# Patient Record
Sex: Male | Born: 1965 | Race: Black or African American | Hispanic: No | Marital: Married | Smoking: Never smoker
Health system: Southern US, Community
[De-identification: ages and names within clinical notes are randomized; demographics above are authoritative.]

## PROBLEM LIST (undated history)

## (undated) DIAGNOSIS — T7840XA Allergy, unspecified, initial encounter: Secondary | ICD-10-CM

## (undated) DIAGNOSIS — E785 Hyperlipidemia, unspecified: Secondary | ICD-10-CM

## (undated) DIAGNOSIS — M2550 Pain in unspecified joint: Secondary | ICD-10-CM

## (undated) DIAGNOSIS — M109 Gout, unspecified: Secondary | ICD-10-CM

## (undated) DIAGNOSIS — I10 Essential (primary) hypertension: Secondary | ICD-10-CM

## (undated) DIAGNOSIS — I509 Heart failure, unspecified: Secondary | ICD-10-CM

## (undated) HISTORY — DX: Hyperlipidemia, unspecified: E78.5

## (undated) HISTORY — DX: Heart failure, unspecified: I50.9

## (undated) HISTORY — DX: Essential (primary) hypertension: I10

## (undated) HISTORY — DX: Pain in unspecified joint: M25.50

## (undated) HISTORY — PX: TONSILLECTOMY: SHX5217

## (undated) HISTORY — DX: Allergy, unspecified, initial encounter: T78.40XA

---

## 2015-05-31 ENCOUNTER — Telehealth: Payer: Self-pay | Admitting: *Deleted

## 2015-05-31 NOTE — Telephone Encounter (Signed)
Unable to reach patient at time of Pre-Visit Call.  Left message for patient to return call when available.    

## 2015-06-01 ENCOUNTER — Encounter: Payer: Self-pay | Admitting: Family Medicine

## 2015-06-01 ENCOUNTER — Ambulatory Visit (INDEPENDENT_AMBULATORY_CARE_PROVIDER_SITE_OTHER): Payer: BC Managed Care – PPO | Admitting: Family Medicine

## 2015-06-01 VITALS — BP 138/90 | HR 97 | Temp 98.1°F | Ht 69.0 in | Wt 235.6 lb

## 2015-06-01 DIAGNOSIS — I10 Essential (primary) hypertension: Secondary | ICD-10-CM | POA: Insufficient documentation

## 2015-06-01 DIAGNOSIS — Z23 Encounter for immunization: Secondary | ICD-10-CM

## 2015-06-01 DIAGNOSIS — IMO0001 Reserved for inherently not codable concepts without codable children: Secondary | ICD-10-CM

## 2015-06-01 DIAGNOSIS — R9431 Abnormal electrocardiogram [ECG] [EKG]: Secondary | ICD-10-CM

## 2015-06-01 DIAGNOSIS — R030 Elevated blood-pressure reading, without diagnosis of hypertension: Secondary | ICD-10-CM

## 2015-06-01 DIAGNOSIS — R319 Hematuria, unspecified: Secondary | ICD-10-CM

## 2015-06-01 DIAGNOSIS — Z0000 Encounter for general adult medical examination without abnormal findings: Secondary | ICD-10-CM | POA: Insufficient documentation

## 2015-06-01 HISTORY — DX: Encounter for general adult medical examination without abnormal findings: Z00.00

## 2015-06-01 MED ORDER — LISINOPRIL-HYDROCHLOROTHIAZIDE 10-12.5 MG PO TABS: ORAL_TABLET | ORAL | Status: DC

## 2015-06-01 NOTE — Progress Notes (Signed)
Patient ID: Alfred Martin, male    DOB: 09/06/1966  Age: 49 y.o. MRN: [id number]    Subjective:  Subjective HPI Alfred Martin presents to establish -- bp has been running high at  Home.    Review of Systems  Constitutional: Negative for diaphoresis, appetite change, fatigue and unexpected weight change.  Eyes: Negative for pain, redness and visual disturbance.  Respiratory: Negative for cough, chest tightness, shortness of breath and wheezing.   Cardiovascular: Negative for chest pain, palpitations and leg swelling.  Endocrine: Negative for cold intolerance, heat intolerance, polydipsia, polyphagia and polyuria.  Genitourinary: Negative for dysuria, frequency and difficulty urinating.  Neurological: Negative for dizziness, light-headedness, numbness and headaches.    History Past Medical History  Diagnosis Date  . HTN (hypertension)     He has past surgical history that includes Tonsillectomy.   His family history includes Arthritis in his mother; Breast cancer in his maternal aunt and paternal grandmother; Colon cancer in his paternal uncle; Congestive Heart Failure in his brother and paternal uncle; Diabetes in his brother, father, and mother; Drug abuse in his brother; Heart disease in his maternal grandmother; High blood pressure in his father and mother; Hyperlipidemia in his father and mother; Hypertension in his brother, brother, father, and mother; Stroke in his paternal uncle.He reports that he has never smoked. He has never used smokeless tobacco. He reports that he does not drink alcohol or use illicit drugs.  No current outpatient prescriptions on file prior to visit.   No current facility-administered medications on file prior to visit.     Objective:  Objective Physical Exam  Constitutional: He is oriented to person, place, and time. Vital signs are normal. He appears well-developed and well-nourished. He is sleeping. No distress.  HENT:  Head: Normocephalic and atraumatic.   Right Ear: External ear normal.  Left Ear: External ear normal.  Nose: Nose normal.  Mouth/Throat: Oropharynx is clear and moist. No oropharyngeal exudate.  Eyes: Conjunctivae and EOM are normal. Pupils are equal, round, and reactive to light. Right eye exhibits no discharge. Left eye exhibits no discharge.  Neck: Normal range of motion. Neck supple. No JVD present. No thyromegaly present.  Cardiovascular: Normal rate, regular rhythm and intact distal pulses.  Exam reveals no gallop and no friction rub.   No murmur heard. Pulmonary/Chest: Effort normal and breath sounds normal. No respiratory distress. He has no wheezes. He has no rales. He exhibits no tenderness.  Abdominal: Soft. Bowel sounds are normal. He exhibits no distension and no mass. There is no tenderness. There is no rebound and no guarding.  Genitourinary:  Pt refused rectal exam  Musculoskeletal: Normal range of motion. He exhibits no edema or tenderness.  Lymphadenopathy:    He has no cervical adenopathy.  Neurological: He is alert and oriented to person, place, and time. He displays normal reflexes. He exhibits normal muscle tone.  Skin: Skin is warm and dry. No rash noted. He is not diaphoretic. No erythema. No pallor.  Psychiatric: He has a normal mood and affect. His behavior is normal. Judgment and thought content normal.   BP 138/90 mmHg  Pulse 97  Temp(Src) 98.1 F (36.7 C) (Oral)  Ht 5\' 9"  (1.753 m)  Wt 235 lb 9.6 oz (106.867 kg)  BMI 34.78 kg/m2  SpO2 98% Wt Readings from Last 3 Encounters:  06/01/15 235 lb 9.6 oz (106.867 kg)     No results found for: WBC, HGB, HCT, PLT, GLUCOSE, CHOL, TRIG, HDL, LDLDIRECT, LDLCALC, ALT, AST,  NA, K, CL, CREATININE, BUN, CO2, TSH, PSA, INR, GLUF, HGBA1C, MICROALBUR ekg- possible ischemia-- x2-- no old one to compare    Assessment & Plan:  Plan I am having Mr. Haughey start on lisinopril-hydrochlorothiazide.  Meds ordered this encounter  Medications  .  lisinopril-hydrochlorothiazide (PRINZIDE,ZESTORETIC) 10-12.5 MG per tablet    Sig: 1 po qd    Dispense:  30 tablet    Refill:  2    Problem List Items Addressed This Visit    HTN (hypertension)   Relevant Medications   lisinopril-hydrochlorothiazide (PRINZIDE,ZESTORETIC) 10-12.5 MG per tablet    Other Visit Diagnoses    Preventative health care    -  Primary    Relevant Orders    Basic metabolic panel    CBC with Differential/Platelet    Hepatic function panel    Lipid panel    POCT urinalysis dipstick    PSA    TSH    Elevated BP        Relevant Medications    lisinopril-hydrochlorothiazide (PRINZIDE,ZESTORETIC) 10-12.5 MG per tablet    Other Relevant Orders    EKG 12-Lead (Completed)    ECHOCARDIOGRAM COMPLETE    6 minute walk    Myocardial Perfusion Imaging    Need for prophylactic vaccination with combined diphtheria-tetanus-pertussis (DTP) vaccine        Relevant Orders    Tdap vaccine greater than or equal to 7yo IM (Completed)    Nonspecific abnormal electrocardiogram (ECG) (EKG)        Relevant Orders    Myocardial Perfusion Imaging       Follow-up: Return in about 3 months (around 09/01/2015), or if symptoms worsen or fail to improve, for hypertension.  Alfred Koyanagi, DO

## 2015-06-01 NOTE — Progress Notes (Signed)
Pre visit review using our clinic review tool, if applicable. No additional management support is needed unless otherwise documented below in the visit note. 

## 2015-06-01 NOTE — Patient Instructions (Signed)
Preventive Care for Adults A healthy lifestyle and preventive care can promote health and wellness. Preventive health guidelines for men include the following key practices:  A routine yearly physical is a good way to check with your health care provider about your health and preventative screening. It is a chance to share any concerns and updates on your health and to receive a thorough exam.  Visit your dentist for a routine exam and preventative care every 6 months. Brush your teeth twice a day and floss once a day. Good oral hygiene prevents tooth decay and gum disease.  The frequency of eye exams is based on your age, health, family medical history, use of contact lenses, and other factors. Follow your health care provider's recommendations for frequency of eye exams.  Eat a healthy diet. Foods such as vegetables, fruits, whole grains, low-fat dairy products, and lean protein foods contain the nutrients you need without too many calories. Decrease your intake of foods high in solid fats, added sugars, and salt. Eat the right amount of calories for you.Get information about a proper diet from your health care provider, if necessary.  Regular physical exercise is one of the most important things you can do for your health. Most adults should get at least 150 minutes of moderate-intensity exercise (any activity that increases your heart rate and causes you to sweat) each week. In addition, most adults need muscle-strengthening exercises on 2 or more days a week.  Maintain a healthy weight. The body mass index (BMI) is a screening tool to identify possible weight problems. It provides an estimate of body fat based on height and weight. Your health care provider can find your BMI and can help you achieve or maintain a healthy weight.For adults 20 years and older:  A BMI below 18.5 is considered underweight.  A BMI of 18.5 to 24.9 is normal.  A BMI of 25 to 29.9 is considered overweight.  A BMI  of 30 and above is considered obese.  Maintain normal blood lipids and cholesterol levels by exercising and minimizing your intake of saturated fat. Eat a balanced diet with plenty of fruit and vegetables. Blood tests for lipids and cholesterol should begin at age 50 and be repeated every 5 years. If your lipid or cholesterol levels are high, you are over 50, or you are at high risk for heart disease, you may need your cholesterol levels checked more frequently.Ongoing high lipid and cholesterol levels should be treated with medicines if diet and exercise are not working.  If you smoke, find out from your health care provider how to quit. If you do not use tobacco, do not start.  Lung cancer screening is recommended for adults aged 73-80 years who are at high risk for developing lung cancer because of a history of smoking. A yearly low-dose CT scan of the lungs is recommended for people who have at least a 30-pack-year history of smoking and are a current smoker or have quit within the past 15 years. A pack year of smoking is smoking an average of 1 pack of cigarettes a day for 1 year (for example: 1 pack a day for 30 years or 2 packs a day for 15 years). Yearly screening should continue until the smoker has stopped smoking for at least 15 years. Yearly screening should be stopped for people who develop a health problem that would prevent them from having lung cancer treatment.  If you choose to drink alcohol, do not have more than  2 drinks per day. One drink is considered to be 12 ounces (355 mL) of beer, 5 ounces (148 mL) of wine, or 1.5 ounces (44 mL) of liquor.  Avoid use of street drugs. Do not share needles with anyone. Ask for help if you need support or instructions about stopping the use of drugs.  High blood pressure causes heart disease and increases the risk of stroke. Your blood pressure should be checked at least every 1-2 years. Ongoing high blood pressure should be treated with  medicines, if weight loss and exercise are not effective.  If you are 45-79 years old, ask your health care provider if you should take aspirin to prevent heart disease.  Diabetes screening involves taking a blood sample to check your fasting blood sugar level. This should be done once every 3 years, after age 45, if you are within normal weight and without risk factors for diabetes. Testing should be considered at a younger age or be carried out more frequently if you are overweight and have at least 1 risk factor for diabetes.  Colorectal cancer can be detected and often prevented. Most routine colorectal cancer screening begins at the age of 50 and continues through age 75. However, your health care provider may recommend screening at an earlier age if you have risk factors for colon cancer. On a yearly basis, your health care provider may provide home test kits to check for hidden blood in the stool. Use of a small camera at the end of a tube to directly examine the colon (sigmoidoscopy or colonoscopy) can detect the earliest forms of colorectal cancer. Talk to your health care provider about this at age 50, when routine screening begins. Direct exam of the colon should be repeated every 5-10 years through age 75, unless early forms of precancerous polyps or small growths are found.  People who are at an increased risk for hepatitis B should be screened for this virus. You are considered at high risk for hepatitis B if:  You were born in a country where hepatitis B occurs often. Talk with your health care provider about which countries are considered high risk.  Your parents were born in a high-risk country and you have not received a shot to protect against hepatitis B (hepatitis B vaccine).  You have HIV or AIDS.  You use needles to inject street drugs.  You live with, or have sex with, someone who has hepatitis B.  You are a man who has sex with other men (MSM).  You get hemodialysis  treatment.  You take certain medicines for conditions such as cancer, organ transplantation, and autoimmune conditions.  Hepatitis C blood testing is recommended for all people born from 1945 through 1965 and any individual with known risks for hepatitis C.  Practice safe sex. Use condoms and avoid high-risk sexual practices to reduce the spread of sexually transmitted infections (STIs). STIs include gonorrhea, chlamydia, syphilis, trichomonas, herpes, HPV, and human immunodeficiency virus (HIV). Herpes, HIV, and HPV are viral illnesses that have no cure. They can result in disability, cancer, and death.  If you are at risk of being infected with HIV, it is recommended that you take a prescription medicine daily to prevent HIV infection. This is called preexposure prophylaxis (PrEP). You are considered at risk if:  You are a man who has sex with other men (MSM) and have other risk factors.  You are a heterosexual man, are sexually active, and are at increased risk for HIV infection.    You take drugs by injection.  You are sexually active with a partner who has HIV.  Talk with your health care provider about whether you are at high risk of being infected with HIV. If you choose to begin PrEP, you should first be tested for HIV. You should then be tested every 3 months for as long as you are taking PrEP.  A one-time screening for abdominal aortic aneurysm (AAA) and surgical repair of large AAAs by ultrasound are recommended for men ages 32 to 67 years who are current or former smokers.  Healthy men should no longer receive prostate-specific antigen (PSA) blood tests as part of routine cancer screening. Talk with your health care provider about prostate cancer screening.  Testicular cancer screening is not recommended for adult males who have no symptoms. Screening includes self-exam, a health care provider exam, and other screening tests. Consult with your health care provider about any symptoms  you have or any concerns you have about testicular cancer.  Use sunscreen. Apply sunscreen liberally and repeatedly throughout the day. You should seek shade when your shadow is shorter than you. Protect yourself by wearing long sleeves, pants, a wide-brimmed hat, and sunglasses year round, whenever you are outdoors.  Once a month, do a whole-body skin exam, using a mirror to look at the skin on your back. Tell your health care provider about new moles, moles that have irregular borders, moles that are larger than a pencil eraser, or moles that have changed in shape or color.  Stay current with required vaccines (immunizations).  Influenza vaccine. All adults should be immunized every year.  Tetanus, diphtheria, and acellular pertussis (Td, Tdap) vaccine. An adult who has not previously received Tdap or who does not know his vaccine status should receive 1 dose of Tdap. This initial dose should be followed by tetanus and diphtheria toxoids (Td) booster doses every 10 years. Adults with an unknown or incomplete history of completing a 3-dose immunization series with Td-containing vaccines should begin or complete a primary immunization series including a Tdap dose. Adults should receive a Td booster every 10 years.  Varicella vaccine. An adult without evidence of immunity to varicella should receive 2 doses or a second dose if he has previously received 1 dose.  Human papillomavirus (HPV) vaccine. Males aged 68-21 years who have not received the vaccine previously should receive the 3-dose series. Males aged 22-26 years may be immunized. Immunization is recommended through the age of 6 years for any male who has sex with males and did not get any or all doses earlier. Immunization is recommended for any person with an immunocompromised condition through the age of 49 years if he did not get any or all doses earlier. During the 3-dose series, the second dose should be obtained 4-8 weeks after the first  dose. The third dose should be obtained 24 weeks after the first dose and 16 weeks after the second dose.  Zoster vaccine. One dose is recommended for adults aged 50 years or older unless certain conditions are present.  Measles, mumps, and rubella (MMR) vaccine. Adults born before [birth year] generally are considered immune to measles and mumps. Adults born in [birth year] or later should have 1 or more doses of MMR vaccine unless there is a contraindication to the vaccine or there is laboratory evidence of immunity to each of the three diseases. A routine second dose of MMR vaccine should be obtained at least 28 days after the first dose for students attending postsecondary  schools, health care workers, or international travelers. People who received inactivated measles vaccine or an unknown type of measles vaccine during 1963-1967 should receive 2 doses of MMR vaccine. People who received inactivated mumps vaccine or an unknown type of mumps vaccine before 1979 and are at high risk for mumps infection should consider immunization with 2 doses of MMR vaccine. Unvaccinated health care workers born before 1957 who lack laboratory evidence of measles, mumps, or rubella immunity or laboratory confirmation of disease should consider measles and mumps immunization with 2 doses of MMR vaccine or rubella immunization with 1 dose of MMR vaccine.  Pneumococcal 13-valent conjugate (PCV13) vaccine. When indicated, a person who is uncertain of his immunization history and has no record of immunization should receive the PCV13 vaccine. An adult aged 19 years or older who has certain medical conditions and has not been previously immunized should receive 1 dose of PCV13 vaccine. This PCV13 should be followed with a dose of pneumococcal polysaccharide (PPSV23) vaccine. The PPSV23 vaccine dose should be obtained at least 8 weeks after the dose of PCV13 vaccine. An adult aged 19 years or older who has certain medical conditions and  previously received 1 or more doses of PPSV23 vaccine should receive 1 dose of PCV13. The PCV13 vaccine dose should be obtained 1 or more years after the last PPSV23 vaccine dose.  Pneumococcal polysaccharide (PPSV23) vaccine. When PCV13 is also indicated, PCV13 should be obtained first. All adults aged 65 years and older should be immunized. An adult younger than age 65 years who has certain medical conditions should be immunized. Any person who resides in a nursing home or long-term care facility should be immunized. An adult smoker should be immunized. People with an immunocompromised condition and certain other conditions should receive both PCV13 and PPSV23 vaccines. People with human immunodeficiency virus (HIV) infection should be immunized as soon as possible after diagnosis. Immunization during chemotherapy or radiation therapy should be avoided. Routine use of PPSV23 vaccine is not recommended for American Indians, [place] Natives, or people younger than 65 years unless there are medical conditions that require PPSV23 vaccine. When indicated, people who have unknown immunization and have no record of immunization should receive PPSV23 vaccine. One-time revaccination 5 years after the first dose of PPSV23 is recommended for people aged 19-64 years who have chronic kidney failure, nephrotic syndrome, asplenia, or immunocompromised conditions. People who received 1-2 doses of PPSV23 before age 65 years should receive another dose of PPSV23 vaccine at age 65 years or later if at least 5 years have passed since the previous dose. Doses of PPSV23 are not needed for people immunized with PPSV23 at or after age 65 years.  Meningococcal vaccine. Adults with asplenia or persistent complement component deficiencies should receive 2 doses of quadrivalent meningococcal conjugate (MenACWY-D) vaccine. The doses should be obtained at least 2 months apart. Microbiologists working with certain meningococcal bacteria,  military recruits, people at risk during an outbreak, and people who travel to or live in countries with a high rate of meningitis should be immunized. A first-year college student up through age 21 years who is living in a residence hall should receive a dose if he did not receive a dose on or after his 16th birthday. Adults who have certain high-risk conditions should receive one or more doses of vaccine.  Hepatitis A vaccine. Adults who wish to be protected from this disease, have certain high-risk conditions, work with hepatitis A-infected animals, work in hepatitis A research labs, or   travel to or work in countries with a high rate of hepatitis A should be immunized. Adults who were previously unvaccinated and who anticipate close contact with an international adoptee during the first 60 days after arrival in the [place] from a country with a high rate of hepatitis A should be immunized.  Hepatitis B vaccine. Adults should be immunized if they wish to be protected from this disease, have certain high-risk conditions, may be exposed to blood or other infectious body fluids, are household contacts or sex partners of hepatitis B positive people, are clients or workers in certain care facilities, or travel to or work in countries with a high rate of hepatitis B.  Haemophilus influenzae type b (Hib) vaccine. A previously unvaccinated person with asplenia or sickle cell disease or having a scheduled splenectomy should receive 1 dose of Hib vaccine. Regardless of previous immunization, a recipient of a hematopoietic stem cell transplant should receive a 3-dose series 6-12 months after his successful transplant. Hib vaccine is not recommended for adults with HIV infection. Preventive Service / Frequency Ages 52 to 17  Blood pressure check.** / Every 1 to 2 years.  Lipid and cholesterol check.** / Every 5 years beginning at age 69.  Hepatitis C blood test.** / For any individual with known risks for  hepatitis C.  Skin self-exam. / Monthly.  Influenza vaccine. / Every year.  Tetanus, diphtheria, and acellular pertussis (Tdap, Td) vaccine.** / Consult your health care provider. 1 dose of Td every 10 years.  Varicella vaccine.** / Consult your health care provider.  HPV vaccine. / 3 doses over 6 months, if 72 or younger.  Measles, mumps, rubella (MMR) vaccine.** / You need at least 1 dose of MMR if you were born in 1957 or later. You may also need a second dose.  Pneumococcal 13-valent conjugate (PCV13) vaccine.** / Consult your health care provider.  Pneumococcal polysaccharide (PPSV23) vaccine.** / 1 to 2 doses if you smoke cigarettes or if you have certain conditions.  Meningococcal vaccine.** / 1 dose if you are age 35 to 60 years and a Market researcher living in a residence hall, or have one of several medical conditions. You may also need additional booster doses.  Hepatitis A vaccine.** / Consult your health care provider.  Hepatitis B vaccine.** / Consult your health care provider.  Haemophilus influenzae type b (Hib) vaccine.** / Consult your health care provider. Ages 35 to 8  Blood pressure check.** / Every 1 to 2 years.  Lipid and cholesterol check.** / Every 5 years beginning at age 57.  Lung cancer screening. / Every year if you are aged 44-80 years and have a 30-pack-year history of smoking and currently smoke or have quit within the past 15 years. Yearly screening is stopped once you have quit smoking for at least 15 years or develop a health problem that would prevent you from having lung cancer treatment.  Fecal occult blood test (FOBT) of stool. / Every year beginning at age 55 and continuing until age 73. You may not have to do this test if you get a colonoscopy every 10 years.  Flexible sigmoidoscopy** or colonoscopy.** / Every 5 years for a flexible sigmoidoscopy or every 10 years for a colonoscopy beginning at age 28 and continuing until age  1.  Hepatitis C blood test.** / For all people born from [birth year] through 1965 and any individual with known risks for hepatitis C.  Skin self-exam. / Monthly.  Influenza vaccine. / Every  year.  Tetanus, diphtheria, and acellular pertussis (Tdap/Td) vaccine.** / Consult your health care provider. 1 dose of Td every 10 years.  Varicella vaccine.** / Consult your health care provider.  Zoster vaccine.** / 1 dose for adults aged 53 years or older.  Measles, mumps, rubella (MMR) vaccine.** / You need at least 1 dose of MMR if you were born in 1957 or later. You may also need a second dose.  Pneumococcal 13-valent conjugate (PCV13) vaccine.** / Consult your health care provider.  Pneumococcal polysaccharide (PPSV23) vaccine.** / 1 to 2 doses if you smoke cigarettes or if you have certain conditions.  Meningococcal vaccine.** / Consult your health care provider.  Hepatitis A vaccine.** / Consult your health care provider.  Hepatitis B vaccine.** / Consult your health care provider.  Haemophilus influenzae type b (Hib) vaccine.** / Consult your health care provider. Ages 77 and over  Blood pressure check.** / Every 1 to 2 years.  Lipid and cholesterol check.**/ Every 5 years beginning at age 85.  Lung cancer screening. / Every year if you are aged 55-80 years and have a 30-pack-year history of smoking and currently smoke or have quit within the past 15 years. Yearly screening is stopped once you have quit smoking for at least 15 years or develop a health problem that would prevent you from having lung cancer treatment.  Fecal occult blood test (FOBT) of stool. / Every year beginning at age 33 and continuing until age 11. You may not have to do this test if you get a colonoscopy every 10 years.  Flexible sigmoidoscopy** or colonoscopy.** / Every 5 years for a flexible sigmoidoscopy or every 10 years for a colonoscopy beginning at age 28 and continuing until age 73.  Hepatitis C blood  test.** / For all people born from [birth year] through 1965 and any individual with known risks for hepatitis C.  Abdominal aortic aneurysm (AAA) screening.** / A one-time screening for ages 50 to 27 years who are current or former smokers.  Skin self-exam. / Monthly.  Influenza vaccine. / Every year.  Tetanus, diphtheria, and acellular pertussis (Tdap/Td) vaccine.** / 1 dose of Td every 10 years.  Varicella vaccine.** / Consult your health care provider.  Zoster vaccine.** / 1 dose for adults aged 34 years or older.  Pneumococcal 13-valent conjugate (PCV13) vaccine.** / Consult your health care provider.  Pneumococcal polysaccharide (PPSV23) vaccine.** / 1 dose for all adults aged 63 years and older.  Meningococcal vaccine.** / Consult your health care provider.  Hepatitis A vaccine.** / Consult your health care provider.  Hepatitis B vaccine.** / Consult your health care provider.  Haemophilus influenzae type b (Hib) vaccine.** / Consult your health care provider. **Family history and personal history of risk and conditions may change your health care provider's recommendations. Document Released: 01/14/2002 Document Revised: 11/23/2013 Document Reviewed: 04/15/2011 [facility] Patient Information 2015 [name], [place]. This information is not intended to replace advice given to you by your health care provider. Make sure you discuss any questions you have with your health care provider.

## 2015-06-01 NOTE — Assessment & Plan Note (Signed)
ghm utd Check labs See AVS 

## 2015-06-02 LAB — POCT URINALYSIS DIPSTICK
Bilirubin, UA: NEGATIVE
GLUCOSE UA: NEGATIVE
KETONES UA: NEGATIVE
Leukocytes, UA: NEGATIVE
Nitrite, UA: NEGATIVE
Urobilinogen, UA: 4
pH, UA: 6

## 2015-06-02 LAB — HEPATIC FUNCTION PANEL
ALT: 35 U/L (ref 0–53)
AST: 29 U/L (ref 0–37)
Albumin: 4 g/dL (ref 3.5–5.2)
Alkaline Phosphatase: 47 U/L (ref 39–117)
Bilirubin, Direct: 0.1 mg/dL (ref 0.0–0.3)
TOTAL PROTEIN: 7.1 g/dL (ref 6.0–8.3)
Total Bilirubin: 0.4 mg/dL (ref 0.2–1.2)

## 2015-06-02 LAB — CBC WITH DIFFERENTIAL/PLATELET
BASOS ABS: 0 10*3/uL (ref 0.0–0.1)
BASOS PCT: 0.7 % (ref 0.0–3.0)
EOS PCT: 1.8 % (ref 0.0–5.0)
Eosinophils Absolute: 0.1 10*3/uL (ref 0.0–0.7)
HCT: 44.4 % (ref 39.0–52.0)
Hemoglobin: 14.4 g/dL (ref 13.0–17.0)
LYMPHS ABS: 3.1 10*3/uL (ref 0.7–4.0)
LYMPHS PCT: 49.1 % — AB (ref 12.0–46.0)
MCHC: 32.4 g/dL (ref 30.0–36.0)
MCV: 83.2 fl (ref 78.0–100.0)
MONO ABS: 0.7 10*3/uL (ref 0.1–1.0)
MONOS PCT: 10.7 % (ref 3.0–12.0)
NEUTROS ABS: 2.4 10*3/uL (ref 1.4–7.7)
Neutrophils Relative %: 37.7 % — ABNORMAL LOW (ref 43.0–77.0)
Platelets: 143 10*3/uL — ABNORMAL LOW (ref 150.0–400.0)
RBC: 5.34 Mil/uL (ref 4.22–5.81)
RDW: 13.8 % (ref 11.5–15.5)
WBC: 6.3 10*3/uL (ref 4.0–10.5)

## 2015-06-02 LAB — PSA: PSA: 1.06 ng/mL (ref 0.10–4.00)

## 2015-06-02 LAB — BASIC METABOLIC PANEL
BUN: 16 mg/dL (ref 6–23)
CALCIUM: 9.3 mg/dL (ref 8.4–10.5)
CO2: 26 mEq/L (ref 19–32)
CREATININE: 1.36 mg/dL (ref 0.40–1.50)
Chloride: 105 mEq/L (ref 96–112)
GFR: 71.23 mL/min (ref >60.00)
Glucose, Bld: 86 mg/dL (ref 70–99)
Potassium: 4.1 mEq/L (ref 3.5–5.1)
Sodium: 141 mEq/L (ref 135–145)

## 2015-06-02 LAB — LIPID PANEL
CHOLESTEROL: 197 mg/dL (ref 0–200)
HDL: 47.7 mg/dL (ref >39.00)
LDL Cholesterol: 126 mg/dL — ABNORMAL HIGH (ref 0–99)
NonHDL: 149.3
Total CHOL/HDL Ratio: 4
Triglycerides: 119 mg/dL (ref 0.0–149.0)
VLDL: 23.8 mg/dL (ref 0.0–40.0)

## 2015-06-02 LAB — TSH: TSH: 1.8 u[IU]/mL (ref 0.35–4.50)

## 2015-06-02 NOTE — Addendum Note (Signed)
Addended by: Modena Morrow D on: 06/02/2015 10:05 AM   Modules accepted: Orders

## 2015-06-05 LAB — URINE CULTURE: Colony Count: >=100000

## 2015-06-07 ENCOUNTER — Telehealth: Payer: Self-pay | Admitting: Family

## 2015-06-07 MED ORDER — SULFAMETHOXAZOLE-TRIMETHOPRIM 800-160 MG PO TABS: 1.0000 | ORAL_TABLET | Freq: Two times a day (BID) | ORAL | Status: DC

## 2015-06-07 NOTE — Telephone Encounter (Signed)
Patient notified, voiced understanding of recommendations.

## 2015-06-07 NOTE — Telephone Encounter (Signed)
Urine culture shows bacteria, Advise pt start bactrim. Call if dysuria/frequency fever after completion of abx.

## 2015-06-13 ENCOUNTER — Telehealth (HOSPITAL_COMMUNITY): Payer: Self-pay

## 2015-06-13 NOTE — Telephone Encounter (Signed)
Left message on voicemail in reference to upcoming appointment scheduled for 06-15-2015. Phone number given for a call back so details instructions can be given. Oletta Lamas, Jonnathan Birman A

## 2015-06-14 ENCOUNTER — Telehealth (HOSPITAL_COMMUNITY): Payer: Self-pay

## 2015-06-14 NOTE — Telephone Encounter (Signed)
-----   Message from Irven Baltimore sent at 06/14/2015  9:04 AM EDT ----- Please call Patsy with Nuclear Medicine regarding this patient. [phone]

## 2015-06-14 NOTE — Telephone Encounter (Signed)
Patient given detailed instructions per Myocardial Perfusion Study Information Sheet for test on 06-15-2015 at 0915. Patient does not want the Lexiscan. He wants to do the nuclear treadmill tomorrow. The patient sprained his ankle recently, but it is getting better per patient.Patient thinks he can reach his target heart rate, but does not want lexiscan. I told the patient I would call Dr. Nonda Lou office. Patient Notified to arrive 15 minutes early, and that it is imperative to arrive on time for appointment to keep from having the test rescheduled. Patient verbalized understanding. Oletta Lamas, Trashaun Streight A

## 2015-06-14 NOTE — Telephone Encounter (Signed)
I called Dr.Lowne's office regarding a [place] Nuclear Study scheduled for tomorrow. I spoke with the patient, and he wants to do the nuclear treadmill instead of the [place]. The patient sprained his ankle recently, but it is getting better. He does not want the lexiscan even if target heart rate is not reached, he thinks he will reach his  target heart rate. He does not want to reschedule. Irven Baltimore, RN

## 2015-06-14 NOTE — Telephone Encounter (Signed)
I spoke with Joelene Millin at Dr. Nonda Lou office who is going to call the patient to see if he will do the [place], but if not Dr. Etter Sjogren is ok for the patient to do the treadmill. Irven Baltimore, RN

## 2015-06-14 NOTE — Telephone Encounter (Signed)
I spoke with Alfred Martin and she advised the patient does not want the medicine, says he wants to have the Treadmill test w/o the Lexiscan. I called Dr.Lowne and made her aware, she said she would prefer the patient have the Lexiscan, however she can not force him to have it, she wanted me to make the patient aware that the Treadmill alone will not be able to proved a clear picture of what could possibly be going on and if it is abnormal we would more than likely end up doing a Lexiscan anyway. I have tried to contact the patient and left him  message to call the office.     KP

## 2015-06-15 ENCOUNTER — Ambulatory Visit (HOSPITAL_COMMUNITY): Payer: BC Managed Care – PPO | Attending: Internal Medicine

## 2015-06-15 ENCOUNTER — Other Ambulatory Visit: Payer: Self-pay

## 2015-06-15 ENCOUNTER — Encounter (HOSPITAL_COMMUNITY): Payer: Self-pay | Admitting: *Deleted

## 2015-06-15 ENCOUNTER — Ambulatory Visit (HOSPITAL_COMMUNITY): Payer: BC Managed Care – PPO

## 2015-06-15 DIAGNOSIS — I517 Cardiomegaly: Secondary | ICD-10-CM | POA: Diagnosis not present

## 2015-06-15 DIAGNOSIS — IMO0001 Reserved for inherently not codable concepts without codable children: Secondary | ICD-10-CM

## 2015-06-15 DIAGNOSIS — I10 Essential (primary) hypertension: Secondary | ICD-10-CM | POA: Diagnosis present

## 2015-06-15 DIAGNOSIS — R030 Elevated blood-pressure reading, without diagnosis of hypertension: Secondary | ICD-10-CM | POA: Diagnosis not present

## 2015-06-15 DIAGNOSIS — I340 Nonrheumatic mitral (valve) insufficiency: Secondary | ICD-10-CM | POA: Insufficient documentation

## 2015-06-15 NOTE — Progress Notes (Signed)
Patient had no previous echos and no cardiologist as he was referred from Dr. Etter Sjogren at [facility]. EF of 20-25% with no symptoms. Dr. Mare Ferrari (DOD) was notified. He instructed to send him home and have Dr. Etter Sjogren refer him to cardiology for consult.

## 2015-06-15 NOTE — Progress Notes (Signed)
Patient ID: Alfred Martin, male   DOB: 1966/05/25, 48 y.o.   MRN: [id number] Patient scheduled for nuclear study. Nuclear cancelled per Dr. Mare Ferrari due to Abnormal Echo. Hubbard Robinson, RN

## 2015-06-16 ENCOUNTER — Other Ambulatory Visit: Payer: Self-pay

## 2015-06-16 DIAGNOSIS — I5189 Other ill-defined heart diseases: Secondary | ICD-10-CM

## 2015-06-16 DIAGNOSIS — I517 Cardiomegaly: Secondary | ICD-10-CM

## 2015-06-16 DIAGNOSIS — R931 Abnormal findings on diagnostic imaging of heart and coronary circulation: Secondary | ICD-10-CM

## 2015-06-18 ENCOUNTER — Encounter: Payer: Self-pay | Admitting: Family Medicine

## 2015-06-20 ENCOUNTER — Ambulatory Visit (INDEPENDENT_AMBULATORY_CARE_PROVIDER_SITE_OTHER): Payer: BC Managed Care – PPO | Admitting: Internal Medicine

## 2015-06-20 DIAGNOSIS — R0600 Dyspnea, unspecified: Secondary | ICD-10-CM

## 2015-06-20 DIAGNOSIS — R030 Elevated blood-pressure reading, without diagnosis of hypertension: Secondary | ICD-10-CM | POA: Diagnosis not present

## 2015-06-20 DIAGNOSIS — R0609 Other forms of dyspnea: Secondary | ICD-10-CM

## 2015-06-20 DIAGNOSIS — IMO0001 Reserved for inherently not codable concepts without codable children: Secondary | ICD-10-CM

## 2015-06-20 HISTORY — DX: Other forms of dyspnea: R06.09

## 2015-06-20 HISTORY — DX: Dyspnea, unspecified: R06.00

## 2015-06-20 NOTE — Progress Notes (Signed)
Procedure interpretation documented for 6 Minute Walk Test

## 2015-06-20 NOTE — Patient Instructions (Signed)
Documentation for 6 Minute Walk Test

## 2015-06-21 ENCOUNTER — Encounter: Payer: Self-pay | Admitting: Family Medicine

## 2015-06-21 ENCOUNTER — Other Ambulatory Visit: Payer: Self-pay | Admitting: Family Medicine

## 2015-06-21 DIAGNOSIS — I10 Essential (primary) hypertension: Secondary | ICD-10-CM

## 2015-06-21 MED ORDER — LISINOPRIL-HYDROCHLOROTHIAZIDE 20-25 MG PO TABS: 1.0000 | ORAL_TABLET | Freq: Every day | ORAL | Status: DC

## 2015-06-22 ENCOUNTER — Encounter: Payer: Self-pay | Admitting: Family Medicine

## 2015-07-22 ENCOUNTER — Encounter: Payer: Self-pay | Admitting: Family Medicine

## 2015-07-24 NOTE — Telephone Encounter (Signed)
He can come in to leave a urine specimen

## 2015-07-27 DIAGNOSIS — R931 Abnormal findings on diagnostic imaging of heart and coronary circulation: Secondary | ICD-10-CM | POA: Insufficient documentation

## 2015-07-27 DIAGNOSIS — I429 Cardiomyopathy, unspecified: Secondary | ICD-10-CM | POA: Insufficient documentation

## 2015-07-27 HISTORY — DX: Abnormal findings on diagnostic imaging of heart and coronary circulation: R93.1

## 2015-07-27 HISTORY — DX: Cardiomyopathy, unspecified: I42.9

## 2015-08-08 NOTE — Telephone Encounter (Signed)
Patient did not read his e-mail. I called the patient to discuss and he said he was doing better, No concerns at this time.   KP

## 2015-08-25 ENCOUNTER — Other Ambulatory Visit: Payer: Self-pay | Admitting: Family Medicine

## 2015-08-25 ENCOUNTER — Ambulatory Visit (HOSPITAL_BASED_OUTPATIENT_CLINIC_OR_DEPARTMENT_OTHER)
Admission: RE | Admit: 2015-08-25 | Discharge: 2015-08-25 | Disposition: A | Discharge status: Home / Self Care | Payer: BC Managed Care – PPO | Source: Ambulatory Visit | Attending: Family Medicine | Admitting: Family Medicine

## 2015-08-25 ENCOUNTER — Encounter: Payer: Self-pay | Admitting: Family Medicine

## 2015-08-25 ENCOUNTER — Ambulatory Visit (INDEPENDENT_AMBULATORY_CARE_PROVIDER_SITE_OTHER): Payer: BC Managed Care – PPO | Admitting: Family Medicine

## 2015-08-25 VITALS — BP 136/90 | HR 73 | Temp 98.3°F | Ht 69.0 in | Wt 236.8 lb

## 2015-08-25 DIAGNOSIS — M2540 Effusion, unspecified joint: Secondary | ICD-10-CM

## 2015-08-25 DIAGNOSIS — M791 Myalgia, unspecified site: Secondary | ICD-10-CM

## 2015-08-25 DIAGNOSIS — Z0000 Encounter for general adult medical examination without abnormal findings: Secondary | ICD-10-CM | POA: Diagnosis not present

## 2015-08-25 DIAGNOSIS — G8929 Other chronic pain: Secondary | ICD-10-CM | POA: Diagnosis not present

## 2015-08-25 DIAGNOSIS — I10 Essential (primary) hypertension: Secondary | ICD-10-CM

## 2015-08-25 DIAGNOSIS — M25562 Pain in left knee: Secondary | ICD-10-CM | POA: Insufficient documentation

## 2015-08-25 DIAGNOSIS — M179 Osteoarthritis of knee, unspecified: Secondary | ICD-10-CM | POA: Diagnosis not present

## 2015-08-25 HISTORY — DX: Effusion, unspecified joint: M25.40

## 2015-08-25 LAB — CBC WITH DIFFERENTIAL/PLATELET
BASOS PCT: 0.6 % (ref 0.0–3.0)
Basophils Absolute: 0 10*3/uL (ref 0.0–0.1)
EOS ABS: 0.1 10*3/uL (ref 0.0–0.7)
EOS PCT: 1.9 % (ref 0.0–5.0)
HCT: 44.7 % (ref 39.0–52.0)
Hemoglobin: 14.6 g/dL (ref 13.0–17.0)
LYMPHS ABS: 2.7 10*3/uL (ref 0.7–4.0)
Lymphocytes Relative: 51.9 % — ABNORMAL HIGH (ref 12.0–46.0)
MCHC: 32.6 g/dL (ref 30.0–36.0)
MCV: 81.9 fl (ref 78.0–100.0)
MONO ABS: 0.6 10*3/uL (ref 0.1–1.0)
Monocytes Relative: 11.4 % (ref 3.0–12.0)
NEUTROS ABS: 1.8 10*3/uL (ref 1.4–7.7)
NEUTROS PCT: 34.2 % — AB (ref 43.0–77.0)
PLATELETS: 169 10*3/uL (ref 150.0–400.0)
RBC: 5.45 Mil/uL (ref 4.22–5.81)
RDW: 14.4 % (ref 11.5–15.5)
WBC: 5.2 10*3/uL (ref 4.0–10.5)

## 2015-08-25 LAB — COMPREHENSIVE METABOLIC PANEL
ALBUMIN: 4.4 g/dL (ref 3.5–5.2)
ALT: 69 U/L — ABNORMAL HIGH (ref 0–53)
AST: 49 U/L — AB (ref 0–37)
Alkaline Phosphatase: 35 U/L — ABNORMAL LOW (ref 39–117)
BUN: 16 mg/dL (ref 6–23)
CHLORIDE: 101 meq/L (ref 96–112)
CO2: 31 mEq/L (ref 19–32)
CREATININE: 1.3 mg/dL (ref 0.40–1.50)
Calcium: 9.6 mg/dL (ref 8.4–10.5)
GFR: 74.97 mL/min (ref >60.00)
GLUCOSE: 93 mg/dL (ref 70–99)
POTASSIUM: 3.9 meq/L (ref 3.5–5.1)
SODIUM: 138 meq/L (ref 135–145)
Total Bilirubin: 0.5 mg/dL (ref 0.2–1.2)
Total Protein: 7.7 g/dL (ref 6.0–8.3)

## 2015-08-25 LAB — THYROID PANEL WITH TSH
Free Thyroxine Index: 2.1 (ref 1.4–3.8)
T3 Uptake: 28 % (ref 22–35)
T4, Total: 7.5 ug/dL (ref 4.5–12.0)
TSH: 0.95 u[IU]/mL (ref 0.350–4.500)

## 2015-08-25 LAB — RHEUMATOID FACTOR: Rheumatoid fact SerPl-aCnc: <10 [IU]/mL (ref <=14)

## 2015-08-25 LAB — URIC ACID: Uric Acid, Serum: 8.3 mg/dL — ABNORMAL HIGH (ref 4.0–7.8)

## 2015-08-25 LAB — SEDIMENTATION RATE: SED RATE: 2 mm/h (ref 0–22)

## 2015-08-25 MED ORDER — AMLODIPINE BESYLATE 5 MG PO TABS: 5.0000 mg | ORAL_TABLET | Freq: Every day | ORAL | Status: DC

## 2015-08-25 NOTE — Progress Notes (Signed)
Patient ID: Alfred Martin, male    DOB: Dec 15, 1965  Age: 49 y.o. MRN: [id number]    Subjective:  Subjective HPI Alfred Martin presents for L knee, r ankle and r elbow pain and swelling and burning.  No injury.  Pt has stopped weight lifting and is walking about 5 miles a day.   There is a hx of RA in the family.   Pt states his ankle got so bad he was on crutches.   He is also here to f/u bp.    Review of Systems  Constitutional: Negative for diaphoresis, appetite change, fatigue and unexpected weight change.  Eyes: Negative for pain, redness and visual disturbance.  Respiratory: Negative for cough, chest tightness, shortness of breath and wheezing.   Cardiovascular: Negative for chest pain, palpitations and leg swelling.  Endocrine: Negative for cold intolerance, heat intolerance, polydipsia, polyphagia and polyuria.  Genitourinary: Negative for dysuria, frequency and difficulty urinating.  Musculoskeletal: Positive for joint swelling, arthralgias and gait problem. Negative for back pain, neck pain and neck stiffness.  Neurological: Negative for dizziness, light-headedness, numbness and headaches.    History Past Medical History  Diagnosis Date  . HTN (hypertension)     He has past surgical history that includes Tonsillectomy.   His family history includes Arthritis in his maternal aunt, maternal aunt, maternal aunt, and mother; Breast cancer in his maternal aunt and paternal grandmother; Colon cancer in his paternal uncle; Congestive Heart Failure in his brother and paternal uncle; Diabetes in his brother, father, and mother; Drug abuse in his brother; Heart disease in his maternal grandmother; High blood pressure in his father and mother; Hyperlipidemia in his father and mother; Hypertension in his brother, brother, father, and mother; Stroke in his paternal uncle.He reports that he has never smoked. He has never used smokeless tobacco. He reports that he does not drink alcohol or use illicit  drugs.  No current outpatient prescriptions on file prior to visit.   No current facility-administered medications on file prior to visit.     Objective:  Objective Physical Exam  Constitutional: He is oriented to person, place, and time. Vital signs are normal. He appears well-developed and well-nourished. He is sleeping.  HENT:  Head: Normocephalic and atraumatic.  Mouth/Throat: Oropharynx is clear and moist.  Eyes: EOM are normal. Pupils are equal, round, and reactive to light.  Neck: Normal range of motion. Neck supple. No thyromegaly present.  Cardiovascular: Normal rate and regular rhythm.   No murmur heard. Pulmonary/Chest: Effort normal and breath sounds normal. No respiratory distress. He has no wheezes. He has no rales. He exhibits no tenderness.  Musculoskeletal: He exhibits edema and tenderness.       Right elbow: He exhibits swelling. Tenderness found.       Left wrist: He exhibits tenderness and swelling.       Left knee: He exhibits swelling and effusion. Tenderness found. Medial joint line tenderness noted.       Right ankle: He exhibits swelling. Tenderness.  Neurological: He is alert and oriented to person, place, and time.  Skin: Skin is warm and dry.  Psychiatric: He has a normal mood and affect. His behavior is normal. Judgment and thought content normal.  Nursing note and vitals reviewed.  BP 136/90 mmHg  Pulse 73  Temp(Src) 98.3 F (36.8 C) (Oral)  Ht 5' 9"  (2.637 m)  Wt 236 lb 12.8 oz (107.412 kg)  BMI 34.95 kg/m2  SpO2 98% Wt Readings from Last 3 Encounters:  08/25/15  236 lb 12.8 oz (107.412 kg)  06/15/15 235 lb (106.595 kg)  06/01/15 235 lb 9.6 oz (106.867 kg)     Lab Results  Component Value Date   WBC 6.3 06/01/2015   HGB 14.4 06/01/2015   HCT 44.4 06/01/2015   PLT 143.0* 06/01/2015   GLUCOSE 86 06/01/2015   CHOL 197 06/01/2015   TRIG 119.0 06/01/2015   HDL 47.70 06/01/2015   LDLCALC 126* 06/01/2015   ALT 35 06/01/2015   AST 29  06/01/2015   NA 141 06/01/2015   K 4.1 06/01/2015   CL 105 06/01/2015   CREATININE 1.36 06/01/2015   BUN 16 06/01/2015   CO2 26 06/01/2015   TSH 1.80 06/01/2015   PSA 1.06 06/01/2015    Patient was never admitted.   Assessment & Plan:  Plan I have discontinued Mr. Dietze sulfamethoxazole-trimethoprim and lisinopril-hydrochlorothiazide. I am also having him start on amLODipine.  Meds ordered this encounter  Medications  . amLODipine (NORVASC) 5 MG tablet    Sig: Take 1 tablet (5 mg total) by mouth daily.    Dispense:  30 tablet    Refill:  3    Problem List Items Addressed This Visit    Preventative health care   Relevant Orders   Ambulatory referral to Gastroenterology   Joint swelling    fam hx RA Check labs       HTN (hypertension)    Pt would like to change bp med due to possible side effect of angioedema and ED norvasc 5 mg 1 po qd  rto 2-3 weeks      Relevant Medications   amLODipine (NORVASC) 5 MG tablet    Other Visit Diagnoses    Swelling of joint of multiple sites    -  Primary    Relevant Orders    Comp Met (CMET)    CBC with Differential/Platelet    Vitamin D 1,25 dihydroxy    ANA    Sed Rate (ESR)    Uric acid    Rheumatoid factor    Thyroid Panel With TSH    Myalgia        Relevant Orders    Comp Met (CMET)    CBC with Differential/Platelet    Vitamin D 1,25 dihydroxy    ANA    Sed Rate (ESR)    Uric acid    Rheumatoid factor    Thyroid Panel With TSH       Follow-up: Return in about 2 weeks (around 09/08/2015), or if symptoms worsen or fail to improve, for bp.  [name], DO

## 2015-08-25 NOTE — Assessment & Plan Note (Signed)
Pt would like to change bp med due to possible side effect of angioedema and ED norvasc 5 mg 1 po qd  rto 2-3 weeks

## 2015-08-25 NOTE — Patient Instructions (Signed)
Arthritis, Nonspecific °Arthritis is inflammation of a joint. This usually means pain, redness, warmth or swelling are present. One or more joints may be involved. There are a number of types of arthritis. Your caregiver may not be able to tell what type of arthritis you have right away. °CAUSES  °The most common cause of arthritis is the wear and tear on the joint (osteoarthritis). This causes damage to the cartilage, which can break down over time. The knees, hips, back and neck are most often affected by this type of arthritis. °Other types of arthritis and common causes of joint pain include: °· Sprains and other injuries near the joint. Sometimes minor sprains and injuries cause pain and swelling that develop hours later. °· Rheumatoid arthritis. This affects hands, feet and knees. It usually affects both sides of your body at the same time. It is often associated with chronic ailments, fever, weight loss and general weakness. °· Crystal arthritis. Gout and pseudo gout can cause occasional acute severe pain, redness and swelling in the foot, ankle, or knee. °· Infectious arthritis. Bacteria can get into a joint through a break in overlying skin. This can cause infection of the joint. Bacteria and viruses can also spread through the blood and affect your joints. °· Drug, infectious and allergy reactions. Sometimes joints can become mildly painful and slightly swollen with these types of illnesses. °SYMPTOMS  °· Pain is the main symptom. °· Your joint or joints can also be red, swollen and warm or hot to the touch. °· You may have a fever with certain types of arthritis, or even feel overall ill. °· The joint with arthritis will hurt with movement. Stiffness is present with some types of arthritis. °DIAGNOSIS  °Your caregiver will suspect arthritis based on your description of your symptoms and on your exam. Testing may be needed to find the type of arthritis: °· Blood and sometimes urine tests. °· X-ray tests  and sometimes CT or MRI scans. °· Removal of fluid from the joint (arthrocentesis) is done to check for bacteria, crystals or other causes. Your caregiver (or a specialist) will numb the area over the joint with a local anesthetic, and use a needle to remove joint fluid for examination. This procedure is only minimally uncomfortable. °· Even with these tests, your caregiver may not be able to tell what kind of arthritis you have. Consultation with a specialist (rheumatologist) may be helpful. °TREATMENT  °Your caregiver will discuss with you treatment specific to your type of arthritis. If the specific type cannot be determined, then the following general recommendations may apply. °Treatment of severe joint pain includes: °· Rest. °· Elevation. °· Anti-inflammatory medication (for example, ibuprofen) may be prescribed. Avoiding activities that cause increased pain. °· Only take over-the-counter or prescription medicines for pain and discomfort as recommended by your caregiver. °· Cold packs over an inflamed joint may be used for 10 to 15 minutes every hour. Hot packs sometimes feel better, but do not use overnight. Do not use hot packs if you are diabetic without your caregiver's permission. °· A cortisone shot into arthritic joints may help reduce pain and swelling. °· Any acute arthritis that gets worse over the next 1 to 2 days needs to be looked at to be sure there is no joint infection. °Long-term arthritis treatment involves modifying activities and lifestyle to reduce joint stress jarring. This can include weight loss. Also, exercise is needed to nourish the joint cartilage and remove waste. This helps keep the muscles   around the joint strong. °HOME CARE INSTRUCTIONS  °· Do not take aspirin to relieve pain if gout is suspected. This elevates uric acid levels. °· Only take over-the-counter or prescription medicines for pain, discomfort or fever as directed by your caregiver. °· Rest the joint as much as  possible. °· If your joint is swollen, keep it elevated. °· Use crutches if the painful joint is in your leg. °· Drinking plenty of fluids may help for certain types of arthritis. °· Follow your caregiver's dietary instructions. °· Try low-impact exercise such as: °¨ Swimming. °¨ Water aerobics. °¨ Biking. °¨ Walking. °· Morning stiffness is often relieved by a warm shower. °· Put your joints through regular range-of-motion. °SEEK MEDICAL CARE IF:  °· You do not feel better in 24 hours or are getting worse. °· You have side effects to medications, or are not getting better with treatment. °SEEK IMMEDIATE MEDICAL CARE IF:  °· You have a fever. °· You develop severe joint pain, swelling or redness. °· Many joints are involved and become painful and swollen. °· There is severe back pain and/or leg weakness. °· You have loss of bowel or bladder control. °Document Released: 12/26/2004 Document Revised: 02/10/2012 Document Reviewed: 01/11/2009 °ExitCare® Patient Information ©2015 [employer]. This information is not intended to replace advice given to you by your health care Alfred Martin. Make sure you discuss any questions you have with your health care Alfred Martin. ° °

## 2015-08-25 NOTE — Assessment & Plan Note (Signed)
fam hx RA Check labs

## 2015-08-25 NOTE — Progress Notes (Signed)
Pre visit review using our clinic review tool, if applicable. No additional management support is needed unless otherwise documented below in the visit note. 

## 2015-08-26 LAB — ANA: ANA: NEGATIVE

## 2015-08-30 LAB — VITAMIN D 1,25 DIHYDROXY
VITAMIN D 1, 25 (OH) TOTAL: 31 pg/mL (ref 18–72)
Vitamin D2 1, 25 (OH)2: <8 pg/mL
Vitamin D3 1, 25 (OH)2: 31 pg/mL

## 2015-08-31 ENCOUNTER — Other Ambulatory Visit: Payer: Self-pay | Admitting: Family Medicine

## 2015-08-31 MED ORDER — ALLOPURINOL 100 MG PO TABS: 100.0000 mg | ORAL_TABLET | Freq: Every day | ORAL | Status: DC

## 2015-09-01 ENCOUNTER — Other Ambulatory Visit: Payer: Self-pay

## 2015-09-01 MED ORDER — VITAMIN D (ERGOCALCIFEROL) 1.25 MG (50000 UNIT) PO CAPS: 50000.0000 [IU] | ORAL_CAPSULE | ORAL | Status: DC

## 2015-10-20 ENCOUNTER — Encounter: Payer: Self-pay | Admitting: Family Medicine

## 2015-10-20 ENCOUNTER — Ambulatory Visit (INDEPENDENT_AMBULATORY_CARE_PROVIDER_SITE_OTHER): Payer: BC Managed Care – PPO | Admitting: Family Medicine

## 2015-10-20 VITALS — BP 154/98 | HR 83 | Temp 98.2°F | Wt 238.0 lb

## 2015-10-20 DIAGNOSIS — M7021 Olecranon bursitis, right elbow: Secondary | ICD-10-CM | POA: Diagnosis not present

## 2015-10-20 DIAGNOSIS — M1000 Idiopathic gout, unspecified site: Secondary | ICD-10-CM

## 2015-10-20 DIAGNOSIS — I10 Essential (primary) hypertension: Secondary | ICD-10-CM | POA: Diagnosis not present

## 2015-10-20 MED ORDER — AMLODIPINE BESYLATE 10 MG PO TABS: 10.0000 mg | ORAL_TABLET | Freq: Every day | ORAL | Status: DC

## 2015-10-20 NOTE — Patient Instructions (Signed)
Hypertension Hypertension, commonly called high blood pressure, is when the force of blood pumping through your arteries is too strong. Your arteries are the blood vessels that carry blood from your heart throughout your body. A blood pressure reading consists of a higher number over a lower number, such as 110/72. The higher number (systolic) is the pressure inside your arteries when your heart pumps. The lower number (diastolic) is the pressure inside your arteries when your heart relaxes. Ideally you want your blood pressure below 120/80. Hypertension forces your heart to work harder to pump blood. Your arteries may become narrow or stiff. Having untreated or uncontrolled hypertension can cause heart attack, stroke, kidney disease, and other problems. RISK FACTORS Some risk factors for high blood pressure are controllable. Others are not.  Risk factors you cannot control include:   Race. You may be at higher risk if you are African American.  Age. Risk increases with age.  Gender. Men are at higher risk than women before age 45 years. After age 65, women are at higher risk than men. Risk factors you can control include:  Not getting enough exercise or physical activity.  Being overweight.  Getting too much fat, sugar, calories, or salt in your diet.  Drinking too much alcohol. SIGNS AND SYMPTOMS Hypertension does not usually cause signs or symptoms. Extremely high blood pressure (hypertensive crisis) may cause headache, anxiety, shortness of breath, and nosebleed. DIAGNOSIS To check if you have hypertension, your health care provider will measure your blood pressure while you are seated, with your arm held at the level of your heart. It should be measured at least twice using the same arm. Certain conditions can cause a difference in blood pressure between your right and left arms. A blood pressure reading that is higher than normal on one occasion does not mean that you need treatment. If  it is not clear whether you have high blood pressure, you may be asked to return on a different day to have your blood pressure checked again. Or, you may be asked to monitor your blood pressure at home for 1 or more weeks. TREATMENT Treating high blood pressure includes making lifestyle changes and possibly taking medicine. Living a healthy lifestyle can help lower high blood pressure. You may need to change some of your habits. Lifestyle changes may include:  Following the DASH diet. This diet is high in fruits, vegetables, and whole grains. It is low in salt, red meat, and added sugars.  Keep your sodium intake below 2,300 mg per day.  Getting at least 30-45 minutes of aerobic exercise at least 4 times per week.  Losing weight if necessary.  Not smoking.  Limiting alcoholic beverages.  Learning ways to reduce stress. Your health care provider may prescribe medicine if lifestyle changes are not enough to get your blood pressure under control, and if one of the following is true:  You are 18-59 years of age and your systolic blood pressure is above 140.  You are 60 years of age or older, and your systolic blood pressure is above 150.  Your diastolic blood pressure is above 90.  You have diabetes, and your systolic blood pressure is over 140 or your diastolic blood pressure is over 90.  You have kidney disease and your blood pressure is above 140/90.  You have heart disease and your blood pressure is above 140/90. Your personal target blood pressure may vary depending on your medical conditions, your age, and other factors. HOME CARE INSTRUCTIONS    Have your blood pressure rechecked as directed by your health care provider.   Take medicines only as directed by your health care provider. Follow the directions carefully. Blood pressure medicines must be taken as prescribed. The medicine does not work as well when you skip doses. Skipping doses also puts you at risk for  problems.  Do not smoke.   Monitor your blood pressure at home as directed by your health care provider. SEEK MEDICAL CARE IF:   You think you are having a reaction to medicines taken.  You have recurrent headaches or feel dizzy.  You have swelling in your ankles.  You have trouble with your vision. SEEK IMMEDIATE MEDICAL CARE IF:  You develop a severe headache or confusion.  You have unusual weakness, numbness, or feel faint.  You have severe chest or abdominal pain.  You vomit repeatedly.  You have trouble breathing. MAKE SURE YOU:   Understand these instructions.  Will watch your condition.  Will get help right away if you are not doing well or get worse.   This information is not intended to replace advice given to you by your health care provider. Make sure you discuss any questions you have with your health care provider.   Document Released: 11/18/2005 Document Revised: 04/04/2015 Document Reviewed: 09/10/2013 Elsevier Interactive Patient Education 2016 [employer]  

## 2015-10-20 NOTE — Progress Notes (Signed)
Patient ID: Alfred Martin, male    DOB: 24-Apr-1966  Age: 49 y.o. MRN: [id number]    Subjective:  Subjective HPI Alfred Martin presents for c/o R elbow-- in known injury.  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    Review of Systems  Constitutional: Negative for diaphoresis, appetite change, fatigue and unexpected weight change.  Eyes: Negative for pain, redness and visual disturbance.  Respiratory: Negative for cough, chest tightness, shortness of breath and wheezing.   Cardiovascular: Negative for chest pain, palpitations and leg swelling.  Endocrine: Negative for cold intolerance, heat intolerance, polydipsia, polyphagia and polyuria.  Genitourinary: Negative for dysuria, frequency and difficulty urinating.  Musculoskeletal: Positive for joint swelling.  Neurological: Negative for dizziness, light-headedness, numbness and headaches.  All other systems reviewed and are negative.   History Past Medical History  Diagnosis Date  . HTN (hypertension)     He has past surgical history that includes Tonsillectomy.   His family history includes Arthritis in his maternal aunt, maternal aunt, maternal aunt, and mother; Breast cancer in his maternal aunt and paternal grandmother; Colon cancer in his paternal uncle; Congestive Heart Failure in his brother and paternal uncle; Diabetes in his brother, father, and mother; Drug abuse in his brother; HIV/AIDS in his brother; Heart disease in his maternal grandmother; High blood pressure in his father and mother; Hyperlipidemia in his father and mother; Hypertension in his brother, brother, father, and mother; Stroke in his paternal uncle.He reports that he has never smoked. He has never used smokeless tobacco. He reports that he does not drink alcohol or use illicit drugs.  Current Outpatient Prescriptions on File Prior to Visit  Medication Sig Dispense Refill  . allopurinol (ZYLOPRIM) 100 MG tablet Take 1 tablet (100 mg total) by mouth  daily. 30 tablet 2   No current facility-administered medications on file prior to visit.     Objective:  Objective Physical Exam  Constitutional: He is oriented to person, place, and time. Vital signs are normal. He appears well-developed and well-nourished. He is sleeping.  HENT:  Head: Normocephalic and atraumatic.  Mouth/Throat: Oropharynx is clear and moist.  Eyes: EOM are normal. Pupils are equal, round, and reactive to light.  Neck: Normal range of motion. Neck supple. No thyromegaly present.  Cardiovascular: Normal rate and regular rhythm.   No murmur heard. Pulmonary/Chest: Effort normal and breath sounds normal. No respiratory distress. He has no wheezes. He has no rales. He exhibits no tenderness.  Musculoskeletal: He exhibits edema. He exhibits no tenderness.       Right elbow: He exhibits swelling and effusion. No tenderness found.       Arms: Neurological: He is alert and oriented to person, place, and time.  Skin: Skin is warm and dry.  Psychiatric: He has a normal mood and affect. His behavior is normal. Judgment and thought content normal.   BP 154/98 mmHg  Pulse 83  Temp(Src) 98.2 F (36.8 C) (Oral)  Wt 238 lb (107.956 kg)  SpO2 98% Wt Readings from Last 3 Encounters:  10/20/15 238 lb (107.956 kg)  08/25/15 236 lb 12.8 oz (107.412 kg)  06/15/15 235 lb (106.595 kg)     Lab Results  Component Value Date   WBC 5.2 08/25/2015   HGB 14.6 08/25/2015   HCT 44.7 08/25/2015   PLT 169.0 08/25/2015   GLUCOSE 93 08/25/2015   CHOL 197 06/01/2015   TRIG 119.0 06/01/2015   HDL 47.70 06/01/2015   LDLCALC 126* 06/01/2015  ALT 69* 08/25/2015   AST 49* 08/25/2015   NA 138 08/25/2015   K 3.9 08/25/2015   CL 101 08/25/2015   CREATININE 1.30 08/25/2015   BUN 16 08/25/2015   CO2 31 08/25/2015   TSH 0.950 08/25/2015   PSA 1.06 06/01/2015    Dg Knee Complete 4 Views Left  08/25/2015  CLINICAL DATA:  Chronic left knee pain without known injury. EXAM: LEFT KNEE -  COMPLETE 4+ VIEW COMPARISON:  None. FINDINGS: There is no evidence of fracture, dislocation, or joint effusion. Mild narrowing of medial joint space is noted. Soft tissues are unremarkable. 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 No acute abnormality seen in the left knee. Electronically Signed   By: Marijo Conception, M.D.   On: 08/25/2015 14:38     Assessment & Plan:  Plan I have discontinued Mr. Godette amLODipine and Vitamin D (Ergocalciferol). I am also having him start on amLODipine. Additionally, I am having him maintain his allopurinol.  Meds ordered this encounter  Medications  . amLODipine (NORVASC) 10 MG tablet    Sig: Take 1 tablet (10 mg total) by mouth daily.    Dispense:  90 tablet    Refill:  3    Problem List Items Addressed This Visit    HTN (hypertension) - Primary   Relevant Medications   amLODipine (NORVASC) 10 MG tablet    Other Visit Diagnoses    Idiopathic gout, unspecified chronicity, unspecified site        Relevant Orders    Basic metabolic panel    Uric acid    Olecranon bursitis of right elbow        Relevant Orders    DG Elbow Complete Right       Follow-up: Return in about 4 weeks (around 11/17/2015), or if symptoms worsen or fail to improve, for hypertension.  [name], DO

## 2015-10-20 NOTE — Progress Notes (Signed)
Pre visit review using our clinic review tool, if applicable. No additional management support is needed unless otherwise documented below in the visit note. 

## 2015-10-24 ENCOUNTER — Other Ambulatory Visit: Payer: Self-pay

## 2015-10-24 ENCOUNTER — Ambulatory Visit: Payer: BC Managed Care – PPO | Admitting: Family Medicine

## 2015-10-24 DIAGNOSIS — I10 Essential (primary) hypertension: Secondary | ICD-10-CM

## 2015-10-24 MED ORDER — AMLODIPINE BESYLATE 10 MG PO TABS
10.0000 mg | ORAL_TABLET | Freq: Every day | ORAL | Status: DC
Start: 2015-10-24 — End: 2016-01-23

## 2015-10-24 MED ORDER — ALLOPURINOL 100 MG PO TABS: 100.0000 mg | ORAL_TABLET | Freq: Every day | ORAL | Status: DC

## 2015-10-30 ENCOUNTER — Ambulatory Visit: Payer: BC Managed Care – PPO | Admitting: Family Medicine

## 2015-11-21 ENCOUNTER — Ambulatory Visit: Payer: BC Managed Care – PPO | Admitting: Family Medicine

## 2015-11-29 ENCOUNTER — Other Ambulatory Visit: Payer: Self-pay | Admitting: Family Medicine

## 2015-12-29 ENCOUNTER — Ambulatory Visit: Payer: BC Managed Care – PPO | Admitting: Family Medicine

## 2015-12-29 ENCOUNTER — Telehealth: Payer: Self-pay | Admitting: Family Medicine

## 2015-12-29 NOTE — Telephone Encounter (Signed)
Pt lvm at 9:43 stating that something came up at work so he will not be able to make it to his appt.   Should pt be charged?

## 2015-12-29 NOTE — Telephone Encounter (Signed)
No charge. 

## 2016-01-08 ENCOUNTER — Encounter: Payer: Self-pay | Admitting: Family Medicine

## 2016-01-09 NOTE — Telephone Encounter (Signed)
Is he asking a question --- I can't tell

## 2016-01-09 NOTE — Telephone Encounter (Signed)
Called to follow up with patient.  Pt states that he does not like the way Amlodipine makes him feel.  States he can't really describe how it makes him feel, but he doesn't feel well when taking it.  He says he has been off of the medication for 4 days and he's finally starting to feel normal again.  Since he has stopped medication, his blood pressures have been running [id number] systolic and 0000000 diaystolic.  He says that he is decreasing his salt intake, eating more fruits and vegetables and drinking plenty of water until he can come in on 01/23/16 (which he had already scheduled prior to call) to be seen.  Pt was advised to monitor blood pressures, if they get too high, to call office for further instructions.  He stated understanding and agreed to comply.    Message routed to PCP for FYI.

## 2016-01-09 NOTE — Telephone Encounter (Signed)
Called to make patient aware.  Left a message for call back.   

## 2016-01-09 NOTE — Telephone Encounter (Signed)
That is fine but take hctz 25 mg 1 po qd #30  2 refills

## 2016-01-12 MED ORDER — HYDROCHLOROTHIAZIDE 25 MG PO TABS: 25.0000 mg | ORAL_TABLET | Freq: Every day | ORAL | Status: DC

## 2016-01-12 NOTE — Telephone Encounter (Signed)
Pt notified and made aware.  He asked that medication be sent to the pharmacy.  He said he would research medication and talk to Dr. Etter Sjogren about it at his appt on 01/23/16.  Rx sent to pharmacy.

## 2016-01-23 ENCOUNTER — Encounter: Payer: Self-pay | Admitting: Family Medicine

## 2016-01-23 ENCOUNTER — Ambulatory Visit (INDEPENDENT_AMBULATORY_CARE_PROVIDER_SITE_OTHER): Payer: BC Managed Care – PPO | Admitting: Family Medicine

## 2016-01-23 VITALS — BP 158/98 | HR 89 | Temp 97.8°F | Ht 69.0 in | Wt 240.0 lb

## 2016-01-23 DIAGNOSIS — I10 Essential (primary) hypertension: Secondary | ICD-10-CM

## 2016-01-23 DIAGNOSIS — N529 Male erectile dysfunction, unspecified: Secondary | ICD-10-CM

## 2016-01-23 LAB — COMPREHENSIVE METABOLIC PANEL
ALT: 54 U/L — ABNORMAL HIGH (ref 0–53)
AST: 45 U/L — ABNORMAL HIGH (ref 0–37)
Albumin: 4.3 g/dL (ref 3.5–5.2)
Alkaline Phosphatase: 36 U/L — ABNORMAL LOW (ref 39–117)
BUN: 20 mg/dL (ref 6–23)
CHLORIDE: 102 meq/L (ref 96–112)
CO2: 31 meq/L (ref 19–32)
Calcium: 9.7 mg/dL (ref 8.4–10.5)
Creatinine, Ser: 1.61 mg/dL — ABNORMAL HIGH (ref 0.40–1.50)
GFR: 58.48 mL/min — AB (ref >60.00)
GLUCOSE: 82 mg/dL (ref 70–99)
POTASSIUM: 3.9 meq/L (ref 3.5–5.1)
Sodium: 138 mEq/L (ref 135–145)
Total Bilirubin: 0.6 mg/dL (ref 0.2–1.2)
Total Protein: 7.4 g/dL (ref 6.0–8.3)

## 2016-01-23 LAB — LIPID PANEL
CHOL/HDL RATIO: 4
Cholesterol: 196 mg/dL (ref 0–200)
HDL: 48 mg/dL (ref >39.00)
LDL Cholesterol: 120 mg/dL — ABNORMAL HIGH (ref 0–99)
NONHDL: 148.04
Triglycerides: 138 mg/dL (ref 0.0–149.0)
VLDL: 27.6 mg/dL (ref 0.0–40.0)

## 2016-01-23 MED ORDER — AMLODIPINE BESY-BENAZEPRIL HCL 10-20 MG PO CAPS
1.0000 | ORAL_CAPSULE | Freq: Every day | ORAL | Status: DC
Start: 2016-01-23 — End: 2016-05-10

## 2016-01-23 MED ORDER — SILDENAFIL CITRATE 100 MG PO TABS: 50.0000 mg | ORAL_TABLET | Freq: Every day | ORAL | Status: DC | PRN

## 2016-01-23 MED ORDER — ASPIRIN EC 81 MG PO TBEC
81.0000 mg | DELAYED_RELEASE_TABLET | Freq: Every day | ORAL | Status: DC
Start: 2016-01-23 — End: 2016-05-16

## 2016-01-23 NOTE — Progress Notes (Signed)
Pre visit review using our clinic review tool, if applicable. No additional management support is needed unless otherwise documented below in the visit note. 

## 2016-01-23 NOTE — Patient Instructions (Signed)
Hypertension Hypertension, commonly called high blood pressure, is when the force of blood pumping through your arteries is too strong. Your arteries are the blood vessels that carry blood from your heart throughout your body. A blood pressure reading consists of a higher number over a lower number, such as 110/72. The higher number (systolic) is the pressure inside your arteries when your heart pumps. The lower number (diastolic) is the pressure inside your arteries when your heart relaxes. Ideally you want your blood pressure below 120/80. Hypertension forces your heart to work harder to pump blood. Your arteries may become narrow or stiff. Having untreated or uncontrolled hypertension can cause heart attack, stroke, kidney disease, and other problems. RISK FACTORS Some risk factors for high blood pressure are controllable. Others are not.  Risk factors you cannot control include:   Race. You may be at higher risk if you are African American.  Age. Risk increases with age.  Gender. Men are at higher risk than women before age 45 years. After age 65, women are at higher risk than men. Risk factors you can control include:  Not getting enough exercise or physical activity.  Being overweight.  Getting too much fat, sugar, calories, or salt in your diet.  Drinking too much alcohol. SIGNS AND SYMPTOMS Hypertension does not usually cause signs or symptoms. Extremely high blood pressure (hypertensive crisis) may cause headache, anxiety, shortness of breath, and nosebleed. DIAGNOSIS To check if you have hypertension, your health care provider will measure your blood pressure while you are seated, with your arm held at the level of your heart. It should be measured at least twice using the same arm. Certain conditions can cause a difference in blood pressure between your right and left arms. A blood pressure reading that is higher than normal on one occasion does not mean that you need treatment. If  it is not clear whether you have high blood pressure, you may be asked to return on a different day to have your blood pressure checked again. Or, you may be asked to monitor your blood pressure at home for 1 or more weeks. TREATMENT Treating high blood pressure includes making lifestyle changes and possibly taking medicine. Living a healthy lifestyle can help lower high blood pressure. You may need to change some of your habits. Lifestyle changes may include:  Following the DASH diet. This diet is high in fruits, vegetables, and whole grains. It is low in salt, red meat, and added sugars.  Keep your sodium intake below 2,300 mg per day.  Getting at least 30-45 minutes of aerobic exercise at least 4 times per week.  Losing weight if necessary.  Not smoking.  Limiting alcoholic beverages.  Learning ways to reduce stress. Your health care provider may prescribe medicine if lifestyle changes are not enough to get your blood pressure under control, and if one of the following is true:  You are 18-59 years of age and your systolic blood pressure is above 140.  You are 60 years of age or older, and your systolic blood pressure is above 150.  Your diastolic blood pressure is above 90.  You have diabetes, and your systolic blood pressure is over 140 or your diastolic blood pressure is over 90.  You have kidney disease and your blood pressure is above 140/90.  You have heart disease and your blood pressure is above 140/90. Your personal target blood pressure may vary depending on your medical conditions, your age, and other factors. HOME CARE INSTRUCTIONS    Have your blood pressure rechecked as directed by your health care provider.   Take medicines only as directed by your health care provider. Follow the directions carefully. Blood pressure medicines must be taken as prescribed. The medicine does not work as well when you skip doses. Skipping doses also puts you at risk for  problems.  Do not smoke.   Monitor your blood pressure at home as directed by your health care provider. SEEK MEDICAL CARE IF:   You think you are having a reaction to medicines taken.  You have recurrent headaches or feel dizzy.  You have swelling in your ankles.  You have trouble with your vision. SEEK IMMEDIATE MEDICAL CARE IF:  You develop a severe headache or confusion.  You have unusual weakness, numbness, or feel faint.  You have severe chest or abdominal pain.  You vomit repeatedly.  You have trouble breathing. MAKE SURE YOU:   Understand these instructions.  Will watch your condition.  Will get help right away if you are not doing well or get worse.   This information is not intended to replace advice given to you by your health care provider. Make sure you discuss any questions you have with your health care provider.   Document Released: 11/18/2005 Document Revised: 04/04/2015 Document Reviewed: 09/10/2013 Elsevier Interactive Patient Education 2016 [employer]  

## 2016-01-23 NOTE — Assessment & Plan Note (Signed)
lotrel--- pt was on this previously rto 2-3 weeks

## 2016-01-23 NOTE — Progress Notes (Signed)
Patient ID: Alfred Martin, male    DOB: Oct 26, 1966  Age: 50 y.o. MRN: [id number]    Subjective:  Subjective HPI Alfred Martin presents for f/u bp.  Pt is not taking his bp meds because he did not like the way it made him feel.    Review of Systems  Constitutional: Negative for diaphoresis, appetite change, fatigue and unexpected weight change.  Eyes: Negative for pain, redness and visual disturbance.  Respiratory: Negative for cough, chest tightness, shortness of breath and wheezing.   Cardiovascular: Negative for chest pain, palpitations and leg swelling.  Endocrine: Negative for cold intolerance, heat intolerance, polydipsia, polyphagia and polyuria.  Genitourinary: Negative for dysuria, frequency and difficulty urinating.  Neurological: Negative for dizziness, light-headedness, numbness and headaches.    History Past Medical History  Diagnosis Date  . HTN (hypertension)     He has past surgical history that includes Tonsillectomy.   His family history includes Arthritis in his maternal aunt, maternal aunt, maternal aunt, and mother; Breast cancer in his maternal aunt and paternal grandmother; Colon cancer in his paternal uncle; Congestive Heart Failure in his brother and paternal uncle; Diabetes in his brother, father, and mother; Drug abuse in his brother; HIV/AIDS in his brother; Heart disease in his maternal grandmother; High blood pressure in his father and mother; Hyperlipidemia in his father and mother; Hypertension in his brother, brother, father, and mother; Stroke in his paternal uncle.He reports that he has never smoked. He has never used smokeless tobacco. He reports that he does not drink alcohol or use illicit drugs.  Current Outpatient Prescriptions on File Prior to Visit  Medication Sig Dispense Refill  . allopurinol (ZYLOPRIM) 100 MG tablet Take 1 tablet (100 mg total) by mouth daily. 90 tablet 1  . hydrochlorothiazide (HYDRODIURIL) 25 MG tablet Take 1 tablet (25 mg total) by  mouth daily. (Patient not taking: Reported on 01/23/2016) 30 tablet 2   No current facility-administered medications on file prior to visit.     Objective:  Objective Physical Exam  Constitutional: He is oriented to person, place, and time. Vital signs are normal. He appears well-developed and well-nourished. He is sleeping.  HENT:  Head: Normocephalic and atraumatic.  Mouth/Throat: Oropharynx is clear and moist.  Eyes: EOM are normal. Pupils are equal, round, and reactive to light.  Neck: Normal range of motion. Neck supple. No thyromegaly present.  Cardiovascular: Normal rate and regular rhythm.   No murmur heard. Pulmonary/Chest: Effort normal and breath sounds normal. No respiratory distress. He has no wheezes. He has no rales. He exhibits no tenderness.  Musculoskeletal: He exhibits no edema or tenderness.  Neurological: He is alert and oriented to person, place, and time.  Skin: Skin is warm and dry.  Psychiatric: He has a normal mood and affect. His behavior is normal. Judgment and thought content normal.  Nursing note and vitals reviewed.  BP 158/98 mmHg  Pulse 89  Temp(Src) 97.8 F (36.6 C) (Oral)  Ht _0  (1.753 m)  Wt 240 lb (108.863 kg)  BMI 35.43 kg/m2  SpO2 98% Wt Readings from Last 3 Encounters:  01/23/16 240 lb (108.863 kg)  10/20/15 238 lb (107.956 kg)  08/25/15 236 lb 12.8 oz (107.412 kg)     Lab Results  Component Value Date   WBC 5.2 08/25/2015   HGB 14.6 08/25/2015   HCT 44.7 08/25/2015   PLT 169.0 08/25/2015   GLUCOSE 82 01/23/2016   CHOL 196 01/23/2016   TRIG 138.0 01/23/2016   HDL 48.00  01/23/2016   LDLCALC 120* 01/23/2016   ALT 54* 01/23/2016   AST 45* 01/23/2016   NA 138 01/23/2016   K 3.9 01/23/2016   CL 102 01/23/2016   CREATININE 1.61* 01/23/2016   BUN 20 01/23/2016   CO2 31 01/23/2016   TSH 0.950 08/25/2015   PSA 1.06 06/01/2015    Dg Knee Complete 4 Views Left  08/25/2015  CLINICAL DATA:  Chronic left knee pain without known  injury. EXAM: LEFT KNEE - COMPLETE 4+ VIEW COMPARISON:  None. FINDINGS: There is no evidence of fracture, dislocation, or joint effusion. Mild narrowing of medial joint space is noted. Soft tissues are unremarkable. 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 No acute abnormality seen in the left knee. Electronically Signed   By: Marijo Conception, M.D.   On: 08/25/2015 14:38     Assessment & Plan:  Plan I have discontinued Mr. Fillingim amLODipine. I am also having him start on amLODipine-benazepril, aspirin EC, and sildenafil. Additionally, I am having him maintain his allopurinol and hydrochlorothiazide.  Meds ordered this encounter  Medications  . amLODipine-benazepril (LOTREL) 10-20 MG capsule    Sig: Take 1 capsule by mouth daily.    Dispense:  30 capsule    Refill:  2  . aspirin EC 81 MG tablet    Sig: Take 1 tablet (81 mg total) by mouth daily.    Dispense:  30 tablet  . sildenafil (VIAGRA) 100 MG tablet    Sig: Take 0.5-1 tablets (50-100 mg total) by mouth daily as needed for erectile dysfunction.    Dispense:  10 tablet    Refill:  11    Problem List Items Addressed This Visit    None    Visit Diagnoses    Essential hypertension    -  Primary    Relevant Medications    amLODipine-benazepril (LOTREL) 10-20 MG capsule    aspirin EC 81 MG tablet    sildenafil (VIAGRA) 100 MG tablet    Other Relevant Orders    Testosterone Total,Free,Bio, Males    Lipid panel (Completed)    Comp Met (CMET) (Completed)    Erectile dysfunction, unspecified erectile dysfunction type        Relevant Medications    sildenafil (VIAGRA) 100 MG tablet       Follow-up: Return if symptoms worsen or fail to improve.  Alfred Koyanagi, DO

## 2016-01-24 LAB — TESTOSTERONE TOTAL,FREE,BIO, MALES
ALBUMIN: 4.1 g/dL (ref 3.6–5.1)
SEX HORMONE BINDING: 53 nmol/L — AB (ref 10–50)
TESTOSTERONE: 331 ng/dL (ref 250–827)
Testosterone, Bioavailable: 54.1 ng/dL — ABNORMAL LOW (ref 130.5–681.7)
Testosterone, Free: 28.7 pg/mL — ABNORMAL LOW (ref 47.0–244.0)

## 2016-01-26 ENCOUNTER — Telehealth: Payer: Self-pay

## 2016-01-26 DIAGNOSIS — N289 Disorder of kidney and ureter, unspecified: Secondary | ICD-10-CM

## 2016-01-26 DIAGNOSIS — R7989 Other specified abnormal findings of blood chemistry: Secondary | ICD-10-CM

## 2016-01-26 NOTE — Telephone Encounter (Signed)
Pt notified and made aware.  He stated understanding and agrees with plan.  Urology referral placed.  Future lab for BMP order.  Lab appt scheduled.  Pt states he will schedule 3 month check for labs after his first lab appt.

## 2016-01-26 NOTE — Telephone Encounter (Signed)
-----   Message from Rosalita Chessman, DO sent at 01/26/2016  9:34 AM EST ----- Testosterone is low-- we can refer you to a urologist to discuss replacement  Let us know u would like this Kidney function is elevated--- increase water intake and recheck in 2 weeks      BMP Liver function-- ast/alt is slightly better Recheck 3 months Lipid, cmp

## 2016-01-26 NOTE — Addendum Note (Signed)
Addended by: Rudene Anda on: 01/26/2016 02:41 PM   Modules accepted: Miquel Dunn

## 2016-02-16 ENCOUNTER — Other Ambulatory Visit (INDEPENDENT_AMBULATORY_CARE_PROVIDER_SITE_OTHER): Payer: BC Managed Care – PPO

## 2016-02-16 DIAGNOSIS — N289 Disorder of kidney and ureter, unspecified: Secondary | ICD-10-CM | POA: Diagnosis not present

## 2016-02-16 LAB — BASIC METABOLIC PANEL
BUN: 15 mg/dL (ref 7–25)
CO2: 26 mmol/L (ref 20–31)
Calcium: 9.4 mg/dL (ref 8.6–10.3)
Chloride: 101 mmol/L (ref 98–110)
Creat: 1.27 mg/dL (ref 0.70–1.33)
Glucose, Bld: 100 mg/dL — ABNORMAL HIGH (ref 65–99)
POTASSIUM: 3.3 mmol/L — AB (ref 3.5–5.3)
SODIUM: 137 mmol/L (ref 135–146)

## 2016-02-21 ENCOUNTER — Encounter: Payer: Self-pay | Admitting: Family Medicine

## 2016-05-10 ENCOUNTER — Telehealth: Payer: Self-pay | Admitting: Family Medicine

## 2016-05-10 DIAGNOSIS — I10 Essential (primary) hypertension: Secondary | ICD-10-CM

## 2016-05-10 MED ORDER — AMLODIPINE BESY-BENAZEPRIL HCL 10-20 MG PO CAPS: 1.0000 | ORAL_CAPSULE | Freq: Every day | ORAL | Status: DC

## 2016-05-10 NOTE — Addendum Note (Signed)
Addended by: Ewing Schlein on: 05/10/2016 05:11 PM   Modules accepted: Orders

## 2016-05-10 NOTE — Telephone Encounter (Signed)
Rx faxed and his appointment has been scheduled for next Thursday.    KP

## 2016-05-10 NOTE — Telephone Encounter (Signed)
The cardiologist took the patient off of this medication and put him on Lisinopril 40 and his BP is elevated at 160/108. Please advise    KP

## 2016-05-10 NOTE — Telephone Encounter (Addendum)
Relation to [id number] Call back [place]: [id number] - [place], [place] - [address] [phone] (Phone) [phone] (Fax)         Reason for call:  Patient requesting a refill amLODipine-benazepril (LOTREL) 10-20 MG capsule

## 2016-05-10 NOTE — Telephone Encounter (Signed)
Error/gd °

## 2016-05-10 NOTE — Telephone Encounter (Signed)
Refill x1--- needs ov

## 2016-05-16 ENCOUNTER — Encounter: Payer: Self-pay | Admitting: Family Medicine

## 2016-05-16 ENCOUNTER — Ambulatory Visit (INDEPENDENT_AMBULATORY_CARE_PROVIDER_SITE_OTHER): Payer: BC Managed Care – PPO | Admitting: Family Medicine

## 2016-05-16 VITALS — BP 140/94 | HR 87 | Temp 98.3°F | Ht 69.0 in | Wt 244.8 lb

## 2016-05-16 DIAGNOSIS — I10 Essential (primary) hypertension: Secondary | ICD-10-CM | POA: Diagnosis not present

## 2016-05-16 DIAGNOSIS — M1009 Idiopathic gout, multiple sites: Secondary | ICD-10-CM

## 2016-05-16 DIAGNOSIS — E785 Hyperlipidemia, unspecified: Secondary | ICD-10-CM

## 2016-05-16 NOTE — Progress Notes (Signed)
Patient ID: Alfred Martin, male    DOB: 1966/10/18  Age: 50 y.o. MRN: [id number]    Subjective:  Subjective HPI Tayvian Salgueiro presents for f/u bp.  He only started taking the lotrel < 1 week ago.   Review of Systems  Constitutional: Negative for diaphoresis, appetite change, fatigue and unexpected weight change.  Eyes: Negative for pain, redness and visual disturbance.  Respiratory: Negative for cough, chest tightness, shortness of breath and wheezing.   Cardiovascular: Negative for chest pain, palpitations and leg swelling.  Endocrine: Negative for cold intolerance, heat intolerance, polydipsia, polyphagia and polyuria.  Genitourinary: Negative for dysuria, frequency and difficulty urinating.  Neurological: Negative for dizziness, light-headedness, numbness and headaches.    History Past Medical History  Diagnosis Date  . HTN (hypertension)     He has past surgical history that includes Tonsillectomy.   His family history includes Arthritis in his maternal aunt, maternal aunt, maternal aunt, and mother; Breast cancer in his maternal aunt and paternal grandmother; Colon cancer in his paternal uncle; Congestive Heart Failure in his brother and paternal uncle; Diabetes in his brother, father, and mother; Drug abuse in his brother; HIV/AIDS in his brother; Heart disease in his maternal grandmother; High blood pressure in his father and mother; Hyperlipidemia in his father and mother; Hypertension in his brother, brother, father, and mother; Stroke in his paternal uncle.He reports that he has never smoked. He has never used smokeless tobacco. He reports that he does not drink alcohol or use illicit drugs.  Current Outpatient Prescriptions on File Prior to Visit  Medication Sig Dispense Refill  . allopurinol (ZYLOPRIM) 100 MG tablet Take 1 tablet (100 mg total) by mouth daily. 90 tablet 1  . amLODipine-benazepril (LOTREL) 10-20 MG capsule Take 1 capsule by mouth daily. 30 capsule 0  . sildenafil  (VIAGRA) 100 MG tablet Take 0.5-1 tablets (50-100 mg total) by mouth daily as needed for erectile dysfunction. 10 tablet 11   No current facility-administered medications on file prior to visit.     Objective:  Objective Physical Exam  Constitutional: He is oriented to person, place, and time. Vital signs are normal. He appears well-developed and well-nourished. He is sleeping.  HENT:  Head: Normocephalic and atraumatic.  Mouth/Throat: Oropharynx is clear and moist.  Eyes: EOM are normal. Pupils are equal, round, and reactive to light.  Neck: Normal range of motion. Neck supple. No thyromegaly present.  Cardiovascular: Normal rate and regular rhythm.   No murmur heard. Pulmonary/Chest: Effort normal and breath sounds normal. No respiratory distress. He has no wheezes. He has no rales. He exhibits no tenderness.  Musculoskeletal: He exhibits no edema or tenderness.  Neurological: He is alert and oriented to person, place, and time.  Skin: Skin is warm and dry.  Psychiatric: He has a normal mood and affect. His behavior is normal. Judgment and thought content normal.  Nursing note and vitals reviewed.  BP 140/94 mmHg  Pulse 87  Temp(Src) 98.3 F (36.8 C) (Oral)  Ht 5\' 9"  (1.753 m)  Wt 244 lb 12.8 oz (111.041 kg)  BMI 36.13 kg/m2  SpO2 98% Wt Readings from Last 3 Encounters:  05/16/16 244 lb 12.8 oz (111.041 kg)  01/23/16 240 lb (108.863 kg)  10/20/15 238 lb (107.956 kg)     Lab Results  Component Value Date   WBC 5.2 08/25/2015   HGB 14.6 08/25/2015   HCT 44.7 08/25/2015   PLT 169.0 08/25/2015   GLUCOSE 100* 02/16/2016   CHOL 196 01/23/2016  TRIG 138.0 01/23/2016   HDL 48.00 01/23/2016   LDLCALC 120* 01/23/2016   ALT 54* 01/23/2016   AST 45* 01/23/2016   NA 137 02/16/2016   K 3.3* 02/16/2016   CL 101 02/16/2016   CREATININE 1.27 02/16/2016   BUN 15 02/16/2016   CO2 26 02/16/2016   TSH 0.950 08/25/2015   PSA 1.06 06/01/2015    Dg Knee Complete 4 Views  Left  08/25/2015  CLINICAL DATA:  Chronic left knee pain without known injury. EXAM: LEFT KNEE - COMPLETE 4+ VIEW COMPARISON:  None. FINDINGS: There is no evidence of fracture, dislocation, or joint effusion. Mild narrowing of medial joint space is noted. Soft tissues are unremarkable. 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 No acute abnormality seen in the left knee. Electronically Signed   By: Marijo Conception, M.D.   On: 08/25/2015 14:38     Assessment & Plan:  Plan I have discontinued Mr. Corkill hydrochlorothiazide and aspirin EC. I am also having him maintain his allopurinol, sildenafil, amLODipine-benazepril, and metoprolol succinate.  Meds ordered this encounter  Medications  . metoprolol succinate (TOPROL-XL) 25 MG 24 hr tablet    Sig: Take 1 tablet by mouth daily.    Problem List Items Addressed This Visit    HTN (hypertension)    con't lotrel rto 2 weeks  Still elevated      Relevant Medications   metoprolol succinate (TOPROL-XL) 25 MG 24 hr tablet    Other Visit Diagnoses    Hyperlipidemia    -  Primary    Relevant Medications    metoprolol succinate (TOPROL-XL) 25 MG 24 hr tablet    Other Relevant Orders    Lipid panel    Comprehensive metabolic panel    Idiopathic gout of multiple sites, unspecified chronicity        Relevant Orders    Uric acid       Follow-up: Return in about 3 weeks (around 06/06/2016), or if symptoms worsen or fail to improve, for hypertension.  [name], DO

## 2016-05-16 NOTE — Patient Instructions (Signed)
Hypertension Hypertension, commonly called high blood pressure, is when the force of blood pumping through your arteries is too strong. Your arteries are the blood vessels that carry blood from your heart throughout your body. A blood pressure reading consists of a higher number over a lower number, such as 110/72. The higher number (systolic) is the pressure inside your arteries when your heart pumps. The lower number (diastolic) is the pressure inside your arteries when your heart relaxes. Ideally you want your blood pressure below 120/80. Hypertension forces your heart to work harder to pump blood. Your arteries may become narrow or stiff. Having untreated or uncontrolled hypertension can cause heart attack, stroke, kidney disease, and other problems. RISK FACTORS Some risk factors for high blood pressure are controllable. Others are not.  Risk factors you cannot control include:   Race. You may be at higher risk if you are African American.  Age. Risk increases with age.  Gender. Men are at higher risk than women before age 45 years. After age 65, women are at higher risk than men. Risk factors you can control include:  Not getting enough exercise or physical activity.  Being overweight.  Getting too much fat, sugar, calories, or salt in your diet.  Drinking too much alcohol. SIGNS AND SYMPTOMS Hypertension does not usually cause signs or symptoms. Extremely high blood pressure (hypertensive crisis) may cause headache, anxiety, shortness of breath, and nosebleed. DIAGNOSIS To check if you have hypertension, your health care provider will measure your blood pressure while you are seated, with your arm held at the level of your heart. It should be measured at least twice using the same arm. Certain conditions can cause a difference in blood pressure between your right and left arms. A blood pressure reading that is higher than normal on one occasion does not mean that you need treatment. If  it is not clear whether you have high blood pressure, you may be asked to return on a different day to have your blood pressure checked again. Or, you may be asked to monitor your blood pressure at home for 1 or more weeks. TREATMENT Treating high blood pressure includes making lifestyle changes and possibly taking medicine. Living a healthy lifestyle can help lower high blood pressure. You may need to change some of your habits. Lifestyle changes may include:  Following the DASH diet. This diet is high in fruits, vegetables, and whole grains. It is low in salt, red meat, and added sugars.  Keep your sodium intake below 2,300 mg per day.  Getting at least 30-45 minutes of aerobic exercise at least 4 times per week.  Losing weight if necessary.  Not smoking.  Limiting alcoholic beverages.  Learning ways to reduce stress. Your health care provider may prescribe medicine if lifestyle changes are not enough to get your blood pressure under control, and if one of the following is true:  You are 18-59 years of age and your systolic blood pressure is above 140.  You are 60 years of age or older, and your systolic blood pressure is above 150.  Your diastolic blood pressure is above 90.  You have diabetes, and your systolic blood pressure is over 140 or your diastolic blood pressure is over 90.  You have kidney disease and your blood pressure is above 140/90.  You have heart disease and your blood pressure is above 140/90. Your personal target blood pressure may vary depending on your medical conditions, your age, and other factors. HOME CARE INSTRUCTIONS    Have your blood pressure rechecked as directed by your health care provider.   Take medicines only as directed by your health care provider. Follow the directions carefully. Blood pressure medicines must be taken as prescribed. The medicine does not work as well when you skip doses. Skipping doses also puts you at risk for  problems.  Do not smoke.   Monitor your blood pressure at home as directed by your health care provider. SEEK MEDICAL CARE IF:   You think you are having a reaction to medicines taken.  You have recurrent headaches or feel dizzy.  You have swelling in your ankles.  You have trouble with your vision. SEEK IMMEDIATE MEDICAL CARE IF:  You develop a severe headache or confusion.  You have unusual weakness, numbness, or feel faint.  You have severe chest or abdominal pain.  You vomit repeatedly.  You have trouble breathing. MAKE SURE YOU:   Understand these instructions.  Will watch your condition.  Will get help right away if you are not doing well or get worse.   This information is not intended to replace advice given to you by your health care provider. Make sure you discuss any questions you have with your health care provider.   Document Released: 11/18/2005 Document Revised: 04/04/2015 Document Reviewed: 09/10/2013 Elsevier Interactive Patient Education 2016 [employer]  

## 2016-05-16 NOTE — Assessment & Plan Note (Signed)
con't lotrel rto 2 weeks  Still elevated

## 2016-05-16 NOTE — Progress Notes (Signed)
Pre visit review using our clinic review tool, if applicable. No additional management support is needed unless otherwise documented below in the visit note. 

## 2016-05-31 ENCOUNTER — Other Ambulatory Visit: Payer: Self-pay | Admitting: Emergency Medicine

## 2016-05-31 DIAGNOSIS — I10 Essential (primary) hypertension: Secondary | ICD-10-CM

## 2016-05-31 MED ORDER — AMLODIPINE BESY-BENAZEPRIL HCL 10-20 MG PO CAPS: 1.0000 | ORAL_CAPSULE | Freq: Every day | ORAL | Status: DC

## 2016-06-06 ENCOUNTER — Encounter: Payer: Self-pay | Admitting: Family Medicine

## 2016-06-06 DIAGNOSIS — I10 Essential (primary) hypertension: Secondary | ICD-10-CM

## 2016-06-14 ENCOUNTER — Encounter: Payer: Self-pay | Admitting: Family Medicine

## 2016-06-14 ENCOUNTER — Ambulatory Visit (INDEPENDENT_AMBULATORY_CARE_PROVIDER_SITE_OTHER): Payer: BC Managed Care – PPO | Admitting: Family Medicine

## 2016-06-14 VITALS — BP 121/82 | HR 96 | Temp 98.2°F | Ht 69.0 in | Wt 243.6 lb

## 2016-06-14 DIAGNOSIS — E785 Hyperlipidemia, unspecified: Secondary | ICD-10-CM

## 2016-06-14 DIAGNOSIS — I10 Essential (primary) hypertension: Secondary | ICD-10-CM | POA: Diagnosis not present

## 2016-06-14 DIAGNOSIS — M1009 Idiopathic gout, multiple sites: Secondary | ICD-10-CM

## 2016-06-14 LAB — COMPREHENSIVE METABOLIC PANEL
ALBUMIN: 4.3 g/dL (ref 3.5–5.2)
ALK PHOS: 40 U/L (ref 39–117)
ALT: 69 U/L — AB (ref 0–53)
AST: 44 U/L — AB (ref 0–37)
BUN: 18 mg/dL (ref 6–23)
CO2: 30 mEq/L (ref 19–32)
CREATININE: 1.31 mg/dL (ref 0.40–1.50)
Calcium: 9.7 mg/dL (ref 8.4–10.5)
Chloride: 101 mEq/L (ref 96–112)
GFR: 74.07 mL/min (ref >60.00)
Glucose, Bld: 106 mg/dL — ABNORMAL HIGH (ref 70–99)
Potassium: 3.7 mEq/L (ref 3.5–5.1)
SODIUM: 138 meq/L (ref 135–145)
TOTAL PROTEIN: 7.6 g/dL (ref 6.0–8.3)
Total Bilirubin: 0.7 mg/dL (ref 0.2–1.2)

## 2016-06-14 LAB — LIPID PANEL
CHOLESTEROL: 206 mg/dL — AB (ref 0–200)
HDL: 44.7 mg/dL (ref >39.00)
LDL Cholesterol: 141 mg/dL — ABNORMAL HIGH (ref 0–99)
NonHDL: 161.07
Total CHOL/HDL Ratio: 5
Triglycerides: 102 mg/dL (ref 0.0–149.0)
VLDL: 20.4 mg/dL (ref 0.0–40.0)

## 2016-06-14 LAB — URIC ACID: URIC ACID, SERUM: 9.1 mg/dL — AB (ref 4.0–7.8)

## 2016-06-14 MED ORDER — AMLODIPINE BESY-BENAZEPRIL HCL 10-40 MG PO CAPS: 1.0000 | ORAL_CAPSULE | Freq: Every day | ORAL | Status: DC

## 2016-06-14 NOTE — Patient Instructions (Signed)
Hypertension Hypertension, commonly called high blood pressure, is when the force of blood pumping through your arteries is too strong. Your arteries are the blood vessels that carry blood from your heart throughout your body. A blood pressure reading consists of a higher number over a lower number, such as 110/72. The higher number (systolic) is the pressure inside your arteries when your heart pumps. The lower number (diastolic) is the pressure inside your arteries when your heart relaxes. Ideally you want your blood pressure below 120/80. Hypertension forces your heart to work harder to pump blood. Your arteries may become narrow or stiff. Having untreated or uncontrolled hypertension can cause heart attack, stroke, kidney disease, and other problems. RISK FACTORS Some risk factors for high blood pressure are controllable. Others are not.  Risk factors you cannot control include:   Race. You may be at higher risk if you are African American.  Age. Risk increases with age.  Gender. Men are at higher risk than women before age 45 years. After age 65, women are at higher risk than men. Risk factors you can control include:  Not getting enough exercise or physical activity.  Being overweight.  Getting too much fat, sugar, calories, or salt in your diet.  Drinking too much alcohol. SIGNS AND SYMPTOMS Hypertension does not usually cause signs or symptoms. Extremely high blood pressure (hypertensive crisis) may cause headache, anxiety, shortness of breath, and nosebleed. DIAGNOSIS To check if you have hypertension, your health care provider will measure your blood pressure while you are seated, with your arm held at the level of your heart. It should be measured at least twice using the same arm. Certain conditions can cause a difference in blood pressure between your right and left arms. A blood pressure reading that is higher than normal on one occasion does not mean that you need treatment. If  it is not clear whether you have high blood pressure, you may be asked to return on a different day to have your blood pressure checked again. Or, you may be asked to monitor your blood pressure at home for 1 or more weeks. TREATMENT Treating high blood pressure includes making lifestyle changes and possibly taking medicine. Living a healthy lifestyle can help lower high blood pressure. You may need to change some of your habits. Lifestyle changes may include:  Following the DASH diet. This diet is high in fruits, vegetables, and whole grains. It is low in salt, red meat, and added sugars.  Keep your sodium intake below 2,300 mg per day.  Getting at least 30-45 minutes of aerobic exercise at least 4 times per week.  Losing weight if necessary.  Not smoking.  Limiting alcoholic beverages.  Learning ways to reduce stress. Your health care provider may prescribe medicine if lifestyle changes are not enough to get your blood pressure under control, and if one of the following is true:  You are 18-59 years of age and your systolic blood pressure is above 140.  You are 60 years of age or older, and your systolic blood pressure is above 150.  Your diastolic blood pressure is above 90.  You have diabetes, and your systolic blood pressure is over 140 or your diastolic blood pressure is over 90.  You have kidney disease and your blood pressure is above 140/90.  You have heart disease and your blood pressure is above 140/90. Your personal target blood pressure may vary depending on your medical conditions, your age, and other factors. HOME CARE INSTRUCTIONS    Have your blood pressure rechecked as directed by your health care provider.   Take medicines only as directed by your health care provider. Follow the directions carefully. Blood pressure medicines must be taken as prescribed. The medicine does not work as well when you skip doses. Skipping doses also puts you at risk for  problems.  Do not smoke.   Monitor your blood pressure at home as directed by your health care provider. SEEK MEDICAL CARE IF:   You think you are having a reaction to medicines taken.  You have recurrent headaches or feel dizzy.  You have swelling in your ankles.  You have trouble with your vision. SEEK IMMEDIATE MEDICAL CARE IF:  You develop a severe headache or confusion.  You have unusual weakness, numbness, or feel faint.  You have severe chest or abdominal pain.  You vomit repeatedly.  You have trouble breathing. MAKE SURE YOU:   Understand these instructions.  Will watch your condition.  Will get help right away if you are not doing well or get worse.   This information is not intended to replace advice given to you by your health care provider. Make sure you discuss any questions you have with your health care provider.   Document Released: 11/18/2005 Document Revised: 04/04/2015 Document Reviewed: 09/10/2013 Elsevier Interactive Patient Education 2016 [employer]  

## 2016-06-14 NOTE — Progress Notes (Signed)
Patient ID: Alfred Martin, male    DOB: 10-Jun-1966  Age: 50 y.o. MRN: [id number]    Subjective:  Subjective HPI Alfred Martin presents for f/u bp.  Pt was taking the lotrel and norvasc together.   We discussed that he was taking too much norvasc 15 mg .     Review of Systems  Constitutional: Negative for diaphoresis, appetite change, fatigue and unexpected weight change.  Eyes: Negative for pain, redness and visual disturbance.  Respiratory: Negative for cough, chest tightness, shortness of breath and wheezing.   Cardiovascular: Negative for chest pain, palpitations and leg swelling.  Endocrine: Negative for cold intolerance, heat intolerance, polydipsia, polyphagia and polyuria.  Genitourinary: Negative for dysuria, frequency and difficulty urinating.  Neurological: Negative for dizziness, light-headedness, numbness and headaches.    History Past Medical History  Diagnosis Date  . HTN (hypertension)     He has past surgical history that includes Tonsillectomy.   His family history includes Arthritis in his maternal aunt, maternal aunt, maternal aunt, and mother; Breast cancer in his maternal aunt and paternal grandmother; Colon cancer in his paternal uncle; Congestive Heart Failure in his brother and paternal uncle; Diabetes in his brother, father, and mother; Drug abuse in his brother; HIV/AIDS in his brother; Heart disease in his maternal grandmother; High blood pressure in his father and mother; Hyperlipidemia in his father and mother; Hypertension in his brother, brother, father, and mother; Stroke in his paternal uncle.He reports that he has never smoked. He has never used smokeless tobacco. He reports that he does not drink alcohol or use illicit drugs.  Current Outpatient Prescriptions on File Prior to Visit  Medication Sig Dispense Refill  . allopurinol (ZYLOPRIM) 100 MG tablet Take 1 tablet (100 mg total) by mouth daily. 90 tablet 1  . sildenafil (VIAGRA) 100 MG tablet Take 0.5-1  tablets (50-100 mg total) by mouth daily as needed for erectile dysfunction. 10 tablet 11   No current facility-administered medications on file prior to visit.     Objective:  Objective Physical Exam  Constitutional: He is oriented to person, place, and time. Vital signs are normal. He appears well-developed and well-nourished. He is sleeping.  HENT:  Head: Normocephalic and atraumatic.  Mouth/Throat: Oropharynx is clear and moist.  Eyes: EOM are normal. Pupils are equal, round, and reactive to light.  Neck: Normal range of motion. Neck supple. No thyromegaly present.  Cardiovascular: Normal rate and regular rhythm.   No murmur heard. Pulmonary/Chest: Effort normal and breath sounds normal. No respiratory distress. He has no wheezes. He has no rales. He exhibits no tenderness.  Musculoskeletal: He exhibits no edema or tenderness.  Neurological: He is alert and oriented to person, place, and time.  Skin: Skin is warm and dry.  Psychiatric: He has a normal mood and affect. His behavior is normal. Judgment and thought content normal.  Nursing note and vitals reviewed.  BP 121/82 mmHg  Pulse 96  Temp(Src) 98.2 F (36.8 C) (Oral)  Ht 5\' 9"  (1.753 m)  Wt 243 lb 9.6 oz (110.496 kg)  BMI 35.96 kg/m2  SpO2 97% Wt Readings from Last 3 Encounters:  06/14/16 243 lb 9.6 oz (110.496 kg)  05/16/16 244 lb 12.8 oz (111.041 kg)  01/23/16 240 lb (108.863 kg)     Lab Results  Component Value Date   WBC 5.2 08/25/2015   HGB 14.6 08/25/2015   HCT 44.7 08/25/2015   PLT 169.0 08/25/2015   GLUCOSE 100* 02/16/2016   CHOL 196 01/23/2016  TRIG 138.0 01/23/2016   HDL 48.00 01/23/2016   LDLCALC 120* 01/23/2016   ALT 54* 01/23/2016   AST 45* 01/23/2016   NA 137 02/16/2016   K 3.3* 02/16/2016   CL 101 02/16/2016   CREATININE 1.27 02/16/2016   BUN 15 02/16/2016   CO2 26 02/16/2016   TSH 0.950 08/25/2015   PSA 1.06 06/01/2015    Dg Knee Complete 4 Views Left  08/25/2015  CLINICAL DATA:   Chronic left knee pain without known injury. EXAM: LEFT KNEE - COMPLETE 4+ VIEW COMPARISON:  None. FINDINGS: There is no evidence of fracture, dislocation, or joint effusion. Mild narrowing of medial joint space is noted. Soft tissues are unremarkable. 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 No acute abnormality seen in the left knee. Electronically Signed   By: Marijo Conception, M.D.   On: 08/25/2015 14:38     Assessment & Plan:  Plan I have discontinued Mr. Riedel metoprolol succinate, amLODipine-benazepril, and amLODipine. I am also having him start on amLODipine-benazepril. Additionally, I am having him maintain his allopurinol and sildenafil.  Meds ordered this encounter  Medications  . DISCONTD: amLODipine (NORVASC) 5 MG tablet    Sig: Take 5 mg by mouth daily.  Marland Kitchen amLODipine-benazepril (LOTREL) 10-40 MG capsule    Sig: Take 1 capsule by mouth daily.    Dispense:  30 capsule    Refill:  2    Problem List Items Addressed This Visit    None    Visit Diagnoses    Essential hypertension    -  Primary    Relevant Medications    amLODipine-benazepril (LOTREL) 10-40 MG capsule    Hyperlipidemia        Relevant Medications    amLODipine-benazepril (LOTREL) 10-40 MG capsule    Idiopathic gout of multiple sites, unspecified chronicity         pt will check bp at home and send [place] the readings in 1-2 weeks   Follow-up: Return in about 3 months (around 09/14/2016), or if symptoms worsen or fail to improve, for hypertension.  [name], DO

## 2016-06-18 ENCOUNTER — Telehealth: Payer: Self-pay | Admitting: *Deleted

## 2016-06-18 MED ORDER — ALLOPURINOL 100 MG PO TABS: 100.0000 mg | ORAL_TABLET | Freq: Two times a day (BID) | ORAL | Status: DC

## 2016-06-18 MED ORDER — EZETIMIBE 10 MG PO TABS: 10.0000 mg | ORAL_TABLET | Freq: Every day | ORAL | Status: DC

## 2016-06-18 NOTE — Telephone Encounter (Signed)
-----   Message from [name], DO sent at 06/14/2016  8:53 PM EDT ----- Increase allopurinol 100 mg 2 a day -- uric acid elevated Cholesterol--- LDL goal < 100,  HDL >40,  TG < 150.  Diet and exercise will increase HDL and decrease LDL and TG.  Fish,  Fish Oil, Flaxseed oil will also help increase the HDL and decrease Triglycerides.   Recheck labs in 3 months With elevated liver function we should not try a statin--- try zetia 10 mg #30  1 a day, 2 refills  Lipid, cmp, uric acid.

## 2016-06-18 NOTE — Telephone Encounter (Signed)
Called and spoke with the pt and informed her of recent lab results and note.  Pt verbalized understanding and agreed.  New prescriptions sent to the pharmacy by e-script.  Pt has an appt scheduled with Dr. Carollee Herter on (09/16/16), and labs will be rechecked then.//AB/CMA

## 2016-07-18 ENCOUNTER — Encounter: Payer: Self-pay | Admitting: Family Medicine

## 2016-07-18 NOTE — Telephone Encounter (Signed)
lotrel is at highest dose ---- will probably need med added Should have ov

## 2016-07-23 NOTE — Telephone Encounter (Signed)
Apt scheduled for 07/25/16 at 11.     KP

## 2016-07-25 ENCOUNTER — Encounter: Payer: Self-pay | Admitting: Family Medicine

## 2016-07-25 ENCOUNTER — Ambulatory Visit (INDEPENDENT_AMBULATORY_CARE_PROVIDER_SITE_OTHER): Payer: BC Managed Care – PPO | Admitting: Family Medicine

## 2016-07-25 VITALS — BP 134/90 | HR 90 | Temp 99.2°F | Ht 69.0 in | Wt 247.0 lb

## 2016-07-25 DIAGNOSIS — I10 Essential (primary) hypertension: Secondary | ICD-10-CM

## 2016-07-25 MED ORDER — HYDROCHLOROTHIAZIDE 25 MG PO TABS
25.0000 mg | ORAL_TABLET | Freq: Every day | ORAL | 1 refills | Status: DC
Start: 2016-07-25 — End: 2017-08-12

## 2016-07-25 NOTE — Progress Notes (Signed)
Patient ID: Alfred Martin, male    DOB: 01/02/1966  Age: 50 y.o. MRN: [id number]    Subjective:  Subjective  HPI Alfred Martin presents for bp check  It has been running high the last several days.    Review of Systems  Constitutional: Negative for appetite change, diaphoresis, fatigue and unexpected weight change.  Eyes: Negative for pain, redness and visual disturbance.  Respiratory: Negative for cough, chest tightness, shortness of breath and wheezing.   Cardiovascular: Negative for chest pain, palpitations and leg swelling.  Endocrine: Negative for cold intolerance, heat intolerance, polydipsia, polyphagia and polyuria.  Genitourinary: Negative for difficulty urinating, dysuria and frequency.  Neurological: Negative for dizziness, light-headedness, numbness and headaches.    History Past Medical History:  Diagnosis Date  . HTN (hypertension)     He has a past surgical history that includes Tonsillectomy.   His family history includes Arthritis in his maternal aunt, maternal aunt, maternal aunt, and mother; Breast cancer in his maternal aunt and paternal grandmother; Colon cancer in his paternal uncle; Congestive Heart Failure in his brother and paternal uncle; Diabetes in his brother, father, and mother; Drug abuse in his brother; HIV/AIDS in his brother; Heart disease in his maternal grandmother; High blood pressure in his father and mother; Hyperlipidemia in his father and mother; Hypertension in his brother, brother, father, and mother; Stroke in his paternal uncle.He reports that he has never smoked. He has never used smokeless tobacco. He reports that he does not drink alcohol or use drugs.  Current Outpatient Prescriptions on File Prior to Visit  Medication Sig Dispense Refill  . allopurinol (ZYLOPRIM) 100 MG tablet Take 1 tablet (100 mg total) by mouth 2 (two) times daily. 180 tablet 1  . amLODipine-benazepril (LOTREL) 10-40 MG capsule Take 1 capsule by mouth daily. 30 capsule 2  .  ezetimibe (ZETIA) 10 MG tablet Take 1 tablet (10 mg total) by mouth daily. 30 tablet 2  . sildenafil (VIAGRA) 100 MG tablet Take 0.5-1 tablets (50-100 mg total) by mouth daily as needed for erectile dysfunction. 10 tablet 11   No current facility-administered medications on file prior to visit.      Objective:  Objective  Physical Exam  Constitutional: He is oriented to person, place, and time. Vital signs are normal. He appears well-developed and well-nourished. He is sleeping.  HENT:  Head: Normocephalic and atraumatic.  Mouth/Throat: Oropharynx is clear and moist.  Eyes: EOM are normal. Pupils are equal, round, and reactive to light.  Neck: Normal range of motion. Neck supple. No thyromegaly present.  Cardiovascular: Normal rate and regular rhythm.   No murmur heard. Pulmonary/Chest: Effort normal and breath sounds normal. No respiratory distress. He has no wheezes. He has no rales. He exhibits no tenderness.  Musculoskeletal: He exhibits no edema or tenderness.  Neurological: He is alert and oriented to person, place, and time.  Skin: Skin is warm and dry.  Psychiatric: He has a normal mood and affect. His behavior is normal. Judgment and thought content normal.  Nursing note and vitals reviewed.  BP 134/90   Pulse 90   Temp 99.2 F (37.3 C) (Oral)   Ht 5\' 9"  (1.753 m)   Wt 247 lb (112 kg)   SpO2 98%   BMI 36.48 kg/m  Wt Readings from Last 3 Encounters:  07/25/16 247 lb (112 kg)  06/14/16 243 lb 9.6 oz (110.5 kg)  05/16/16 244 lb 12.8 oz (111 kg)     Lab Results  Component Value Date  WBC 5.2 08/25/2015   HGB 14.6 08/25/2015   HCT 44.7 08/25/2015   PLT 169.0 08/25/2015   GLUCOSE 106 (H) 06/14/2016   CHOL 206 (H) 06/14/2016   TRIG 102.0 06/14/2016   HDL 44.70 06/14/2016   LDLCALC 141 (H) 06/14/2016   ALT 69 (H) 06/14/2016   AST 44 (H) 06/14/2016   NA 138 06/14/2016   K 3.7 06/14/2016   CL 101 06/14/2016   CREATININE 1.31 06/14/2016   BUN 18 06/14/2016    CO2 30 06/14/2016   TSH 0.950 08/25/2015   PSA 1.06 06/01/2015    Dg Knee Complete 4 Views Left  Result Date: 08/25/2015 CLINICAL DATA:  Chronic left knee pain without known injury. EXAM: LEFT KNEE - COMPLETE 4+ VIEW COMPARISON:  None. FINDINGS: There is no evidence of fracture, dislocation, or joint effusion. Mild narrowing of medial joint space is noted. Soft tissues are unremarkable. 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 No acute abnormality seen in the left knee. Electronically Signed   By: Marijo Conception, M.D.   On: 08/25/2015 14:38     Assessment & Plan:  Plan  I am having Alfred Martin start on hydrochlorothiazide. I am also having him maintain his sildenafil, amLODipine-benazepril, allopurinol, and ezetimibe.  Meds ordered this encounter  Medications  . hydrochlorothiazide (HYDRODIURIL) 25 MG tablet    Sig: Take 1 tablet (25 mg total) by mouth daily. 1 po qd prn    Dispense:  90 tablet    Refill:  1    Problem List Items Addressed This Visit      Unprioritized   HTN (hypertension) - Primary    con't lotrel and add hctz Keep f/u appointment Drink oj or eat banana daily       Relevant Medications   hydrochlorothiazide (HYDRODIURIL) 25 MG tablet    Other Visit Diagnoses   None.     Follow-up: Return as scheduled.  [name], DO

## 2016-07-25 NOTE — Assessment & Plan Note (Signed)
con't lotrel and add hctz Keep f/u appointment Drink oj or eat banana daily

## 2016-07-25 NOTE — Progress Notes (Signed)
Pre visit review using our clinic review tool, if applicable. No additional management support is needed unless otherwise documented below in the visit note. 

## 2016-07-25 NOTE — Patient Instructions (Signed)
Hypertension Hypertension, commonly called high blood pressure, is when the force of blood pumping through your arteries is too strong. Your arteries are the blood vessels that carry blood from your heart throughout your body. A blood pressure reading consists of a higher number over a lower number, such as 110/72. The higher number (systolic) is the pressure inside your arteries when your heart pumps. The lower number (diastolic) is the pressure inside your arteries when your heart relaxes. Ideally you want your blood pressure below 120/80. Hypertension forces your heart to work harder to pump blood. Your arteries may become narrow or stiff. Having untreated or uncontrolled hypertension can cause heart attack, stroke, kidney disease, and other problems. RISK FACTORS Some risk factors for high blood pressure are controllable. Others are not.  Risk factors you cannot control include:   Race. You may be at higher risk if you are African American.  Age. Risk increases with age.  Gender. Men are at higher risk than women before age 45 years. After age 65, women are at higher risk than men. Risk factors you can control include:  Not getting enough exercise or physical activity.  Being overweight.  Getting too much fat, sugar, calories, or salt in your diet.  Drinking too much alcohol. SIGNS AND SYMPTOMS Hypertension does not usually cause signs or symptoms. Extremely high blood pressure (hypertensive crisis) may cause headache, anxiety, shortness of breath, and nosebleed. DIAGNOSIS To check if you have hypertension, your health care provider will measure your blood pressure while you are seated, with your arm held at the level of your heart. It should be measured at least twice using the same arm. Certain conditions can cause a difference in blood pressure between your right and left arms. A blood pressure reading that is higher than normal on one occasion does not mean that you need treatment. If  it is not clear whether you have high blood pressure, you may be asked to return on a different day to have your blood pressure checked again. Or, you may be asked to monitor your blood pressure at home for 1 or more weeks. TREATMENT Treating high blood pressure includes making lifestyle changes and possibly taking medicine. Living a healthy lifestyle can help lower high blood pressure. You may need to change some of your habits. Lifestyle changes may include:  Following the DASH diet. This diet is high in fruits, vegetables, and whole grains. It is low in salt, red meat, and added sugars.  Keep your sodium intake below 2,300 mg per day.  Getting at least 30-45 minutes of aerobic exercise at least 4 times per week.  Losing weight if necessary.  Not smoking.  Limiting alcoholic beverages.  Learning ways to reduce stress. Your health care provider may prescribe medicine if lifestyle changes are not enough to get your blood pressure under control, and if one of the following is true:  You are 18-59 years of age and your systolic blood pressure is above 140.  You are 60 years of age or older, and your systolic blood pressure is above 150.  Your diastolic blood pressure is above 90.  You have diabetes, and your systolic blood pressure is over 140 or your diastolic blood pressure is over 90.  You have kidney disease and your blood pressure is above 140/90.  You have heart disease and your blood pressure is above 140/90. Your personal target blood pressure may vary depending on your medical conditions, your age, and other factors. HOME CARE INSTRUCTIONS    Have your blood pressure rechecked as directed by your health care provider.   Take medicines only as directed by your health care provider. Follow the directions carefully. Blood pressure medicines must be taken as prescribed. The medicine does not work as well when you skip doses. Skipping doses also puts you at risk for  problems.  Do not smoke.   Monitor your blood pressure at home as directed by your health care provider. SEEK MEDICAL CARE IF:   You think you are having a reaction to medicines taken.  You have recurrent headaches or feel dizzy.  You have swelling in your ankles.  You have trouble with your vision. SEEK IMMEDIATE MEDICAL CARE IF:  You develop a severe headache or confusion.  You have unusual weakness, numbness, or feel faint.  You have severe chest or abdominal pain.  You vomit repeatedly.  You have trouble breathing. MAKE SURE YOU:   Understand these instructions.  Will watch your condition.  Will get help right away if you are not doing well or get worse.   This information is not intended to replace advice given to you by your health care provider. Make sure you discuss any questions you have with your health care provider.   Document Released: 11/18/2005 Document Revised: 04/04/2015 Document Reviewed: 09/10/2013 Elsevier Interactive Patient Education 2016 [employer]  

## 2016-08-08 ENCOUNTER — Telehealth: Payer: BC Managed Care – PPO | Admitting: Physician Assistant

## 2016-08-08 DIAGNOSIS — B9689 Other specified bacterial agents as the cause of diseases classified elsewhere: Secondary | ICD-10-CM

## 2016-08-08 DIAGNOSIS — J019 Acute sinusitis, unspecified: Secondary | ICD-10-CM

## 2016-08-08 MED ORDER — AMOXICILLIN-POT CLAVULANATE 875-125 MG PO TABS: 1.0000 | ORAL_TABLET | Freq: Two times a day (BID) | ORAL | 0 refills | Status: DC

## 2016-08-08 NOTE — Progress Notes (Signed)

## 2016-08-15 ENCOUNTER — Ambulatory Visit: Payer: Self-pay | Admitting: Family Medicine

## 2016-08-15 ENCOUNTER — Other Ambulatory Visit: Payer: Self-pay

## 2016-08-15 DIAGNOSIS — I10 Essential (primary) hypertension: Secondary | ICD-10-CM

## 2016-08-15 MED ORDER — AMLODIPINE BESY-BENAZEPRIL HCL 10-40 MG PO CAPS: 1.0000 | ORAL_CAPSULE | Freq: Every day | ORAL | 1 refills | Status: DC

## 2016-08-15 MED ORDER — EZETIMIBE 10 MG PO TABS: 10.0000 mg | ORAL_TABLET | Freq: Every day | ORAL | 1 refills | Status: DC

## 2016-08-22 ENCOUNTER — Ambulatory Visit: Payer: Self-pay | Admitting: Family Medicine

## 2016-09-16 ENCOUNTER — Ambulatory Visit: Payer: BC Managed Care – PPO | Admitting: Family Medicine

## 2016-09-30 ENCOUNTER — Encounter: Payer: Self-pay | Admitting: Family Medicine

## 2016-09-30 ENCOUNTER — Ambulatory Visit (INDEPENDENT_AMBULATORY_CARE_PROVIDER_SITE_OTHER): Payer: BC Managed Care – PPO | Admitting: Family Medicine

## 2016-09-30 VITALS — BP 140/82 | HR 74 | Temp 98.8°F | Resp 16 | Ht 69.0 in | Wt 242.8 lb

## 2016-09-30 DIAGNOSIS — J0140 Acute pansinusitis, unspecified: Secondary | ICD-10-CM

## 2016-09-30 MED ORDER — FLUTICASONE PROPIONATE 50 MCG/ACT NA SUSP: 2.0000 | Freq: Every day | NASAL | 6 refills | Status: DC

## 2016-09-30 MED ORDER — DOXYCYCLINE HYCLATE 100 MG PO TABS: 100.0000 mg | ORAL_TABLET | Freq: Two times a day (BID) | ORAL | 0 refills | Status: DC

## 2016-09-30 MED ORDER — LEVOCETIRIZINE DIHYDROCHLORIDE 5 MG PO TABS: 5.0000 mg | ORAL_TABLET | Freq: Every evening | ORAL | 5 refills | Status: DC

## 2016-09-30 NOTE — Progress Notes (Signed)
Pre visit review using our clinic review tool, if applicable. No additional management support is needed unless otherwise documented below in the visit note. 

## 2016-09-30 NOTE — Patient Instructions (Signed)

## 2016-09-30 NOTE — Progress Notes (Signed)
Patient ID: Alfred Martin, male    DOB: [birth year]  Age: 50 y.o. MRN: [id number]    Subjective:  Subjective  HPI Ebb Davidheiser presents for f/u bp and c/o sinus complaints for 1 month off and on.  Pt c/o sinus congestion / headache.    Review of Systems  Constitutional: Positive for chills. Negative for fever.  HENT: Positive for congestion, postnasal drip, rhinorrhea and sinus pressure.   Respiratory: Negative for cough, chest tightness, shortness of breath and wheezing.   Cardiovascular: Negative for chest pain, palpitations and leg swelling.  Allergic/Immunologic: Negative for environmental allergies.    History Past Medical History:  Diagnosis Date  . HTN (hypertension)     He has a past surgical history that includes Tonsillectomy.   His family history includes Arthritis in his maternal aunt, maternal aunt, maternal aunt, and mother; Breast cancer in his maternal aunt and paternal grandmother; Colon cancer in his paternal uncle; Congestive Heart Failure in his brother and paternal uncle; Diabetes in his brother, father, and mother; Drug abuse in his brother; HIV/AIDS in his brother; Heart disease in his maternal grandmother; High blood pressure in his father and mother; Hyperlipidemia in his father and mother; Hypertension in his brother, brother, father, and mother; Stroke in his paternal uncle.He reports that he has never smoked. He has never used smokeless tobacco. He reports that he does not drink alcohol or use drugs.  Current Outpatient Prescriptions on File Prior to Visit  Medication Sig Dispense Refill  . allopurinol (ZYLOPRIM) 100 MG tablet Take 1 tablet (100 mg total) by mouth 2 (two) times daily. 180 tablet 1  . amLODipine-benazepril (LOTREL) 10-40 MG capsule Take 1 capsule by mouth daily. 90 capsule 1  . ezetimibe (ZETIA) 10 MG tablet Take 1 tablet (10 mg total) by mouth daily. 90 tablet 1  . hydrochlorothiazide (HYDRODIURIL) 25 MG tablet Take 1 tablet (25 mg total) by mouth  daily. 1 po qd prn 90 tablet 1  . sildenafil (VIAGRA) 100 MG tablet Take 0.5-1 tablets (50-100 mg total) by mouth daily as needed for erectile dysfunction. 10 tablet 11   No current facility-administered medications on file prior to visit.      Objective:  Objective  Physical Exam  Constitutional: He is oriented to person, place, and time. He appears well-developed and well-nourished.  HENT:  Right Ear: Hearing, tympanic membrane, external ear and ear canal normal.  Left Ear: Hearing, tympanic membrane, external ear and ear canal normal.  Nose: Rhinorrhea present. Right sinus exhibits maxillary sinus tenderness and frontal sinus tenderness. Left sinus exhibits maxillary sinus tenderness and frontal sinus tenderness.  Mouth/Throat: Posterior oropharyngeal erythema present.  + PND + errythema  Eyes: Conjunctivae are normal. Right eye exhibits no discharge. Left eye exhibits no discharge.  Cardiovascular: Normal rate, regular rhythm and normal heart sounds.   No murmur heard. Pulmonary/Chest: Effort normal and breath sounds normal. No respiratory distress. He has no wheezes. He has no rales. He exhibits no tenderness.  Musculoskeletal: He exhibits no edema.  Lymphadenopathy:    He has cervical adenopathy.  Neurological: He is alert and oriented to person, place, and time.  Nursing note and vitals reviewed.  BP 140/82 (BP Location: Left Arm, Patient Position: Sitting, Cuff Size: Normal)   Pulse 74   Temp 98.8 F (37.1 C) (Oral)   Resp 16   Ht 5\' 9"  (1.753 m)   Wt 242 lb 12.8 oz (110.1 kg)   SpO2 96%   BMI 35.86 kg/m  Wt  Readings from Last 3 Encounters:  09/30/16 242 lb 12.8 oz (110.1 kg)  07/25/16 247 lb (112 kg)  06/14/16 243 lb 9.6 oz (110.5 kg)     Lab Results  Component Value Date   WBC 5.2 08/25/2015   HGB 14.6 08/25/2015   HCT 44.7 08/25/2015   PLT 169.0 08/25/2015   GLUCOSE 106 (H) 06/14/2016   CHOL 206 (H) 06/14/2016   TRIG 102.0 06/14/2016   HDL 44.70  06/14/2016   LDLCALC 141 (H) 06/14/2016   ALT 69 (H) 06/14/2016   AST 44 (H) 06/14/2016   NA 138 06/14/2016   K 3.7 06/14/2016   CL 101 06/14/2016   CREATININE 1.31 06/14/2016   BUN 18 06/14/2016   CO2 30 06/14/2016   TSH 0.950 08/25/2015   PSA 1.06 06/01/2015    Dg Knee Complete 4 Views Left  Result Date: 08/25/2015 CLINICAL DATA:  Chronic left knee pain without known injury. EXAM: LEFT KNEE - COMPLETE 4+ VIEW COMPARISON:  None. FINDINGS: There is no evidence of fracture, dislocation, or joint effusion. Mild narrowing of medial joint space is noted. Soft tissues are unremarkable. 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 No acute abnormality seen in the left knee. Electronically Signed   By: Marijo Conception, M.D.   On: 08/25/2015 14:38     Assessment & Plan:  Plan  I have discontinued Mr. Argent amoxicillin-clavulanate. I am also having him start on doxycycline, fluticasone, and levocetirizine. Additionally, I am having him maintain his sildenafil, allopurinol, hydrochlorothiazide, ezetimibe, and amLODipine-benazepril.  Meds ordered this encounter  Medications  . doxycycline (VIBRA-TABS) 100 MG tablet    Sig: Take 1 tablet (100 mg total) by mouth 2 (two) times daily.    Dispense:  20 tablet    Refill:  0  . fluticasone (FLONASE) 50 MCG/ACT nasal spray    Sig: Place 2 sprays into both nostrils daily.    Dispense:  16 g    Refill:  6  . levocetirizine (XYZAL) 5 MG tablet    Sig: Take 1 tablet (5 mg total) by mouth every evening.    Dispense:  30 tablet    Refill:  5    Problem List Items Addressed This Visit    None    Visit Diagnoses    Acute pansinusitis, recurrence not specified    -  Primary   Relevant Medications   doxycycline (VIBRA-TABS) 100 MG tablet   fluticasone (FLONASE) 50 MCG/ACT nasal spray   levocetirizine (XYZAL) 5 MG tablet      Follow-up: Return if symptoms worsen or fail to improve.  [name], DO

## 2016-11-08 ENCOUNTER — Ambulatory Visit: Payer: BC Managed Care – PPO | Admitting: Family Medicine

## 2016-11-11 ENCOUNTER — Other Ambulatory Visit: Payer: Self-pay | Admitting: Family Medicine

## 2016-11-11 DIAGNOSIS — I10 Essential (primary) hypertension: Secondary | ICD-10-CM

## 2016-12-19 ENCOUNTER — Ambulatory Visit: Payer: BC Managed Care – PPO | Admitting: Family Medicine

## 2017-05-19 ENCOUNTER — Other Ambulatory Visit: Payer: Self-pay | Admitting: Family Medicine

## 2017-05-19 DIAGNOSIS — J0140 Acute pansinusitis, unspecified: Secondary | ICD-10-CM

## 2017-05-25 ENCOUNTER — Other Ambulatory Visit: Payer: Self-pay | Admitting: Family Medicine

## 2017-05-25 DIAGNOSIS — I10 Essential (primary) hypertension: Secondary | ICD-10-CM

## 2017-08-07 ENCOUNTER — Ambulatory Visit: Payer: Self-pay | Admitting: Family Medicine

## 2017-08-07 DIAGNOSIS — Z0289 Encounter for other administrative examinations: Secondary | ICD-10-CM

## 2017-08-12 ENCOUNTER — Encounter: Payer: Self-pay | Admitting: Family Medicine

## 2017-08-12 ENCOUNTER — Ambulatory Visit (INDEPENDENT_AMBULATORY_CARE_PROVIDER_SITE_OTHER): Payer: BC Managed Care – PPO | Admitting: Family Medicine

## 2017-08-12 VITALS — BP 142/100 | HR 76 | Temp 97.7°F | Ht 69.0 in | Wt 254.0 lb

## 2017-08-12 DIAGNOSIS — E785 Hyperlipidemia, unspecified: Secondary | ICD-10-CM | POA: Diagnosis not present

## 2017-08-12 DIAGNOSIS — M109 Gout, unspecified: Secondary | ICD-10-CM | POA: Diagnosis not present

## 2017-08-12 DIAGNOSIS — Z0000 Encounter for general adult medical examination without abnormal findings: Secondary | ICD-10-CM

## 2017-08-12 DIAGNOSIS — I10 Essential (primary) hypertension: Secondary | ICD-10-CM | POA: Diagnosis not present

## 2017-08-12 HISTORY — DX: Hyperlipidemia, unspecified: E78.5

## 2017-08-12 MED ORDER — HYDROCHLOROTHIAZIDE 25 MG PO TABS: 25.0000 mg | ORAL_TABLET | Freq: Every day | ORAL | 1 refills | Status: DC

## 2017-08-12 NOTE — Assessment & Plan Note (Signed)
Encouraged heart healthy diet, increase exercise, avoid trans fats, consider a krill oil cap daily 

## 2017-08-12 NOTE — Progress Notes (Signed)
Patient ID: Alfred Martin, male    DOB: 1966-10-03  Age: 51 y.o. MRN: [id number]    Subjective:  Subjective  HPI Alfred Martin presents for f/u bp.    No complaints.     Review of Systems  Constitutional: Negative.   HENT: Negative for congestion, ear pain, hearing loss, nosebleeds, postnasal drip, rhinorrhea, sinus pressure, sneezing and tinnitus.   Eyes: Negative for photophobia, discharge, itching and visual disturbance.  Respiratory: Negative.   Cardiovascular: Negative.   Gastrointestinal: Negative for abdominal distention, abdominal pain, anal bleeding, blood in stool and constipation.  Endocrine: Negative.   Genitourinary: Negative.   Musculoskeletal: Negative.   Skin: Negative.   Allergic/Immunologic: Negative.   Neurological: Negative for dizziness, weakness, light-headedness, numbness and headaches.  Psychiatric/Behavioral: Negative for agitation, confusion, decreased concentration, dysphoric mood, sleep disturbance and suicidal ideas. The patient is not nervous/anxious.     History Past Medical History:  Diagnosis Date  . HTN (hypertension)     He has a past surgical history that includes Tonsillectomy.   His family history includes Arthritis in his maternal aunt, maternal aunt, maternal aunt, and mother; Breast cancer in his maternal aunt and paternal grandmother; Colon cancer in his paternal uncle; Congestive Heart Failure in his brother and paternal uncle; Diabetes in his brother, father, and mother; Drug abuse in his brother; HIV/AIDS in his brother; Heart disease in his maternal grandmother; High blood pressure in his father and mother; Hyperlipidemia in his father and mother; Hypertension in his brother, brother, father, and mother; Stroke in his paternal uncle.He reports that he has never smoked. He has never used smokeless tobacco. He reports that he does not drink alcohol or use drugs.  Current Outpatient Prescriptions on File Prior to Visit  Medication Sig Dispense  Refill  . allopurinol (ZYLOPRIM) 100 MG tablet Take 1 tablet (100 mg total) by mouth 2 (two) times daily. 180 tablet 1  . amLODipine-benazepril (LOTREL) 10-40 MG capsule TAKE 1 CAPSULE BY MOUTH DAILY. 90 capsule 1  . ezetimibe (ZETIA) 10 MG tablet Take 1 tablet (10 mg total) by mouth daily. 90 tablet 1  . fluticasone (FLONASE) 50 MCG/ACT nasal spray PLACE 2 SPRAYS INTO BOTH NOSTRILS DAILY. 16 g 6  . levocetirizine (XYZAL) 5 MG tablet Take 1 tablet (5 mg total) by mouth every evening. 30 tablet 5  . sildenafil (VIAGRA) 100 MG tablet Take 0.5-1 tablets (50-100 mg total) by mouth daily as needed for erectile dysfunction. (Patient not taking: Reported on 08/12/2017) 10 tablet 11   No current facility-administered medications on file prior to visit.      Objective:  Objective  Physical Exam  Constitutional: He is oriented to person, place, and time. Vital signs are normal. He appears well-developed and well-nourished. He is sleeping.  HENT:  Head: Normocephalic and atraumatic.  Mouth/Throat: Oropharynx is clear and moist.  Eyes: Pupils are equal, round, and reactive to light. EOM are normal.  Neck: Normal range of motion. Neck supple. No thyromegaly present.  Cardiovascular: Normal rate and regular rhythm.   No murmur heard. Pulmonary/Chest: Effort normal and breath sounds normal. No respiratory distress. He has no wheezes. He has no rales. He exhibits no tenderness.  Musculoskeletal: He exhibits no edema or tenderness.  Neurological: He is alert and oriented to person, place, and time.  Skin: Skin is warm and dry.  Psychiatric: He has a normal mood and affect. His behavior is normal. Judgment and thought content normal.  Nursing note and vitals reviewed.  BP (!) 142/100 (  BP Location: Right Arm, Patient Position: Sitting, Cuff Size: Large)   Pulse 76   Temp 97.7 F (36.5 C) (Oral)   Ht 5\' 9"  (1.753 m)   Wt 254 lb (115.2 kg)   SpO2 97%   BMI 37.51 kg/m  Wt Readings from Last 3  Encounters:  08/12/17 254 lb (115.2 kg)  09/30/16 242 lb 12.8 oz (110.1 kg)  07/25/16 247 lb (112 kg)     Lab Results  Component Value Date   WBC 5.2 08/25/2015   HGB 14.6 08/25/2015   HCT 44.7 08/25/2015   PLT 169.0 08/25/2015   GLUCOSE 106 (H) 06/14/2016   CHOL 206 (H) 06/14/2016   TRIG 102.0 06/14/2016   HDL 44.70 06/14/2016   LDLCALC 141 (H) 06/14/2016   ALT 69 (H) 06/14/2016   AST 44 (H) 06/14/2016   NA 138 06/14/2016   K 3.7 06/14/2016   CL 101 06/14/2016   CREATININE 1.31 06/14/2016   BUN 18 06/14/2016   CO2 30 06/14/2016   TSH 0.950 08/25/2015   PSA 1.06 06/01/2015    Dg Knee Complete 4 Views Left  Result Date: 08/25/2015 CLINICAL DATA:  Chronic left knee pain without known injury. EXAM: LEFT KNEE - COMPLETE 4+ VIEW COMPARISON:  None. FINDINGS: There is no evidence of fracture, dislocation, or joint effusion. Mild narrowing of medial joint space is noted. Soft tissues are unremarkable. 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 No acute abnormality seen in the left knee. Electronically Signed   By: Marijo Conception, M.D.   On: 08/25/2015 14:38     Assessment & Plan:  Plan  I have discontinued Alfred Martin doxycycline. I am also having him maintain his sildenafil, allopurinol, ezetimibe, levocetirizine, fluticasone, amLODipine-benazepril, and hydrochlorothiazide.  Meds ordered this encounter  Medications  . hydrochlorothiazide (HYDRODIURIL) 25 MG tablet    Sig: Take 1 tablet (25 mg total) by mouth daily. 1 po qd prn    Dispense:  90 tablet    Refill:  1    Problem List Items Addressed This Visit      Unprioritized   Preventative health care   Relevant Orders   Ambulatory referral to Gastroenterology   HTN (hypertension)    Poorly controlled will alter medications, encouraged DASH diet, minimize caffeine and obtain adequate sleep. Report concerning symptoms and follow up as directed and as needed      Relevant Medications    hydrochlorothiazide (HYDRODIURIL) 25 MG tablet   Hyperlipidemia LDL goal <100 - Primary    Encouraged heart healthy diet, increase exercise, avoid trans fats, consider a krill oil cap daily      Relevant Medications   hydrochlorothiazide (HYDRODIURIL) 25 MG tablet   Other Relevant Orders   Lipid panel   Comprehensive metabolic panel   Uric acid    Other Visit Diagnoses    Gout, unspecified cause, unspecified chronicity, unspecified site       Relevant Orders   Comprehensive metabolic panel   Uric acid      Follow-up: Return in about 3 weeks (around 09/02/2017), or bp check --- 3 m for ov.  [name], DO

## 2017-08-12 NOTE — Assessment & Plan Note (Signed)
Poorly controlled will alter medications, encouraged DASH diet, minimize caffeine and obtain adequate sleep. Report concerning symptoms and follow up as directed and as needed 

## 2017-08-12 NOTE — Patient Instructions (Signed)

## 2017-09-12 ENCOUNTER — Encounter: Payer: Self-pay | Admitting: Family Medicine

## 2017-09-12 ENCOUNTER — Ambulatory Visit (INDEPENDENT_AMBULATORY_CARE_PROVIDER_SITE_OTHER): Payer: BC Managed Care – PPO | Admitting: Family Medicine

## 2017-09-12 VITALS — BP 143/90 | HR 83

## 2017-09-12 DIAGNOSIS — I10 Essential (primary) hypertension: Secondary | ICD-10-CM | POA: Diagnosis not present

## 2017-09-12 MED ORDER — METOPROLOL SUCCINATE ER 25 MG PO TB24: 25.0000 mg | ORAL_TABLET | Freq: Every day | ORAL | 3 refills | Status: DC

## 2017-09-12 NOTE — Progress Notes (Signed)
Pre visit review using our clinic review tool, if applicable. No additional management support is needed unless otherwise documented below in the visit note.  Patient presents in office for a blood pressure check per OV note 08/12/17. He voiced adherence to medications & regimen. Patient is asymptomatic. Today's readings were as follow: BP 147/88 P 88 & BP 143/90 P 83.  Per Dr. Carollee Herter: START Metoprolol Succinate (Toprol-XL) 25 MG once daily. Continue all other medications & regimen. Return for nurse visit in 2-3 weeks for blood pressure check.   Informed patient of the provider's recommendations. He verbalized understanding. Next appointment 09/26/17 at 4:00 PM.

## 2017-09-12 NOTE — Patient Instructions (Addendum)
Per Dr. Carollee Herter: START Metoprolol Succinate (Toprol-XL) 25 MG once daily. Continue all other medications & regimen. Return for nurse visit in 2-3 weeks for blood pressure check.

## 2017-09-26 ENCOUNTER — Ambulatory Visit (INDEPENDENT_AMBULATORY_CARE_PROVIDER_SITE_OTHER): Payer: BC Managed Care – PPO | Admitting: Family Medicine

## 2017-09-26 ENCOUNTER — Encounter: Payer: Self-pay | Admitting: Family Medicine

## 2017-09-26 VITALS — BP 120/86 | HR 70

## 2017-09-26 DIAGNOSIS — I10 Essential (primary) hypertension: Secondary | ICD-10-CM

## 2017-09-26 NOTE — Progress Notes (Signed)
Pre visit review using our clinic review tool, if applicable. No additional management support is needed unless otherwise documented below in the visit note.  Patient came in clinic today for blood pressure check per OV note 09/12/17. He verbalized compliance with all medications. Patient denies headaches, dizziness & lightheadedness. RN obtained the following readings: BP 120/86 P 70 & 02 98%.  Per Dr. Carollee Herter: Continue current medications & regimen. Keep office visit appointment with PCP on 11/11/17 at 5:30 PM.  Patient was made aware of the provider's recommendations & voiced understanding. He did not have any further questions or concerns before leaving the nurse visit. [name], DO

## 2017-09-26 NOTE — Patient Instructions (Addendum)
Per Dr. Carollee Herter: Continue current medications & regimen. Keep office visit appointment with PCP on 11/11/17 at 5:30 PM.

## 2017-10-02 NOTE — Progress Notes (Signed)
Yvonne R Lowne Chase, DO 

## 2017-10-15 ENCOUNTER — Other Ambulatory Visit: Payer: Self-pay | Admitting: Family Medicine

## 2017-10-15 DIAGNOSIS — J0140 Acute pansinusitis, unspecified: Secondary | ICD-10-CM

## 2017-11-11 ENCOUNTER — Ambulatory Visit: Payer: BC Managed Care – PPO | Admitting: Family Medicine

## 2017-11-21 ENCOUNTER — Ambulatory Visit: Payer: BC Managed Care – PPO | Admitting: Family Medicine

## 2017-11-21 ENCOUNTER — Encounter: Payer: Self-pay | Admitting: Family Medicine

## 2017-11-21 VITALS — BP 130/82 | HR 81 | Temp 98.3°F | Resp 16 | Ht 69.0 in | Wt 248.2 lb

## 2017-11-21 DIAGNOSIS — M109 Gout, unspecified: Secondary | ICD-10-CM | POA: Diagnosis not present

## 2017-11-21 DIAGNOSIS — E785 Hyperlipidemia, unspecified: Secondary | ICD-10-CM | POA: Diagnosis not present

## 2017-11-21 DIAGNOSIS — I10 Essential (primary) hypertension: Secondary | ICD-10-CM

## 2017-11-21 HISTORY — DX: Gout, unspecified: M10.9

## 2017-11-21 LAB — COMPREHENSIVE METABOLIC PANEL
AG RATIO: 1.6 (calc) (ref 1.0–2.5)
ALBUMIN MSPROF: 4.2 g/dL (ref 3.6–5.1)
ALKALINE PHOSPHATASE (APISO): 34 U/L — AB (ref 40–115)
ALT: 64 U/L — ABNORMAL HIGH (ref 9–46)
AST: 39 U/L — AB (ref 10–35)
BILIRUBIN TOTAL: 0.4 mg/dL (ref 0.2–1.2)
BUN/Creatinine Ratio: 13 (calc) (ref 6–22)
BUN: 19 mg/dL (ref 7–25)
CALCIUM: 9.9 mg/dL (ref 8.6–10.3)
CHLORIDE: 102 mmol/L (ref 98–110)
CO2: 30 mmol/L (ref 20–32)
Creat: 1.48 mg/dL — ABNORMAL HIGH (ref 0.70–1.33)
GLOBULIN: 2.7 g/dL (ref 1.9–3.7)
Glucose, Bld: 106 mg/dL — ABNORMAL HIGH (ref 65–99)
POTASSIUM: 3.8 mmol/L (ref 3.5–5.3)
Sodium: 138 mmol/L (ref 135–146)
Total Protein: 6.9 g/dL (ref 6.1–8.1)

## 2017-11-21 LAB — LIPID PANEL
CHOLESTEROL: 171 mg/dL (ref <200)
HDL: 40 mg/dL — AB (ref >40)
LDL Cholesterol (Calc): 99 mg/dL (calc)
NON-HDL CHOLESTEROL (CALC): 131 mg/dL — AB (ref <130)
TRIGLYCERIDES: 205 mg/dL — AB (ref <150)
Total CHOL/HDL Ratio: 4.3 (calc) (ref <5.0)

## 2017-11-21 LAB — URIC ACID: URIC ACID, SERUM: 8.7 mg/dL — AB (ref 4.0–8.0)

## 2017-11-21 NOTE — Assessment & Plan Note (Signed)
Check labs  con't allopurinol 

## 2017-11-21 NOTE — Progress Notes (Signed)
Patient ID: Alfred Martin, male   DOB: 1966-10-17, 51 y.o.   MRN: [id number]     Subjective:  I acted as a Education administrator for Dr. Carollee Herter.  [name], [name]   Patient ID: Alfred Martin, male    DOB: 1966-11-21, 51 y.o.   MRN: [id number]  Chief Complaint  Patient presents with  . Hypertension  . Hyperlipidemia  . Gout    HPI  Patient is in today for follow up blood pressure, cholesterol and gout. No complaints.    Patient Care Team: Carollee Herter, [name], DO as PCP - General (Family Medicine) Despina Hick, MD (Dermatology)   Past Medical History:  Diagnosis Date  . HTN (hypertension)     Past Surgical History:  Procedure Laterality Date  . TONSILLECTOMY      Family History  Problem Relation Age of Onset  . Arthritis Mother        Severe-- RA  . Diabetes Mother   . Hyperlipidemia Mother   . High blood pressure Mother   . Hypertension Mother   . Colon cancer Paternal Uncle   . Congestive Heart Failure Paternal Uncle   . Stroke Paternal Uncle   . Heart disease Maternal Grandmother   . Breast cancer Paternal Grandmother   . Diabetes Father   . Hyperlipidemia Father   . High blood pressure Father   . Hypertension Father   . Diabetes Brother   . Hypertension Brother   . HIV/AIDS Brother   . Breast cancer Maternal Aunt   . Arthritis Maternal Aunt        RA  . Hypertension Brother   . Drug abuse Brother        chf-- crack / cocaine  . Congestive Heart Failure Brother   . Arthritis Maternal Aunt   . Arthritis Maternal Aunt     Social History   Socioeconomic History  . Marital status: Married    Spouse name: Not on file  . Number of children: Not on file  . Years of education: Not on file  . Highest education level: Not on file  Social Needs  . Financial resource strain: Not on file  . Food insecurity - worry: Not on file  . Food insecurity - inability: Not on file  . Transportation needs - medical: Not on file  . Transportation needs - non-medical: Not on file    Occupational History    Employer: [employer]    Comment: Psychologist, prison and probation services  Tobacco Use  . Smoking status: Never Smoker  . Smokeless tobacco: Never Used  Substance and Sexual Activity  . Alcohol use: No    Alcohol/week: 0.0 oz  . Drug use: No  . Sexual activity: Yes  Other Topics Concern  . 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 allopurinol (ZYLOPRIM) 100 MG tablet Take 1 tablet (100 mg total) by mouth 2 (two) times daily. 180 tablet 1  . amLODipine-benazepril (LOTREL) 10-40 MG capsule TAKE 1 CAPSULE BY MOUTH DAILY. 90 capsule 1  . ezetimibe (ZETIA) 10 MG tablet Take 1 tablet (10 mg total) by mouth daily. 90 tablet 1  . fluticasone (FLONASE) 50 MCG/ACT nasal spray PLACE 2 SPRAYS INTO BOTH NOSTRILS DAILY. 16 g 6  . hydrochlorothiazide (HYDRODIURIL) 25 MG tablet Take 1 tablet (25 mg total) by mouth daily. 1 po qd prn  90 tablet 1  . levocetirizine (XYZAL) 5 MG tablet TAKE 1 TABLET (5 MG TOTAL) BY MOUTH EVERY EVENING. 30 tablet 2  . metoprolol succinate (TOPROL-XL) 25 MG 24 hr tablet Take 1 tablet (25 mg total) by mouth daily. 30 tablet 3  . sildenafil (VIAGRA) 100 MG tablet Take 0.5-1 tablets (50-100 mg total) by mouth daily as needed for erectile dysfunction. 10 tablet 11   No facility-administered medications prior to visit.     No Known Allergies  Review of Systems  Constitutional: Negative for fever and malaise/fatigue.  HENT: Negative for congestion.   Eyes: Negative for blurred vision.  Respiratory: Negative for cough and shortness of breath.   Cardiovascular: Negative for chest pain, palpitations and leg swelling.  Gastrointestinal: Negative for vomiting.  Musculoskeletal: Negative for back pain.  Skin: Negative for rash.  Neurological: Negative for loss of consciousness and headaches.       Objective:    Physical  Exam  Constitutional: He is oriented to person, place, and time. Vital signs are normal. He appears well-developed and well-nourished. He is sleeping.  HENT:  Head: Normocephalic and atraumatic.  Mouth/Throat: Oropharynx is clear and moist.  Eyes: EOM are normal. Pupils are equal, round, and reactive to light.  Neck: Normal range of motion. Neck supple. No thyromegaly present.  Cardiovascular: Normal rate and regular rhythm.  No murmur heard. Pulmonary/Chest: Effort normal and breath sounds normal. No respiratory distress. He has no wheezes. He has no rales. He exhibits no tenderness.  Musculoskeletal: He exhibits no edema or tenderness.  Neurological: He is alert and oriented to person, place, and time.  Skin: Skin is warm and dry.  Psychiatric: He has a normal mood and affect. His behavior is normal. Judgment and thought content normal.  Nursing note and vitals reviewed.   BP 130/82   Pulse 81   Temp 98.3 F (36.8 C) (Oral)   Resp 16   Ht 5\' 9"  (1.753 m)   Wt 248 lb 3.2 oz (112.6 kg)   SpO2 98%   BMI 36.65 kg/m  Wt Readings from Last 3 Encounters:  11/21/17 248 lb 3.2 oz (112.6 kg)  08/12/17 254 lb (115.2 kg)  09/30/16 242 lb 12.8 oz (110.1 kg)   BP Readings from Last 3 Encounters:  11/21/17 130/82  09/26/17 120/86  09/12/17 (!) 143/90     Immunization History  Administered Date(s) Administered  . Tdap 06/01/2015    Health Maintenance  Topic Date Due  . COLONOSCOPY  01/22/2015  . INFLUENZA VACCINE  07/02/2017  . TETANUS/TDAP  05/31/2025  . HIV Screening  Completed    Lab Results  Component Value Date   WBC 5.2 08/25/2015   HGB 14.6 08/25/2015   HCT 44.7 08/25/2015   PLT 169.0 08/25/2015   GLUCOSE 106 (H) 06/14/2016   CHOL 206 (H) 06/14/2016   TRIG 102.0 06/14/2016   HDL 44.70 06/14/2016   LDLCALC 141 (H) 06/14/2016   ALT 69 (H) 06/14/2016   AST 44 (H) 06/14/2016   NA 138 06/14/2016   K 3.7 06/14/2016   CL 101 06/14/2016   CREATININE 1.31 06/14/2016     BUN 18 06/14/2016   CO2 30 06/14/2016   TSH 0.950 08/25/2015   PSA 1.06 06/01/2015    Lab Results  Component Value Date   TSH 0.950 08/25/2015   Lab Results  Component Value Date   WBC 5.2 08/25/2015   HGB 14.6 08/25/2015   HCT 44.7 08/25/2015   MCV 81.9 08/25/2015  PLT 169.0 08/25/2015   Lab Results  Component Value Date   NA 138 06/14/2016   K 3.7 06/14/2016   CO2 30 06/14/2016   GLUCOSE 106 (H) 06/14/2016   BUN 18 06/14/2016   CREATININE 1.31 06/14/2016   BILITOT 0.7 06/14/2016   ALKPHOS 40 06/14/2016   AST 44 (H) 06/14/2016   ALT 69 (H) 06/14/2016   PROT 7.6 06/14/2016   ALBUMIN 4.3 06/14/2016   CALCIUM 9.7 06/14/2016   GFR 74.07 06/14/2016   Lab Results  Component Value Date   CHOL 206 (H) 06/14/2016   Lab Results  Component Value Date   HDL 44.70 06/14/2016   Lab Results  Component Value Date   LDLCALC 141 (H) 06/14/2016   Lab Results  Component Value Date   TRIG 102.0 06/14/2016   Lab Results  Component Value Date   CHOLHDL 5 06/14/2016   No results found for: HGBA1C       Assessment & Plan:   Problem List Items Addressed This Visit      Unprioritized   Gout of multiple sites - Primary    Check labs con't allopurinol      Relevant Orders   Uric acid   Lipid panel   Comprehensive metabolic panel   HTN (hypertension)    Well controlled, no changes to meds. Encouraged heart healthy diet such as the DASH diet and exercise as tolerated.        Relevant Orders   Uric acid   Lipid panel   Comprehensive metabolic panel   Hyperlipidemia LDL goal <100    Tolerating statin, encouraged heart healthy diet, avoid trans fats, minimize simple carbs and saturated fats. Increase exercise as tolerated      Relevant Orders   Uric acid   Lipid panel   Comprehensive metabolic panel      I am having Alfred Martin maintain his sildenafil, allopurinol, ezetimibe, fluticasone, amLODipine-benazepril, hydrochlorothiazide, metoprolol succinate,  and levocetirizine.  No orders of the defined types were placed in this encounter.   CMA served as Education administrator during this visit. History, Physical and Plan performed by medical provider. Documentation and orders reviewed and attested to.  [name], DO

## 2017-11-21 NOTE — Assessment & Plan Note (Signed)
Tolerating statin, encouraged heart healthy diet, avoid trans fats, minimize simple carbs and saturated fats. Increase exercise as tolerated 

## 2017-11-21 NOTE — Patient Instructions (Signed)

## 2017-11-21 NOTE — Assessment & Plan Note (Signed)
Well controlled, no changes to meds. Encouraged heart healthy diet such as the DASH diet and exercise as tolerated.  °

## 2017-11-22 ENCOUNTER — Other Ambulatory Visit: Payer: Self-pay | Admitting: Family Medicine

## 2017-11-22 DIAGNOSIS — J0140 Acute pansinusitis, unspecified: Secondary | ICD-10-CM

## 2017-11-26 ENCOUNTER — Other Ambulatory Visit: Payer: Self-pay

## 2017-11-26 DIAGNOSIS — E785 Hyperlipidemia, unspecified: Secondary | ICD-10-CM

## 2017-12-11 ENCOUNTER — Other Ambulatory Visit: Payer: Self-pay | Admitting: Family Medicine

## 2017-12-16 ENCOUNTER — Other Ambulatory Visit: Payer: Self-pay | Admitting: Family Medicine

## 2017-12-16 DIAGNOSIS — I10 Essential (primary) hypertension: Secondary | ICD-10-CM

## 2018-01-15 ENCOUNTER — Other Ambulatory Visit: Payer: Self-pay

## 2018-01-15 MED ORDER — METOPROLOL SUCCINATE ER 25 MG PO TB24: 25.0000 mg | ORAL_TABLET | Freq: Every day | ORAL | 3 refills | Status: DC

## 2018-01-16 ENCOUNTER — Telehealth: Payer: Self-pay | Admitting: Family Medicine

## 2018-01-16 DIAGNOSIS — J0140 Acute pansinusitis, unspecified: Secondary | ICD-10-CM

## 2018-01-16 MED ORDER — LEVOCETIRIZINE DIHYDROCHLORIDE 5 MG PO TABS: 5.0000 mg | ORAL_TABLET | Freq: Every evening | ORAL | 1 refills | Status: DC

## 2018-01-16 MED ORDER — FLUTICASONE PROPIONATE 50 MCG/ACT NA SUSP: NASAL | 1 refills | Status: DC

## 2018-01-16 NOTE — Telephone Encounter (Addendum)
Copied from [place] [id number]. Topic: Quick Communication - See Telephone Encounter >> Jan 16, 2018  9:22 AM Oneta Rack wrote:   Pharmacy: [id number] - [place], [address] AT [address] [phone] (Phone) [phone] (Fax)   Reason for call:  Pharmacy requesting 90 day supply of fluticasone (FLONASE) 50 MCG/ACT nasal spray and levocetirizine (XYZAL) 5 MG tablet, please advise

## 2018-01-16 NOTE — Telephone Encounter (Signed)
rxs sent in and patient notified.

## 2018-01-21 ENCOUNTER — Other Ambulatory Visit: Payer: Self-pay | Admitting: *Deleted

## 2018-01-21 MED ORDER — METOPROLOL SUCCINATE ER 25 MG PO TB24: 25.0000 mg | ORAL_TABLET | Freq: Every day | ORAL | 1 refills | Status: DC

## 2018-02-06 ENCOUNTER — Other Ambulatory Visit: Payer: Self-pay | Admitting: Family Medicine

## 2018-02-06 DIAGNOSIS — I10 Essential (primary) hypertension: Secondary | ICD-10-CM

## 2018-02-27 ENCOUNTER — Other Ambulatory Visit: Payer: BC Managed Care – PPO

## 2018-03-04 ENCOUNTER — Other Ambulatory Visit: Payer: Self-pay | Admitting: Family Medicine

## 2018-03-23 ENCOUNTER — Other Ambulatory Visit: Payer: BC Managed Care – PPO

## 2018-05-22 ENCOUNTER — Ambulatory Visit (INDEPENDENT_AMBULATORY_CARE_PROVIDER_SITE_OTHER): Payer: BC Managed Care – PPO | Admitting: Family Medicine

## 2018-05-22 ENCOUNTER — Encounter: Payer: Self-pay | Admitting: Family Medicine

## 2018-05-22 DIAGNOSIS — Z0000 Encounter for general adult medical examination without abnormal findings: Secondary | ICD-10-CM | POA: Diagnosis not present

## 2018-05-22 DIAGNOSIS — I10 Essential (primary) hypertension: Secondary | ICD-10-CM

## 2018-05-22 DIAGNOSIS — M109 Gout, unspecified: Secondary | ICD-10-CM

## 2018-05-22 DIAGNOSIS — E785 Hyperlipidemia, unspecified: Secondary | ICD-10-CM | POA: Diagnosis not present

## 2018-05-22 NOTE — Progress Notes (Signed)
Subjective:  I acted as a Education administrator for Dr. Charlett Blake. [place], [place]  Patient ID: Alfred Martin, male    DOB: 03-30-1966, 52 y.o.   MRN: [id number]  No chief complaint on file.   HPI  Patient is in today for an annual exam. He has no acute concerns.   Patient Care Team: Carollee Herter, [name], DO as PCP - General (Family Medicine) Despina Hick, MD (Dermatology)   Past Medical History:  Diagnosis Date  . HTN (hypertension)     Past Surgical History:  Procedure Laterality Date  . TONSILLECTOMY      Family History  Problem Relation Age of Onset  . Arthritis Mother        Severe-- RA  . Diabetes Mother   . Hyperlipidemia Mother   . High blood pressure Mother   . Hypertension Mother   . Colon cancer Paternal Uncle   . Congestive Heart Failure Paternal Uncle   . Stroke Paternal Uncle   . Diabetes Father   . Hyperlipidemia Father   . High blood pressure Father   . Hypertension Father   . Diabetes Brother   . Hypertension Brother   . HIV/AIDS Brother   . Breast cancer Maternal Aunt   . Arthritis Maternal Aunt        RA  . Hypertension Brother   . Drug abuse Brother        chf-- crack / cocaine  . Congestive Heart Failure Brother   . Arthritis Maternal Aunt   . Arthritis Maternal Aunt   . Heart disease Maternal Grandmother   . Breast cancer Paternal Grandmother     Social History   Socioeconomic History  . Marital status: Married    Spouse name: Not on file  . Number of children: Not on file  . Years of education: Not on file  . 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 Financial resource strain: Not on file  . Food insecurity:    Worry: Not on file    Inability: Not on file  . Transportation needs:    Medical: Not on file    Non-medical: Not on file  Tobacco Use  . Smoking status: Never Smoker  . Smokeless tobacco: Never Used  Substance and Sexual Activity  .  Alcohol use: No    Alcohol/week: 0.0 oz  . Drug use: No  . Sexual activity: Yes  Lifestyle  . Physical activity:    Days per week: Not on file    Minutes per session: Not on file  . Stress: Not on file  Relationships  . Social connections:    Talks on phone: Not on file    Gets together: Not on file    Attends religious service: Not on file    Active member of club or organization: Not on file    Attends meetings of clubs or organizations: Not on file    Relationship status: Not on file  . Intimate partner violence:    Fear of current or ex partner: Not on file    Emotionally abused: Not on file    Physically abused: Not on file    Forced sexual activity: Not on file  Other Topics Concern  . Not on file  Social History Narrative   Exercise-- jogging 3-4 days a week   [employer]es [employer]-- [employer]    Outpatient Medications Prior  to Visit  Medication Sig Dispense Refill  . allopurinol (ZYLOPRIM) 100 MG tablet TAKE 1 TABLET BY MOUTH TWICE A DAY 180 tablet 0  . amLODipine-benazepril (LOTREL) 10-40 MG capsule TAKE 1 CAPSULE BY MOUTH EVERY DAY 90 capsule 1  . ezetimibe (ZETIA) 10 MG tablet Take 1 tablet (10 mg total) by mouth daily. 90 tablet 1  . fluticasone (FLONASE) 50 MCG/ACT nasal spray SPRAY 2 SPRAYS INTO EACH NOSTRIL EVERY DAY 48 g 1  . hydrochlorothiazide (HYDRODIURIL) 25 MG tablet TAKE 1 TABLET BY MOUTH EVERY DAY 90 tablet 1  . levocetirizine (XYZAL) 5 MG tablet Take 1 tablet (5 mg total) by mouth every evening. 90 tablet 1  . metoprolol succinate (TOPROL-XL) 25 MG 24 hr tablet Take 1 tablet (25 mg total) by mouth daily. 90 tablet 1  . sildenafil (VIAGRA) 100 MG tablet Take 0.5-1 tablets (50-100 mg total) by mouth daily as needed for erectile dysfunction. 10 tablet 11   No facility-administered medications prior to visit.     No Known Allergies  Review of Systems  Constitutional: Negative for fever and malaise/fatigue.  HENT: Negative for congestion.   Eyes: Negative  for blurred vision.  Respiratory: Negative for cough and shortness of breath.   Cardiovascular: Negative for chest pain, palpitations and leg swelling.  Gastrointestinal: Negative for vomiting.  Musculoskeletal: Negative for back pain.  Skin: Negative for rash.  Neurological: Negative for loss of consciousness and headaches.       Objective:    Physical Exam  BP (!) 151/88 (BP Location: Left Arm, Patient Position: Sitting, Cuff Size: Large)   Pulse 81   Temp 98.5 F (36.9 C) (Oral)   Resp 18   Ht 5\' 9"  (1.753 m)   Wt 250 lb 12.8 oz (113.8 kg)   SpO2 100%   BMI 37.04 kg/m  Wt Readings from Last 3 Encounters:  05/22/18 250 lb 12.8 oz (113.8 kg)  11/21/17 248 lb 3.2 oz (112.6 kg)  08/12/17 254 lb (115.2 kg)   BP Readings from Last 3 Encounters:  05/22/18 (!) 151/88  11/21/17 130/82  09/26/17 120/86     Immunization History  Administered Date(s) Administered  . Tdap 06/01/2015    Health Maintenance  Topic Date Due  . COLONOSCOPY  01/22/2015  . INFLUENZA VACCINE  07/02/2018  . TETANUS/TDAP  05/31/2025  . HIV Screening  Completed    Lab Results  Component Value Date   WBC 6.6 05/22/2018   HGB 14.2 05/22/2018   HCT 42.8 05/22/2018   PLT 174 05/22/2018   GLUCOSE 79 05/22/2018   CHOL 208 (H) 05/22/2018   TRIG 255 (H) 05/22/2018   HDL 41 05/22/2018   LDLCALC 128 (H) 05/22/2018   ALT 79 (H) 05/22/2018   AST 49 (H) 05/22/2018   NA 140 05/22/2018   K 3.9 05/22/2018   CL 102 05/22/2018   CREATININE 1.41 (H) 05/22/2018   BUN 18 05/22/2018   CO2 29 05/22/2018   TSH 2.80 05/22/2018   PSA 0.9 05/22/2018   MICROALBUR 1.3 05/22/2018    Lab Results  Component Value Date   TSH 2.80 05/22/2018   Lab Results  Component Value Date   WBC 6.6 05/22/2018   HGB 14.2 05/22/2018   HCT 42.8 05/22/2018   MCV 80.3 05/22/2018   PLT 174 05/22/2018   Lab Results  Component Value Date   NA 140 05/22/2018   K 3.9 05/22/2018   CO2 29 05/22/2018   GLUCOSE 79 05/22/2018  BUN 18 05/22/2018   CREATININE 1.41 (H) 05/22/2018   BILITOT 0.5 05/22/2018   ALKPHOS 40 06/14/2016   AST 49 (H) 05/22/2018   ALT 79 (H) 05/22/2018   PROT 7.1 05/22/2018   ALBUMIN 4.3 06/14/2016   CALCIUM 9.7 05/22/2018   GFR 74.07 06/14/2016   Lab Results  Component Value Date   CHOL 208 (H) 05/22/2018   Lab Results  Component Value Date   HDL 41 05/22/2018   Lab Results  Component Value Date   LDLCALC 128 (H) 05/22/2018   Lab Results  Component Value Date   TRIG 255 (H) 05/22/2018   Lab Results  Component Value Date   CHOLHDL 5.1 (H) 05/22/2018   No results found for: HGBA1C       Assessment & Plan:   Problem List Items Addressed This Visit      Unprioritized   Gout of multiple sites    Check labs       Relevant Orders   Comprehensive metabolic panel (Completed)   Uric acid (Completed)   HTN (hypertension)    Well controlled, no changes to meds. Encouraged heart healthy diet such as the DASH diet and exercise as tolerated.       Relevant Orders   Lipid panel (Completed)   CBC with Differential/Platelet (Completed)   Comprehensive metabolic panel (Completed)   Hyperlipidemia LDL goal <100    Tolerating statin, encouraged heart healthy diet, avoid trans fats, minimize simple carbs and saturated fats. Increase exercise as tolerated      Morbid (severe) obesity due to excess calories ([place]) - Primary    Healthy weight and wellness      Relevant Orders   Amb Ref to Medical Weight Management   Preventative health care    ghm utd Check labs  See avs       Relevant Orders   TSH (Completed)   PSA (Completed)   Microalbumin / creatinine urine ratio (Completed)   Lipid panel (Completed)   CBC with Differential/Platelet (Completed)   Comprehensive metabolic panel (Completed)   Uric acid (Completed)   Ambulatory referral to Gastroenterology      I am having Jacquelyne Balint maintain his sildenafil, ezetimibe, amLODipine-benazepril, levocetirizine,  fluticasone, metoprolol succinate, hydrochlorothiazide, and allopurinol.  No orders of the defined types were placed in this encounter.   CMA served as Education administrator during this visit. History, Physical and Plan performed by medical provider. Documentation and orders reviewed and attested to.  [name], DO

## 2018-05-22 NOTE — Patient Instructions (Signed)

## 2018-05-23 DIAGNOSIS — Z6835 Body mass index (BMI) 35.0-35.9, adult: Secondary | ICD-10-CM

## 2018-05-23 HISTORY — DX: Morbid (severe) obesity due to excess calories: E66.01

## 2018-05-23 HISTORY — DX: Body mass index (BMI) 35.0-35.9, adult: Z68.35

## 2018-05-23 LAB — CBC WITH DIFFERENTIAL/PLATELET
Basophils Absolute: 99 cells/uL (ref 0–200)
Basophils Relative: 1.5 %
EOS PCT: 2 %
Eosinophils Absolute: 132 cells/uL (ref 15–500)
HEMATOCRIT: 42.8 % (ref 38.5–50.0)
HEMOGLOBIN: 14.2 g/dL (ref 13.2–17.1)
LYMPHS ABS: 2957 {cells}/uL (ref 850–3900)
MCH: 26.6 pg — ABNORMAL LOW (ref 27.0–33.0)
MCHC: 33.2 g/dL (ref 32.0–36.0)
MCV: 80.3 fL (ref 80.0–100.0)
MPV: 12.7 fL — ABNORMAL HIGH (ref 7.5–12.5)
Monocytes Relative: 11.7 %
NEUTROS ABS: 2640 {cells}/uL (ref 1500–7800)
Neutrophils Relative %: 40 %
Platelets: 174 10*3/uL (ref 140–400)
RBC: 5.33 10*6/uL (ref 4.20–5.80)
RDW: 13.5 % (ref 11.0–15.0)
Total Lymphocyte: 44.8 %
WBC mixed population: 772 cells/uL (ref 200–950)
WBC: 6.6 10*3/uL (ref 3.8–10.8)

## 2018-05-23 LAB — COMPREHENSIVE METABOLIC PANEL
AG RATIO: 1.4 (calc) (ref 1.0–2.5)
ALT: 79 U/L — AB (ref 9–46)
AST: 49 U/L — ABNORMAL HIGH (ref 10–35)
Albumin: 4.2 g/dL (ref 3.6–5.1)
Alkaline phosphatase (APISO): 33 U/L — ABNORMAL LOW (ref 40–115)
BILIRUBIN TOTAL: 0.5 mg/dL (ref 0.2–1.2)
BUN/Creatinine Ratio: 13 (calc) (ref 6–22)
BUN: 18 mg/dL (ref 7–25)
CALCIUM: 9.7 mg/dL (ref 8.6–10.3)
CO2: 29 mmol/L (ref 20–32)
Chloride: 102 mmol/L (ref 98–110)
Creat: 1.41 mg/dL — ABNORMAL HIGH (ref 0.70–1.33)
Globulin: 2.9 g/dL (calc) (ref 1.9–3.7)
Glucose, Bld: 79 mg/dL (ref 65–99)
Potassium: 3.9 mmol/L (ref 3.5–5.3)
SODIUM: 140 mmol/L (ref 135–146)
TOTAL PROTEIN: 7.1 g/dL (ref 6.1–8.1)

## 2018-05-23 LAB — PSA: PSA: 0.9 ng/mL (ref <=4.0)

## 2018-05-23 LAB — MICROALBUMIN / CREATININE URINE RATIO
Creatinine, Urine: 200 mg/dL (ref 20–320)
MICROALB/CREAT RATIO: 7 ug/mg{creat} (ref <30)
Microalb, Ur: 1.3 mg/dL

## 2018-05-23 LAB — URIC ACID: Uric Acid, Serum: 7.9 mg/dL (ref 4.0–8.0)

## 2018-05-23 LAB — LIPID PANEL
CHOLESTEROL: 208 mg/dL — AB (ref <200)
HDL: 41 mg/dL (ref >40)
LDL CHOLESTEROL (CALC): 128 mg/dL — AB
Non-HDL Cholesterol (Calc): 167 mg/dL (calc) — ABNORMAL HIGH (ref <130)
Total CHOL/HDL Ratio: 5.1 (calc) — ABNORMAL HIGH (ref <5.0)
Triglycerides: 255 mg/dL — ABNORMAL HIGH (ref <150)

## 2018-05-23 LAB — TSH: TSH: 2.8 mIU/L (ref 0.40–4.50)

## 2018-05-23 NOTE — Assessment & Plan Note (Signed)
Tolerating statin, encouraged heart healthy diet, avoid trans fats, minimize simple carbs and saturated fats. Increase exercise as tolerated 

## 2018-05-23 NOTE — Assessment & Plan Note (Signed)
ghm utd Check labs  See avs  

## 2018-05-23 NOTE — Assessment & Plan Note (Signed)
Well controlled, no changes to meds. Encouraged heart healthy diet such as the DASH diet and exercise as tolerated.  °

## 2018-05-23 NOTE — Assessment & Plan Note (Signed)
Check labs 

## 2018-05-23 NOTE — Assessment & Plan Note (Signed)
Healthy weight and wellness  

## 2018-05-25 ENCOUNTER — Encounter: Payer: Self-pay | Admitting: Gastroenterology

## 2018-05-25 ENCOUNTER — Encounter: Payer: Self-pay | Admitting: Internal Medicine

## 2018-05-29 MED ORDER — FENOFIBRATE 160 MG PO TABS: 160.0000 mg | ORAL_TABLET | Freq: Every day | ORAL | 2 refills | Status: DC

## 2018-05-29 MED ORDER — ALLOPURINOL 300 MG PO TABS: 300.0000 mg | ORAL_TABLET | Freq: Every day | ORAL | 2 refills | Status: DC

## 2018-05-29 NOTE — Addendum Note (Signed)
Addended by: Wynonia Musty A on: 05/29/2018 09:01 AM   Modules accepted: Orders

## 2018-06-14 ENCOUNTER — Other Ambulatory Visit: Payer: Self-pay | Admitting: Family Medicine

## 2018-06-14 DIAGNOSIS — I10 Essential (primary) hypertension: Secondary | ICD-10-CM

## 2018-06-15 ENCOUNTER — Other Ambulatory Visit: Payer: Self-pay | Admitting: Family Medicine

## 2018-07-06 ENCOUNTER — Other Ambulatory Visit: Payer: Self-pay | Admitting: Family Medicine

## 2018-07-06 DIAGNOSIS — J0140 Acute pansinusitis, unspecified: Secondary | ICD-10-CM

## 2018-07-09 ENCOUNTER — Other Ambulatory Visit: Payer: Self-pay | Admitting: Family Medicine

## 2018-07-09 DIAGNOSIS — J0140 Acute pansinusitis, unspecified: Secondary | ICD-10-CM

## 2018-07-11 ENCOUNTER — Emergency Department (HOSPITAL_BASED_OUTPATIENT_CLINIC_OR_DEPARTMENT_OTHER): Payer: BC Managed Care – PPO

## 2018-07-11 ENCOUNTER — Emergency Department (HOSPITAL_BASED_OUTPATIENT_CLINIC_OR_DEPARTMENT_OTHER)
Admission: EM | Admit: 2018-07-11 | Discharge: 2018-07-11 | Disposition: A | Discharge status: Home / Self Care | Payer: BC Managed Care – PPO | Attending: Emergency Medicine | Admitting: Emergency Medicine

## 2018-07-11 ENCOUNTER — Other Ambulatory Visit: Payer: Self-pay

## 2018-07-11 ENCOUNTER — Encounter (HOSPITAL_BASED_OUTPATIENT_CLINIC_OR_DEPARTMENT_OTHER): Payer: Self-pay | Admitting: Emergency Medicine

## 2018-07-11 DIAGNOSIS — M7022 Olecranon bursitis, left elbow: Secondary | ICD-10-CM | POA: Diagnosis not present

## 2018-07-11 DIAGNOSIS — Y9389 Activity, other specified: Secondary | ICD-10-CM | POA: Diagnosis not present

## 2018-07-11 DIAGNOSIS — I10 Essential (primary) hypertension: Secondary | ICD-10-CM | POA: Diagnosis not present

## 2018-07-11 DIAGNOSIS — M109 Gout, unspecified: Secondary | ICD-10-CM | POA: Diagnosis not present

## 2018-07-11 DIAGNOSIS — Z79899 Other long term (current) drug therapy: Secondary | ICD-10-CM | POA: Diagnosis not present

## 2018-07-11 DIAGNOSIS — M7150 Other bursitis, not elsewhere classified, unspecified site: Secondary | ICD-10-CM

## 2018-07-11 DIAGNOSIS — M25522 Pain in left elbow: Secondary | ICD-10-CM | POA: Diagnosis present

## 2018-07-11 HISTORY — DX: Gout, unspecified: M10.9

## 2018-07-11 MED ORDER — LIDOCAINE-EPINEPHRINE (PF) 2 %-1:200000 IJ SOLN
INTRAMUSCULAR | Status: AC
  Filled 2018-07-11: qty 10

## 2018-07-11 MED ORDER — LIDOCAINE-EPINEPHRINE 2 %-1:100000 IJ SOLN
20.0000 mL | Freq: Once | INTRAMUSCULAR | Status: AC
  Administered 2018-07-11: 20 mL via INTRADERMAL
  Filled 2018-07-11: qty 20

## 2018-07-11 NOTE — ED Notes (Signed)
NAD at this time. Pt is stable and going home.  

## 2018-07-11 NOTE — Discharge Instructions (Addendum)
Apply warm compresses and monitor for signs of infection. You may take tylenol for pain.    Follow up with your primary care doctor in 1 week for reevaluation and return to the ER for any new or worsening symptoms in the meantime including any signs of infection.

## 2018-07-11 NOTE — ED Triage Notes (Signed)
Patient states that he has swelling to his left elbow  - he also states that he had a fever earlier today  - he took 800 of motrin prior to arrival

## 2018-07-11 NOTE — ED Notes (Signed)
Patient transported to X-ray 

## 2018-07-11 NOTE — ED Provider Notes (Signed)
[place] EMERGENCY DEPARTMENT Provider Note   CSN: [id number] Arrival date & time: 07/11/18  1727     History   Chief Complaint Chief Complaint  Patient presents with  . Joint Swelling    HPI Alfred Martin is a 52 y.o. male.  HPI   Patient is a 52 year old male with a history of hypertension and gout who presents emergency department today for evaluation of left elbow pain that began 2 days ago.  Patient states that he injured his elbow by hitting it on a dresser about 5 days ago.  Did not develop pain until several days later.  States he has had some pain with bending his elbow.  No documented fevers at home though had chills earlier today with some nausea.  No vomiting, diarrhea, abdominal pain, chest pain shortness of breath or URI symptoms.  No urinary symptoms.  Past Medical History:  Diagnosis Date  . Gout   . HTN (hypertension)     Patient Active Problem List   Diagnosis Date Noted  . Morbid (severe) obesity due to excess calories ([place]) 05/23/2018  . Gout of multiple sites 11/21/2017  . Hyperlipidemia LDL goal <100 08/12/2017  . Joint swelling 08/25/2015  . Cardiomyopathy ([place]) 07/27/2015  . Abnormal echocardiogram 07/27/2015  . Dyspnea on exertion 06/20/2015  . HTN (hypertension) 06/01/2015  . Preventative health care 06/01/2015    Past Surgical History:  Procedure Laterality Date  . TONSILLECTOMY          Home Medications    Prior to Admission medications   Medication Sig Start Date End Date Taking? Authorizing Provider  allopurinol (ZYLOPRIM) 100 MG tablet TAKE 1 TABLET BY MOUTH TWICE A DAY 06/15/18   Carollee Herter, [name], DO  allopurinol (ZYLOPRIM) 300 MG tablet Take 1 tablet (300 mg total) by mouth daily. 05/29/18   Carollee Herter, Kendrick Fries R, DO  amLODipine-benazepril (LOTREL) 10-40 MG capsule TAKE 1 CAPSULE BY MOUTH EVERY DAY 06/15/18   Carollee Herter, [name], DO  ezetimibe (ZETIA) 10 MG tablet Take 1 tablet (10 mg total) by mouth daily. 08/15/16    [name] R, DO  fenofibrate 160 MG tablet Take 1 tablet (160 mg total) by mouth daily. 05/29/18   Carollee Herter, Kendrick Fries R, DO  fluticasone (FLONASE) 50 MCG/ACT nasal spray SPRAY 2 SPRAYS INTO EACH NOSTRIL EVERY DAY 07/09/18   Carollee Herter, Yvonne R, DO  hydrochlorothiazide (HYDRODIURIL) 25 MG tablet TAKE 1 TABLET BY MOUTH EVERY DAY 02/06/18   Carollee Herter, [name], DO  levocetirizine (XYZAL) 5 MG tablet TAKE 1 TABLET BY MOUTH EVERY DAY IN THE EVENING 07/06/18   [name] R, DO  metoprolol succinate (TOPROL-XL) 25 MG 24 hr tablet Take 1 tablet (25 mg total) by mouth daily. 01/21/18   [name], DO  sildenafil (VIAGRA) 100 MG tablet Take 0.5-1 tablets (50-100 mg total) by mouth daily as needed for erectile dysfunction. 01/23/16   [name], DO    Family History Family History  Problem Relation Age of Onset  . Arthritis Mother        Severe-- RA  . Diabetes Mother   . Hyperlipidemia Mother   . High blood pressure Mother   . Hypertension Mother   . Colon cancer Paternal Uncle   . Congestive Heart Failure Paternal Uncle   . Stroke Paternal Uncle   . Diabetes Father   . Hyperlipidemia Father   . High blood pressure Father   . Hypertension  Father   . Diabetes Brother   . Hypertension Brother   . HIV/AIDS Brother   . Breast cancer Maternal Aunt   . Arthritis Maternal Aunt        RA  . Hypertension Brother   . Drug abuse Brother        chf-- crack / cocaine  . Congestive Heart Failure Brother   . Arthritis Maternal Aunt   . Arthritis Maternal Aunt   . Heart disease Maternal Grandmother   . Breast cancer Paternal Grandmother     Social History Social History   Tobacco Use  . Smoking status: Never Smoker  . Smokeless tobacco: Never Used  Substance Use Topics  . Alcohol use: No    Alcohol/week: 0.0 standard drinks  . Drug use: No     Allergies   Patient has no known allergies.   Review of Systems Review of Systems  Constitutional:  Positive for chills.  Musculoskeletal:       Left elbow pain  Skin: Negative for wound.  Neurological: Negative for weakness and numbness.     Physical Exam Updated Vital Signs BP 125/85 (BP Location: Right Arm)   Pulse 65   Temp 98.5 F (36.9 C) (Oral)   Resp 16   Ht 5\' 9"  (1.753 m)   Wt 111.1 kg   SpO2 97%   BMI 36.18 kg/m   Physical Exam  Constitutional: He is oriented to person, place, and time. He appears well-developed and well-nourished. No distress.  Eyes: Conjunctivae are normal.  Cardiovascular: Normal rate and regular rhythm.  Pulmonary/Chest: Effort normal and breath sounds normal.  Abdominal: Soft. There is no tenderness.  Musculoskeletal:  ttp and swelling to the olecranon bursae. TTP to the medial and lateral epicondyles (medial>lateral).  Distal pulse sensation intact.  Has pain with flexion of the elbow.  No pain with extension.  Neurological: He is alert and oriented to person, place, and time.  Skin: Skin is warm and dry. Capillary refill takes less than 2 seconds.  Psychiatric: He has a normal mood and affect.   ED Treatments / Results  Labs (all labs ordered are listed, but only abnormal results are displayed) Labs Reviewed - No data to display  EKG None  Radiology Dg Elbow Complete Left  Result Date: 07/11/2018 CLINICAL DATA:  Acute onset posterior left elbow swelling and pain x2 days with fever. History of gout. EXAM: LEFT ELBOW - COMPLETE 3+ VIEW COMPARISON:  None. FINDINGS: Soft tissue swelling in the region of the olecranon bursa is noted. Enthesopathy at the triceps insertion is identified of the olecranon. No fracture, joint effusion or bone destruction. IMPRESSION: Soft tissue swelling posterior to the elbow joint compatible with olecranon bursitis. Given history of gout, conceivably a large tophus might account for this but given lack of erosive change believed less likely. Electronically Signed   By: Ashley Royalty M.D.   On: 07/11/2018 20:02     Procedures .Marland KitchenIncision and Drainage Date/Time: 07/12/2018 7:13 PM Performed by: Rodney Booze, PA-C Authorized by: Rodney Booze, PA-C   Consent:    Consent obtained:  Verbal   Consent given by:  Patient and parent   Risks discussed:  Bleeding, incomplete drainage and pain   Alternatives discussed:  No treatment Location:    Type:  Bursa   Location: elbow. Pre-procedure details:    Skin preparation:  Betadine Anesthesia (see MAR for exact dosages):    Anesthesia method:  Local infiltration Procedure details:    Incision  types:  Stab incision   Incision depth:  Dermal   Scalpel blade:  11   Drainage:  Bloody and serous   Drainage amount:  Moderate   Wound treatment:  Wound left open   Packing materials:  None Post-procedure details:    Patient tolerance of procedure:  Tolerated well, no immediate complications   (including critical care time)  Medications Ordered in ED Medications  lidocaine-EPINEPHrine (XYLOCAINE W/EPI) 2 %-1:100000 (with pres) injection 20 mL (20 mLs Intradermal Given 07/11/18 1939)    Initial Impression / Assessment and Plan / ED Course  I have reviewed the triage vital signs and the nursing notes.  Pertinent labs & imaging results that were available during my care of the patient were reviewed by me and considered in my medical decision making (see chart for details).  Patient is seen and examined by Dr. Gilford Raid who recommends treating the bursa.  Final Clinical Impressions(s) / ED Diagnoses   Final diagnoses:  Traumatic bursitis   Patient presenting with left elbow pain and swelling.  On exam has evidence of olecranon bursitis, likely traumatic from his recent injury.  Vitals stable.  Afebrile.  Lower suspicion for septic bursitis or septic arthritis.  He is mildly tender and there is some reduced range of motion due to pain.  The area was incised and drained and bloody and serous fluid were obtained.  Advised compresses and wound care  at home and follow-up with PCP in 1 week for reevaluation.  Advised him to return to the ER for any new or worsening symptoms in the meantime.  ED Discharge Orders    None       Bishop Dublin 07/12/18 1916    Isla Pence, MD 07/12/18 2005

## 2018-07-16 ENCOUNTER — Encounter: Payer: Self-pay | Admitting: Family Medicine

## 2018-07-17 ENCOUNTER — Encounter (HOSPITAL_BASED_OUTPATIENT_CLINIC_OR_DEPARTMENT_OTHER): Payer: Self-pay | Admitting: *Deleted

## 2018-07-17 ENCOUNTER — Emergency Department (HOSPITAL_BASED_OUTPATIENT_CLINIC_OR_DEPARTMENT_OTHER): Payer: BC Managed Care – PPO

## 2018-07-17 ENCOUNTER — Emergency Department (HOSPITAL_BASED_OUTPATIENT_CLINIC_OR_DEPARTMENT_OTHER)
Admission: EM | Admit: 2018-07-17 | Discharge: 2018-07-17 | Disposition: A | Discharge status: Home / Self Care | Payer: BC Managed Care – PPO | Attending: Emergency Medicine | Admitting: Emergency Medicine

## 2018-07-17 ENCOUNTER — Telehealth: Payer: Self-pay

## 2018-07-17 ENCOUNTER — Other Ambulatory Visit: Payer: Self-pay

## 2018-07-17 DIAGNOSIS — R6883 Chills (without fever): Secondary | ICD-10-CM | POA: Diagnosis not present

## 2018-07-17 DIAGNOSIS — W228XXD Striking against or struck by other objects, subsequent encounter: Secondary | ICD-10-CM | POA: Diagnosis not present

## 2018-07-17 DIAGNOSIS — M25522 Pain in left elbow: Secondary | ICD-10-CM | POA: Diagnosis present

## 2018-07-17 DIAGNOSIS — R2232 Localized swelling, mass and lump, left upper limb: Secondary | ICD-10-CM | POA: Diagnosis not present

## 2018-07-17 DIAGNOSIS — M7022 Olecranon bursitis, left elbow: Secondary | ICD-10-CM | POA: Diagnosis not present

## 2018-07-17 DIAGNOSIS — Z79899 Other long term (current) drug therapy: Secondary | ICD-10-CM | POA: Diagnosis not present

## 2018-07-17 DIAGNOSIS — I10 Essential (primary) hypertension: Secondary | ICD-10-CM | POA: Diagnosis not present

## 2018-07-17 DIAGNOSIS — Y9389 Activity, other specified: Secondary | ICD-10-CM | POA: Insufficient documentation

## 2018-07-17 LAB — COMPREHENSIVE METABOLIC PANEL
ALBUMIN: 3.9 g/dL (ref 3.5–5.0)
ALT: 47 U/L — ABNORMAL HIGH (ref 0–44)
AST: 55 U/L — AB (ref 15–41)
Alkaline Phosphatase: 30 U/L — ABNORMAL LOW (ref 38–126)
Anion gap: 11 (ref 5–15)
BUN: 25 mg/dL — AB (ref 6–20)
CO2: 28 mmol/L (ref 22–32)
Calcium: 9.1 mg/dL (ref 8.9–10.3)
Chloride: 98 mmol/L (ref 98–111)
Creatinine, Ser: 1.46 mg/dL — ABNORMAL HIGH (ref 0.61–1.24)
GFR calc Af Amer: >60 mL/min (ref >60)
GFR, EST NON AFRICAN AMERICAN: 53 mL/min — AB (ref >60)
Glucose, Bld: 96 mg/dL (ref 70–99)
POTASSIUM: 3.4 mmol/L — AB (ref 3.5–5.1)
Sodium: 137 mmol/L (ref 135–145)
Total Bilirubin: 0.5 mg/dL (ref 0.3–1.2)
Total Protein: 7.6 g/dL (ref 6.5–8.1)

## 2018-07-17 LAB — CBC WITH DIFFERENTIAL/PLATELET
BASOS ABS: 0 10*3/uL (ref 0.0–0.1)
BASOS PCT: 0 %
Eosinophils Absolute: 0.2 10*3/uL (ref 0.0–0.7)
Eosinophils Relative: 3 %
HCT: 41.9 % (ref 39.0–52.0)
Hemoglobin: 14 g/dL (ref 13.0–17.0)
Lymphocytes Relative: 43 %
Lymphs Abs: 2.4 10*3/uL (ref 0.7–4.0)
MCH: 27.1 pg (ref 26.0–34.0)
MCHC: 33.4 g/dL (ref 30.0–36.0)
MCV: 81 fL (ref 78.0–100.0)
MONO ABS: 0.8 10*3/uL (ref 0.1–1.0)
Monocytes Relative: 13 %
Neutro Abs: 2.3 10*3/uL (ref 1.7–7.7)
Neutrophils Relative %: 41 %
Platelets: 212 10*3/uL (ref 150–400)
RBC: 5.17 MIL/uL (ref 4.22–5.81)
RDW: 14.1 % (ref 11.5–15.5)
WBC: 5.7 10*3/uL (ref 4.0–10.5)

## 2018-07-17 MED ORDER — SULFAMETHOXAZOLE-TRIMETHOPRIM 800-160 MG PO TABS: 1.0000 | ORAL_TABLET | Freq: Two times a day (BID) | ORAL | 0 refills | Status: DC

## 2018-07-17 MED ORDER — KETOROLAC TROMETHAMINE 15 MG/ML IJ SOLN
15.0000 mg | Freq: Once | INTRAMUSCULAR | Status: AC
  Administered 2018-07-17: 15 mg via INTRAVENOUS
  Filled 2018-07-17: qty 1

## 2018-07-17 MED ORDER — IOPAMIDOL (ISOVUE-300) INJECTION 61%
100.0000 mL | Freq: Once | INTRAVENOUS | Status: AC | PRN
  Administered 2018-07-17: 100 mL via INTRAVENOUS

## 2018-07-17 MED ORDER — HYDROCODONE-ACETAMINOPHEN 5-325 MG PO TABS: 1.0000 | ORAL_TABLET | Freq: Four times a day (QID) | ORAL | 0 refills | Status: DC | PRN

## 2018-07-17 MED ORDER — SODIUM CHLORIDE 0.9 % IV SOLN
INTRAVENOUS | Status: DC | PRN
  Administered 2018-07-17: 500 mL via INTRAVENOUS

## 2018-07-17 MED ORDER — SULFAMETHOXAZOLE-TRIMETHOPRIM 800-160 MG PO TABS: 1.0000 | ORAL_TABLET | Freq: Two times a day (BID) | ORAL | 0 refills | Status: AC

## 2018-07-17 MED ORDER — CEPHALEXIN 500 MG PO CAPS: 500.0000 mg | ORAL_CAPSULE | Freq: Three times a day (TID) | ORAL | 0 refills | Status: DC

## 2018-07-17 MED ORDER — CLINDAMYCIN PHOSPHATE 900 MG/50ML IV SOLN
900.0000 mg | Freq: Once | INTRAVENOUS | Status: AC
  Administered 2018-07-17: 900 mg via INTRAVENOUS
  Filled 2018-07-17: qty 50

## 2018-07-17 MED ORDER — CEPHALEXIN 500 MG PO CAPS: 500.0000 mg | ORAL_CAPSULE | Freq: Three times a day (TID) | ORAL | 0 refills | Status: AC

## 2018-07-17 MED ORDER — LIDOCAINE-EPINEPHRINE (PF) 2 %-1:200000 IJ SOLN
10.0000 mL | Freq: Once | INTRAMUSCULAR | Status: AC
  Administered 2018-07-17: 10 mL via INTRADERMAL
  Filled 2018-07-17 (×2): qty 10

## 2018-07-17 NOTE — ED Provider Notes (Signed)
..  Incision and Drainage Date/Time: 07/17/2018 11:46 PM Performed by: Duffy Bruce, MD Authorized by: Duffy Bruce, MD   Consent:    Consent obtained:  Verbal   Consent given by:  Patient   Risks discussed:  Bleeding, damage to other organs, incomplete drainage, infection and pain   Alternatives discussed:  Alternative treatment and delayed treatment Location:    Type:  Bursa   Size:  3 x 3   Location: Left olecranon. Pre-procedure details:    Skin preparation:  Betadine Anesthesia (see MAR for exact dosages):    Anesthesia method:  Local infiltration   Local anesthetic:  Lidocaine 1% WITH epi Procedure type:    Complexity:  Simple Procedure details:    Incision types:  Single straight   Incision depth:  Dermal   Scalpel blade:  11   Wound management:  Probed and deloculated and irrigated with saline   Drainage:  Serous   Drainage amount:  Copious   Wound treatment: Single simple interrupted suture with 4-0 Prolene.   Packing materials:  None Post-procedure details:    Patient tolerance of procedure:  Tolerated well, no immediate complications      Duffy Bruce, MD 07/17/18 [phone]

## 2018-07-17 NOTE — ED Notes (Signed)
Alert, NAD, calm, interactive, resps e/u, speaking in clear complete sentences, no dyspnea noted, skin W&D, VSS, rates pain "tolerable, 5/10", also chills, (denies: other sx), skin, CMS and ROM intact.

## 2018-07-17 NOTE — ED Provider Notes (Signed)
[facility] EMERGENCY DEPARTMENT Provider Note   CSN: [id number] Arrival date & time: 07/17/18  1648     History   Chief Complaint Chief Complaint  Patient presents with  . Chills    HPI Alfred Martin is a 52 y.o. male with a history of gout, hyperlipidemia, cardiomyopathy, and HTN who presents to the emergency department his PCPs office with a chief complaint of left elbow pain and swelling.  Patient endorses left elbow pain and swelling that initially began 8 days ago after he injured his left elbow by hitting it on a dresser several days earlier.  He was seen and evaluated in the emergency department on 8/10 where he was diagnosed with traumatic bursitis and had an I&D of the bursa.  He reports significant improvement in his pain and range of motion of the left elbow, but reports the swelling returned about 4 days ago.  He reports that he has been having multiple episodes of shaking chills since the onset of his symptoms.  He reports that he has been feeling generally unwell and "feverish", but has not checked his temperature at home.  He has been treating his symptoms at home with 400 mg of ibuprofen every 2-4 hours.  He reports that his mother gave him 3 tablets of Keflex.  He took 1 tab daily for the last 3 days with the last tablet being yesterday.  He states that he felt significant improvement in the swelling after taking the Keflex.  He reports that he has been compliant with his home gout medications and has not had a gout flareup in over a year.  No recent missed doses.  He states that the pain initially felt like a gout flare, but he does not feel as if his current symptoms feel like gout.  No history of steroid injections in the left elbow.  He states that he cannot recall ever having a gout flare in the left elbow and has previously had flares in the right elbow, knees, and ankles.  He denies numbness, weakness, left wrist or shoulder pain.   The history is provided  by the patient. No language interpreter was used.    Past Medical History:  Diagnosis Date  . Gout   . HTN (hypertension)     Patient Active Problem List   Diagnosis Date Noted  . Morbid (severe) obesity due to excess calories ([place]) 05/23/2018  . Gout of multiple sites 11/21/2017  . Hyperlipidemia LDL goal <100 08/12/2017  . Joint swelling 08/25/2015  . Cardiomyopathy ([place]) 07/27/2015  . Abnormal echocardiogram 07/27/2015  . Dyspnea on exertion 06/20/2015  . HTN (hypertension) 06/01/2015  . Preventative health care 06/01/2015    Past Surgical History:  Procedure Laterality Date  . TONSILLECTOMY          Home Medications    Prior to Admission medications   Medication Sig Start Date End Date Taking? Authorizing Provider  allopurinol (ZYLOPRIM) 100 MG tablet TAKE 1 TABLET BY MOUTH TWICE A DAY 06/15/18   Carollee Herter, [name], DO  allopurinol (ZYLOPRIM) 300 MG tablet Take 1 tablet (300 mg total) by mouth daily. 05/29/18   Carollee Herter, Kendrick Fries R, DO  amLODipine-benazepril (LOTREL) 10-40 MG capsule TAKE 1 CAPSULE BY MOUTH EVERY DAY 06/15/18   Carollee Herter, [name], DO  ezetimibe (ZETIA) 10 MG tablet Take 1 tablet (10 mg total) by mouth daily. 08/15/16   [name] R, DO  fenofibrate 160 MG tablet Take 1 tablet (160 mg  total) by mouth daily. 05/29/18   Carollee Herter, Kendrick Fries R, DO  fluticasone (FLONASE) 50 MCG/ACT nasal spray SPRAY 2 SPRAYS INTO EACH NOSTRIL EVERY DAY 07/09/18   Carollee Herter, Yvonne R, DO  hydrochlorothiazide (HYDRODIURIL) 25 MG tablet TAKE 1 TABLET BY MOUTH EVERY DAY 02/06/18   Carollee Herter, [name], DO  levocetirizine (XYZAL) 5 MG tablet TAKE 1 TABLET BY MOUTH EVERY DAY IN THE EVENING 07/06/18   [name] R, DO  metoprolol succinate (TOPROL-XL) 25 MG 24 hr tablet Take 1 tablet (25 mg total) by mouth daily. 01/21/18   [name], DO  sildenafil (VIAGRA) 100 MG tablet Take 0.5-1 tablets (50-100 mg total) by mouth daily as needed for erectile  dysfunction. 01/23/16   [name], DO    Family History Family History  Problem Relation Age of Onset  . Arthritis Mother        Severe-- RA  . Diabetes Mother   . Hyperlipidemia Mother   . High blood pressure Mother   . Hypertension Mother   . Colon cancer Paternal Uncle   . Congestive Heart Failure Paternal Uncle   . Stroke Paternal Uncle   . Diabetes Father   . Hyperlipidemia Father   . High blood pressure Father   . Hypertension Father   . Diabetes Brother   . Hypertension Brother   . HIV/AIDS Brother   . Breast cancer Maternal Aunt   . Arthritis Maternal Aunt        RA  . Hypertension Brother   . Drug abuse Brother        chf-- crack / cocaine  . Congestive Heart Failure Brother   . Arthritis Maternal Aunt   . Arthritis Maternal Aunt   . Heart disease Maternal Grandmother   . Breast cancer Paternal Grandmother     Social History Social History   Tobacco Use  . Smoking status: Never Smoker  . Smokeless tobacco: Never Used  Substance Use Topics  . Alcohol use: No    Alcohol/week: 0.0 standard drinks  . Drug use: No     Allergies   Patient has no known allergies.   Review of Systems Review of Systems  Constitutional: Positive for chills and fever. Negative for appetite change.  Respiratory: Negative for shortness of breath.   Cardiovascular: Negative for chest pain.  Gastrointestinal: Negative for abdominal pain.  Genitourinary: Negative for dysuria.  Musculoskeletal: Positive for arthralgias, joint swelling and myalgias. Negative for back pain, neck pain and neck stiffness.  Skin: Negative for rash.  Allergic/Immunologic: Negative for immunocompromised state.  Neurological: Negative for weakness, numbness and headaches.  Psychiatric/Behavioral: Negative for confusion.     Physical Exam Updated Vital Signs BP 137/86 (BP Location: Left Arm)   Pulse 62   Temp 99 F (37.2 C) (Oral)   Resp 11   Ht 5\' 9"  (1.753 m)   Wt 108.9 kg    SpO2 99%   BMI 35.44 kg/m   Physical Exam  Constitutional: He appears well-developed. No distress.  HENT:  Head: Normocephalic.  Eyes: Conjunctivae are normal.  Neck: Neck supple.  Cardiovascular: Normal rate, regular rhythm, normal heart sounds and intact distal pulses. Exam reveals no gallop and no friction rub.  No murmur heard. Pulmonary/Chest: Effort normal. No stridor. No respiratory distress. He has no wheezes. He has no rales. He exhibits no tenderness.  Abdominal: Soft. He exhibits no distension.  Musculoskeletal: He exhibits edema and tenderness. He exhibits no deformity.  Swelling  noted to the posterior aspect of the left elbow.  No obvious tophi.  The area is mildly warm, but not erythematous.  Diffusely tender to palpation to the left olecranon with point tenderness distal portion of the triceps muscle.  Full active and passive range of motion of the left wrist, elbow, and shoulder.  Left wrist and shoulder are nontender to palpation.  Radial pulses are 2+ and symmetric.  Sensation is intact throughout.  5 out of 5 strength against resistance to bilateral upper extremities.  Increased pain with flexion at approximately 90 degrees of the left elbow.  Neurological: He is alert.  Skin: Skin is warm and dry. He is not diaphoretic.  Psychiatric: His behavior is normal.  Nursing note and vitals reviewed.  ED Treatments / Results  Labs (all labs ordered are listed, but only abnormal results are displayed) Labs Reviewed  COMPREHENSIVE METABOLIC PANEL - Abnormal; Notable for the following components:      Result Value   Potassium 3.4 (*)    BUN 25 (*)    Creatinine, Ser 1.46 (*)    AST 55 (*)    ALT 47 (*)    Alkaline Phosphatase 30 (*)    GFR calc non Af Amer 53 (*)    All other components within normal limits  CULTURE, BLOOD (ROUTINE X 2)  CULTURE, BLOOD (ROUTINE X 2)  CBC WITH DIFFERENTIAL/PLATELET    EKG None  Radiology Ct Elbow Left W Contrast  Result Date:  07/17/2018 CLINICAL DATA:  Left elbow swelling and increased warmth. The patient hit the elbow on a counter edge 10 days ago. He had fluid incised and drained four days ago. History of gout. EXAM: CT OF THE UPPER LEFT EXTREMITY WITH CONTRAST TECHNIQUE: Multidetector CT imaging of the upper left extremity was performed according to the standard protocol following intravenous contrast administration. COMPARISON:  Left elbow radiographs dated 07/11/2018. CONTRAST:  150mL ISOVUE-300 IOPAMIDOL (ISOVUE-300) INJECTION 61% FINDINGS: Mildly irregular fluid collection posterior to the olecranon with mildly thickened and irregular diffuse surrounding enhancing soft tissue. The fluid collection measures 2.6 x 2.0 cm on image number 137 series 4. This measures 3.6 cm in length on sagittal image number 52. There is edema an the adjacent subcutaneous fat. No calcifications. Moderately large and minimally fragmented olecranon spur. Otherwise, no fracture seen. Minimal coronoid process spur formation. No bone destruction or periosteal reaction. No soft tissue gas. IMPRESSION: 1. 2.6 x 2.0 x 2.0 cm abscess posterior to the olecranon at the location of the olecranon bursa. 2. No evidence of osteomyelitis. 3. Moderately large and minimally fragmented olecranon spur. Electronically Signed   By: Claudie Revering M.D.   On: 07/17/2018 19:47    Procedures Procedures (including critical care time)  Medications Ordered in ED Medications  clindamycin (CLEOCIN) IVPB 900 mg (900 mg Intravenous New Bag/Given 07/17/18 2018)  0.9 %  sodium chloride infusion (500 mLs Intravenous New Bag/Given 07/17/18 2017)  iopamidol (ISOVUE-300) 61 % injection 100 mL (100 mLs Intravenous Contrast Given 07/17/18 1847)  ketorolac (TORADOL) 15 MG/ML injection 15 mg (15 mg Intravenous Given 07/17/18 2019)     Initial Impression / Assessment and Plan / ED Course  I have reviewed the triage vital signs and the nursing notes.  Pertinent labs & imaging results  that were available during my care of the patient were reviewed by me and considered in my medical decision making (see chart for details).      52 year old male with a history of gout, hyperlipidemia,  cardiomyopathy, and HTN presenting with left elbow pain and swelling, general malaise, and chills.  He was treated in the ED on 07/11/2018 and had an I&D of the left elbow.  Took 3 tablets of Keflex at home for the last 3 days, but has had no other antibiotics.  Patient declined rectal temp.  No tachycardia the patient is hemodynamically stable in the ED.  No leukocytosis on labs.  Cultures x2 are pending.  Labs are otherwise notable for creatinine of 1.46, which appears to be close to the patient's baseline and mildly elevated transaminases of AST 55 and ALT 47.  It appears that he is chronically had mildly elevated transaminases.  CT left elbow with contrast is pending. Patient care transferred to Dr. Ellender Hose at the end of my shift. Patient presentation, ED course, and plan of care discussed with review of all pertinent labs and imaging. Please see his/her note for further details regarding further ED course and disposition.  Final Clinical Impressions(s) / ED Diagnoses   Final diagnoses:  None    ED Discharge Orders    None       Ashly Yepez A, PA-C 07/17/18 2023    Duffy Bruce, MD 07/19/18 [phone]

## 2018-07-17 NOTE — ED Triage Notes (Signed)
Pt /o " chills" x 2 weeks , seen here 8/10 for fluid drain of left elbow, sent here today by PMD for EVAL

## 2018-07-17 NOTE — ED Notes (Signed)
No changes, alert, NAD, calm, interactive, registration and family at [facility], IVF & clindamycin infusing.

## 2018-07-17 NOTE — Telephone Encounter (Signed)
Please call on monday

## 2018-07-17 NOTE — Telephone Encounter (Signed)
Author phoned pt to f/u bursitis. Author stated he went to ED on 8/10 and received I&D, but is still having swelling, fever, and chills. Pt. has been taking tylenol and ibuprofen ATC.  Author asked pt. If he is taking allopurinol and pt. stated he was "religiously". Author convinced pt. To return to ED or urgent care to be re-evaluated, and pt. stated he would return to [facility] ED tonight when he gets off of work. Author phoned ED to notify them and gave report to on-staff provider who reviewed his medical record with author over the phone.

## 2018-07-20 LAB — AEROBIC CULTURE  (SUPERFICIAL SPECIMEN)
CULTURE: NO GROWTH
GRAM STAIN: NONE SEEN

## 2018-07-20 LAB — AEROBIC CULTURE W GRAM STAIN (SUPERFICIAL SPECIMEN): Special Requests: NORMAL

## 2018-07-20 NOTE — Telephone Encounter (Signed)
Author phoned pt. to f/u on ED visit for R elbow bursitis. No answer; author left detailed VM asking for call back [phone].

## 2018-07-22 LAB — CULTURE, BLOOD (ROUTINE X 2)
Culture: NO GROWTH
Culture: NO GROWTH
SPECIAL REQUESTS: ADEQUATE
Special Requests: ADEQUATE

## 2018-07-23 ENCOUNTER — Ambulatory Visit (AMBULATORY_SURGERY_CENTER): Payer: BC Managed Care – PPO | Admitting: *Deleted

## 2018-07-23 ENCOUNTER — Other Ambulatory Visit: Payer: Self-pay

## 2018-07-23 ENCOUNTER — Encounter: Payer: Self-pay | Admitting: Gastroenterology

## 2018-07-23 VITALS — Ht 69.0 in | Wt 248.2 lb

## 2018-07-23 DIAGNOSIS — Z1211 Encounter for screening for malignant neoplasm of colon: Secondary | ICD-10-CM

## 2018-07-23 MED ORDER — NA SULFATE-K SULFATE-MG SULF 17.5-3.13-1.6 GM/177ML PO SOLN: 1.0000 | Freq: Once | ORAL | 0 refills | Status: AC

## 2018-07-23 NOTE — Progress Notes (Signed)
Patient denies any allergies to egg or soy products. Patient denies complications with anesthesia/sedation.  Patient denies oxygen use at home and denies diet medications. Pamphlet given on Colonoscopy procedure.

## 2018-07-29 ENCOUNTER — Ambulatory Visit (INDEPENDENT_AMBULATORY_CARE_PROVIDER_SITE_OTHER): Payer: BC Managed Care – PPO | Admitting: Orthopaedic Surgery

## 2018-07-29 ENCOUNTER — Encounter (INDEPENDENT_AMBULATORY_CARE_PROVIDER_SITE_OTHER): Payer: Self-pay | Admitting: Orthopaedic Surgery

## 2018-07-29 DIAGNOSIS — M7022 Olecranon bursitis, left elbow: Secondary | ICD-10-CM

## 2018-07-29 DIAGNOSIS — M1009 Idiopathic gout, multiple sites: Secondary | ICD-10-CM

## 2018-07-29 HISTORY — DX: Olecranon bursitis, left elbow: M70.22

## 2018-07-29 NOTE — Progress Notes (Signed)
Patient is a very pleasant 52 year old gentleman with gout who had a flareup of gout pain and olecranon bursitis was seen at [facility] in the emergency room several times.  At the last visit I had them open up his olecranon bursa drained the area and treated with local wound care.  The place a suture and it had him on Keflex.  He said almost complete resolution of his symptoms.  He has a little bit of burning but nothing significant.  He has not had a gout flareup at least a year before then.  He is on allopurinol chronically.  On examination of his left elbow there is no redness.  There is just some firmness of the soft tissue there is minimal.  There is no evidence of infection.  The skin wrinkles nicely.  Range of motion the elbow is almost entirely full there is minimal pain.  I did remove one suture.  I let her know he is tired and does not at this point if he has any recurrence of symptoms at all or needs anything aspirated or drained does not hesitate to give [place] a call to come back and see [place].  All question concerns were answered and addressed.  Follow be as needed.

## 2018-08-05 ENCOUNTER — Telehealth: Payer: Self-pay

## 2018-08-05 ENCOUNTER — Encounter (INDEPENDENT_AMBULATORY_CARE_PROVIDER_SITE_OTHER): Payer: BC Managed Care – PPO

## 2018-08-05 NOTE — Telephone Encounter (Signed)
Pts EF is 20. Per guidelines his colon cannot be done in the [place]. Appt cancelled and pt aware and knows that procedure needs to be done at the hospital. Pt states at this point he just wants to cancel the appt, does not want to reschedule the appt. Dr. Lyndel Safe notified.

## 2018-08-06 ENCOUNTER — Encounter: Payer: BC Managed Care – PPO | Admitting: Internal Medicine

## 2018-08-06 ENCOUNTER — Encounter: Payer: BC Managed Care – PPO | Admitting: Gastroenterology

## 2018-08-08 ENCOUNTER — Other Ambulatory Visit: Payer: Self-pay | Admitting: Family Medicine

## 2018-08-08 DIAGNOSIS — I10 Essential (primary) hypertension: Secondary | ICD-10-CM

## 2018-08-09 ENCOUNTER — Encounter: Payer: Self-pay | Admitting: Family Medicine

## 2018-08-20 ENCOUNTER — Ambulatory Visit (INDEPENDENT_AMBULATORY_CARE_PROVIDER_SITE_OTHER): Payer: BC Managed Care – PPO | Admitting: Family Medicine

## 2018-08-20 ENCOUNTER — Encounter (INDEPENDENT_AMBULATORY_CARE_PROVIDER_SITE_OTHER): Payer: Self-pay | Admitting: Family Medicine

## 2018-08-20 VITALS — BP 132/83 | HR 63 | Temp 98.1°F | Ht 68.0 in | Wt 238.0 lb

## 2018-08-20 DIAGNOSIS — I509 Heart failure, unspecified: Secondary | ICD-10-CM

## 2018-08-20 DIAGNOSIS — Z9189 Other specified personal risk factors, not elsewhere classified: Secondary | ICD-10-CM

## 2018-08-20 DIAGNOSIS — R0602 Shortness of breath: Secondary | ICD-10-CM | POA: Diagnosis not present

## 2018-08-20 DIAGNOSIS — Z0289 Encounter for other administrative examinations: Secondary | ICD-10-CM

## 2018-08-20 DIAGNOSIS — Z1331 Encounter for screening for depression: Secondary | ICD-10-CM | POA: Diagnosis not present

## 2018-08-20 DIAGNOSIS — R5383 Other fatigue: Secondary | ICD-10-CM

## 2018-08-20 DIAGNOSIS — Z6836 Body mass index (BMI) 36.0-36.9, adult: Secondary | ICD-10-CM

## 2018-08-21 LAB — VITAMIN B12: Vitamin B-12: 743 pg/mL (ref 232–1245)

## 2018-08-21 LAB — COMPREHENSIVE METABOLIC PANEL
A/G RATIO: 1.5 (ref 1.2–2.2)
ALT: 65 IU/L — AB (ref 0–44)
AST: 48 IU/L — AB (ref 0–40)
Albumin: 4.4 g/dL (ref 3.5–5.5)
Alkaline Phosphatase: 36 IU/L — ABNORMAL LOW (ref 39–117)
BUN/Creatinine Ratio: 10 (ref 9–20)
BUN: 12 mg/dL (ref 6–24)
Bilirubin Total: 0.6 mg/dL (ref 0.0–1.2)
CALCIUM: 9.5 mg/dL (ref 8.7–10.2)
CO2: 25 mmol/L (ref 20–29)
Chloride: 100 mmol/L (ref 96–106)
Creatinine, Ser: 1.26 mg/dL (ref 0.76–1.27)
GFR calc non Af Amer: 65 mL/min/{1.73_m2} (ref >59)
GFR, EST AFRICAN AMERICAN: 75 mL/min/{1.73_m2} (ref >59)
GLUCOSE: 91 mg/dL (ref 65–99)
Globulin, Total: 2.9 g/dL (ref 1.5–4.5)
POTASSIUM: 4 mmol/L (ref 3.5–5.2)
Sodium: 142 mmol/L (ref 134–144)
TOTAL PROTEIN: 7.3 g/dL (ref 6.0–8.5)

## 2018-08-21 LAB — VITAMIN D 25 HYDROXY (VIT D DEFICIENCY, FRACTURES): VIT D 25 HYDROXY: 16.9 ng/mL — AB (ref 30.0–100.0)

## 2018-08-21 LAB — HEMOGLOBIN A1C
ESTIMATED AVERAGE GLUCOSE: 134 mg/dL
Hgb A1c MFr Bld: 6.3 % — ABNORMAL HIGH (ref 4.8–5.6)

## 2018-08-21 LAB — FOLATE: Folate: 18.4 ng/mL (ref >3.0)

## 2018-08-21 LAB — INSULIN, RANDOM: INSULIN: 26.4 u[IU]/mL — AB (ref 2.6–24.9)

## 2018-08-25 NOTE — Progress Notes (Signed)
Office: [phone]  /  Fax: [phone]   Dear Dr. Carollee Herter,   Thank you for referring Alfred Martin to our clinic. The following note includes my evaluation and treatment recommendations.  HPI:   Chief Complaint: OBESITY    [name] has been referred by [name], DO for consultation regarding his obesity and obesity related comorbidities.    Alfred Martin (MR# [id number]) is a 52 y.o. male who presents on 08/20/2018 for obesity evaluation and treatment. Current BMI is Body mass index is 36.19 kg/m.Alfred Martin has been struggling with his weight for many years and has been unsuccessful in either losing weight, maintaining weight loss, or reaching his healthy weight goal.     [name] attended our information session and states he is currently in the action stage of change and ready to dedicate time achieving and maintaining a healthier weight. [name] is interested in becoming our patient and working on intensive lifestyle modifications including (but not limited to) diet, exercise and weight loss.    [name] states his family eats meals together he thinks his family will eat healthier with  him his desired weight loss is 48 lbs he started gaining weight after the age of 11 his heaviest weight ever was 253 lbs he has significant food cravings issues  he snacks frequently in the evenings he skips meals frequently   Fatigue Alfred Martin feels his energy is lower than it should be. This has worsened with weight gain and has not worsened recently. Alfred Martin admits to daytime somnolence and  denies waking up still tired. Patient is at risk for obstructive sleep apnea. Patent has a history of symptoms of daytime fatigue. Patient generally gets 7 hours of sleep per night, and states they generally have generally restful sleep. Snoring is present. Apneic episodes are not present. Epworth Sleepiness Score is 4.  Dyspnea on exertion Alfred Martin notes increasing shortness of breath with exercising and seems to be  worsening over time with weight gain. He notes getting out of breath sooner with activity than he used to. This has not gotten worse recently.  EKG-diffuse T wave abnormality (similar to EKG in 2016). [name] denies orthopnea.  Congestive Heart Failure Alfred Martin's echocardiogram from 2016 shows EF of 20-25%. He is on HF cocktail. He is trying to switch Cardiologist back to [employer].  At risk for cardiovascular disease Alfred Martin is at a higher than average risk for cardiovascular disease due to obesity and congestive heart failure. He currently denies any chest pain.  Depression Screen Alfred Martin's Food and Mood (modified PHQ-9) score was  Depression screen PHQ 2/9 08/20/2018  Decreased Interest 0  Down, Depressed, Hopeless 0  PHQ - 2 Score 0  Altered sleeping 0  Tired, decreased energy 1  Change in appetite 1  Feeling bad or failure about yourself  0  Trouble concentrating 0  Moving slowly or fidgety/restless 0  Suicidal thoughts 0  PHQ-9 Score 2  Difficult doing work/chores Not difficult at all    ALLERGIES: No Known Allergies  MEDICATIONS: Current Outpatient Medications on File Prior to Visit  Medication Sig Dispense Refill  . allopurinol (ZYLOPRIM) 300 MG tablet Take 1 tablet (300 mg total) by mouth daily. 90 tablet 2  . amLODipine-benazepril (LOTREL) 10-40 MG capsule TAKE 1 CAPSULE BY MOUTH EVERY DAY 90 capsule 1  . fluticasone (FLONASE) 50 MCG/ACT nasal spray SPRAY 2 SPRAYS INTO EACH NOSTRIL EVERY DAY 48 g 1  . hydrochlorothiazide (HYDRODIURIL) 25 MG tablet TAKE 1 TABLET BY MOUTH EVERY DAY 90  tablet 1  . metoprolol succinate (TOPROL-XL) 25 MG 24 hr tablet Take 1 tablet (25 mg total) by mouth daily. 90 tablet 1   No current facility-administered medications on file prior to visit.     PAST MEDICAL HISTORY: Past Medical History:  Diagnosis Date  . Allergy   . CHF (congestive heart failure) ([place])   . Gout   . HTN (hypertension)   . Hyperlipidemia    diet controlled and exercise  .  Joint pain     PAST SURGICAL HISTORY: Past Surgical History:  Procedure Laterality Date  . TONSILLECTOMY      SOCIAL HISTORY: Social History   Tobacco Use  . Smoking status: Never Smoker  . Smokeless tobacco: Never Used  Substance Use Topics  . Alcohol use: No    Alcohol/week: 0.0 standard drinks  . Drug use: No    FAMILY HISTORY: Family History  Problem Relation Age of Onset  . Arthritis Mother        Severe-- RA  . Diabetes Mother   . Hyperlipidemia Mother   . High blood pressure Mother   . Hypertension Mother   . Obesity Mother   . Colon cancer Paternal Uncle   . Congestive Heart Failure Paternal Uncle   . Stroke Paternal Uncle   . Diabetes Father   . Hyperlipidemia Father   . High blood pressure Father   . Hypertension Father   . Liver disease Father   . Diabetes Brother   . Hypertension Brother   . HIV/AIDS Brother   . Breast cancer Maternal Aunt   . Arthritis Maternal Aunt        RA  . Hypertension Brother   . Drug abuse Brother        chf-- crack / cocaine  . Congestive Heart Failure Brother   . Arthritis Maternal Aunt   . Arthritis Maternal Aunt   . Heart disease Maternal Grandmother   . Breast cancer Paternal Grandmother   . Colon polyps Neg Hx   . Rectal cancer Neg Hx   . Stomach cancer Neg Hx     ROS: Review of Systems  Constitutional: Positive for malaise/fatigue. Negative for weight loss.  HENT:       + Dry mouth  Respiratory: Positive for shortness of breath (with exertion).   Cardiovascular: Negative for chest pain and orthopnea.    PHYSICAL EXAM: Blood pressure 132/83, pulse 63, temperature 98.1 F (36.7 C), temperature source Oral, height 5\' 8"  (1.727 m), weight 238 lb (108 kg), SpO2 90 %. Body mass index is 36.19 kg/m. Physical Exam  Constitutional: He is oriented to person, place, and time. He appears well-developed and well-nourished.  HENT:  Head: Normocephalic and atraumatic.  Nose: Nose normal.  Eyes: EOM are normal.  No scleral icterus.  Neck: Normal range of motion. Neck supple. No thyromegaly present.  Cardiovascular: Normal rate and regular rhythm.  Pulmonary/Chest: Effort normal. No respiratory distress.  Abdominal: Soft. There is no tenderness.  + Obesity  Musculoskeletal:  Range of Motion normal in all 4 extremities Trace edema noted in bilateral lower extremities  Neurological: He is alert and oriented to person, place, and time. Coordination normal.  Skin: Skin is warm and dry.  Psychiatric: He has a normal mood and affect. His behavior is normal.  Vitals reviewed.   RECENT LABS AND TESTS: BMET    Component Value Date/Time   NA 142 08/20/2018 1227   K 4.0 08/20/2018 1227   CL 100 08/20/2018 1227  CO2 25 08/20/2018 1227   GLUCOSE 91 08/20/2018 1227   GLUCOSE 96 07/17/2018 1727   BUN 12 08/20/2018 1227   CREATININE 1.26 08/20/2018 1227   CREATININE 1.41 (H) 05/22/2018 1636   CALCIUM 9.5 08/20/2018 1227   GFRNONAA 65 08/20/2018 1227   GFRAA 75 08/20/2018 1227   Lab Results  Component Value Date   HGBA1C 6.3 (H) 08/20/2018   Lab Results  Component Value Date   INSULIN 26.4 (H) 08/20/2018   CBC    Component Value Date/Time   WBC 5.7 07/17/2018 1727   RBC 5.17 07/17/2018 1727   HGB 14.0 07/17/2018 1727   HCT 41.9 07/17/2018 1727   PLT 212 07/17/2018 1727   MCV 81.0 07/17/2018 1727   MCH 27.1 07/17/2018 1727   MCHC 33.4 07/17/2018 1727   RDW 14.1 07/17/2018 1727   LYMPHSABS 2.4 07/17/2018 1727   MONOABS 0.8 07/17/2018 1727   EOSABS 0.2 07/17/2018 1727   BASOSABS 0.0 07/17/2018 1727   Iron/TIBC/Ferritin/ %Sat No results found for: IRON, TIBC, FERRITIN, IRONPCTSAT Lipid Panel     Component Value Date/Time   CHOL 208 (H) 05/22/2018 1636   TRIG 255 (H) 05/22/2018 1636   HDL 41 05/22/2018 1636   CHOLHDL 5.1 (H) 05/22/2018 1636   VLDL 20.4 06/14/2016 1013   LDLCALC 128 (H) 05/22/2018 1636   Hepatic Function Panel     Component Value Date/Time   PROT 7.3  08/20/2018 1227   ALBUMIN 4.4 08/20/2018 1227   AST 48 (H) 08/20/2018 1227   ALT 65 (H) 08/20/2018 1227   ALKPHOS 36 (L) 08/20/2018 1227   BILITOT 0.6 08/20/2018 1227   BILIDIR 0.1 06/01/2015 1514      Component Value Date/Time   TSH 2.80 05/22/2018 1636   TSH 0.950 08/25/2015 1153   TSH 1.80 06/01/2015 1514    ECG  shows NSR with a rate of 61 BPM INDIRECT CALORIMETER done today shows a VO2 of 297 and a REE of 2068.  His calculated basal metabolic rate is 9563 thus his basal metabolic rate is worse than expected.    ASSESSMENT AND PLAN: Other fatigue - Plan: EKG 12-Lead, Comprehensive metabolic panel, Hemoglobin A1c, Insulin, random, VITAMIN D 25 Hydroxy (Vit-D Deficiency, Fractures), Vitamin B12, Folate  Shortness of breath on exertion  Other congestive heart failure (HCC) - Plan: Comprehensive metabolic panel  Depression screening  At risk for heart disease  Class 2 severe obesity with serious comorbidity and body mass index (BMI) of 36.0 to 36.9 in adult, unspecified obesity type ([place])  PLAN:  Fatigue Duval was informed that his fatigue may be related to obesity, depression or many other causes. Labs will be ordered, and in the meanwhile Lamarkus has agreed to work on diet, exercise and weight loss to help with fatigue. Proper sleep hygiene was discussed including the need for 7-8 hours of quality sleep each night. A sleep study was not ordered based on symptoms and Epworth score.  Dyspnea on exertion Theron's shortness of breath appears to be obesity related and exercise induced. He has agreed to work on weight loss and gradually increase exercise to treat his exercise induced shortness of breath. If Maximillion follows our instructions and loses weight without improvement of his shortness of breath, we will plan to refer to pulmonology. We will monitor this condition regularly. Gordie agrees to this plan.  Congestive Heart Failure Rossi will follow up at [facility] as previously  scheduled. Cabell agrees to follow up with our clinic in 2  weeks.  Cardiovascular risk counselling Soloman was given extended (15 minutes) coronary artery disease prevention counseling today. He is 52 y.o. male and has risk factors for heart disease including obesity and congestive heart failure. We discussed intensive lifestyle modifications today with an emphasis on specific weight loss instructions and strategies. Pt was also informed of the importance of increasing exercise and decreasing saturated fats to help prevent heart disease.  Depression Screen Camille had a negative depression screening. Depression is commonly associated with obesity and often results in emotional eating behaviors. We will monitor this closely and work on CBT to help improve the non-hunger eating patterns. Referral to Psychology may be required if no improvement is seen as he continues in our clinic.  Obesity Vang is currently in the action stage of change and his goal is to continue with weight loss efforts. I recommend Iva begin the structured treatment plan as follows:  He has agreed to follow the Category 3 plan Olie has been instructed to eventually work up to a goal of 150 minutes of combined cardio and strengthening exercise per week for weight loss and overall health benefits. We discussed the following Behavioral Modification Strategies today: increasing lean protein intake, increasing vegetables, work on meal planning and easy cooking plans, and planning for success   He was informed of the importance of frequent follow up visits to maximize his success with intensive lifestyle modifications for his multiple health conditions. He was informed we would discuss his lab results at his next visit unless there is a critical issue that needs to be addressed sooner. Zlatan agreed to keep his next visit at the agreed upon time to discuss these results.    OBESITY BEHAVIORAL INTERVENTION VISIT  Today's visit was # 1    Starting weight: 238 lbs Starting date: 08/20/18 Today's weight : 238 lbs Today's date: 08/20/2018 Total lbs lost to date: 0    ASK: We discussed the diagnosis of obesity with Jacquelyne Balint today and Alfred Martin agreed to give [place] permission to discuss obesity behavioral modification therapy today.  ASSESS: Shelton has the diagnosis of obesity and his BMI today is 36.2 Rameses is in the action stage of change   ADVISE: Demone was educated on the multiple health risks of obesity as well as the benefit of weight loss to improve his health. He was advised of the need for long term treatment and the importance of lifestyle modifications to improve his current health and to decrease his risk of future health problems.  AGREE: Multiple dietary modification options and treatment options were discussed and  Boubacar agreed to follow the recommendations documented in the above note.  ARRANGE: Xavior was educated on the importance of frequent visits to treat obesity as outlined per CMS and USPSTF guidelines and agreed to schedule his next follow up appointment today.  I, Trixie Dredge, am acting as transcriptionist for Ilene Qua, MD  I have reviewed the above documentation for accuracy and completeness, and I agree with the above. - Ilene Qua, MD

## 2018-09-02 ENCOUNTER — Ambulatory Visit (INDEPENDENT_AMBULATORY_CARE_PROVIDER_SITE_OTHER): Payer: BC Managed Care – PPO | Admitting: Family Medicine

## 2018-09-02 ENCOUNTER — Encounter (INDEPENDENT_AMBULATORY_CARE_PROVIDER_SITE_OTHER): Payer: Self-pay | Admitting: Family Medicine

## 2018-09-02 VITALS — BP 127/79 | HR 61 | Temp 98.3°F | Ht 68.0 in | Wt 238.0 lb

## 2018-09-02 DIAGNOSIS — R7989 Other specified abnormal findings of blood chemistry: Secondary | ICD-10-CM

## 2018-09-02 DIAGNOSIS — R7303 Prediabetes: Secondary | ICD-10-CM

## 2018-09-02 DIAGNOSIS — E559 Vitamin D deficiency, unspecified: Secondary | ICD-10-CM

## 2018-09-02 DIAGNOSIS — Z6836 Body mass index (BMI) 36.0-36.9, adult: Secondary | ICD-10-CM

## 2018-09-02 DIAGNOSIS — Z9189 Other specified personal risk factors, not elsewhere classified: Secondary | ICD-10-CM

## 2018-09-02 DIAGNOSIS — R945 Abnormal results of liver function studies: Secondary | ICD-10-CM | POA: Diagnosis not present

## 2018-09-02 MED ORDER — METFORMIN HCL 500 MG PO TABS: 500.0000 mg | ORAL_TABLET | Freq: Every day | ORAL | 0 refills | Status: DC

## 2018-09-02 MED ORDER — VITAMIN D (ERGOCALCIFEROL) 1.25 MG (50000 UNIT) PO CAPS: 50000.0000 [IU] | ORAL_CAPSULE | ORAL | 0 refills | Status: DC

## 2018-09-03 NOTE — Progress Notes (Signed)
Office: [phone]  /  Fax: [phone]   HPI:   Chief Complaint: OBESITY Brainard is here to discuss his progress with his obesity treatment plan. He is on the Category 3 plan and is following his eating plan approximately 98 % of the time. He states he is walking 4 miles 7 times per week. Eldrige notes he is hungry between lunch and dinner the last few days and hungry between dinner and bedtime.  His weight is 238 lb (108 kg) today and has not lost weight since his last visit. He has lost 0 lbs since starting treatment with [place].  Vitamin D Deficiency Brach has a diagnosis of vitamin D deficiency. He is not on OTC Vit D, he notes fatigue and denies nausea, vomiting or muscle weakness.  Elevated LFTs Alder has a diagnosis of elevated ALT. He has no Hep C test on record and has had a history of elevated LFTs for years. He denies abdominal pain or jaundice and has never been told of any liver problems in the past. He denies excessive alcohol intake.  Pre-Diabetes Burlie has a diagnosis of pre-diabetes based on his elevated Hgb A1c and was informed this puts him at greater risk of developing diabetes. He has a strong family history of diabetes, no previous Hgb A1c on file. He is not taking metformin currently and continues to work on diet and exercise to decrease risk of diabetes. He denies nausea or hypoglycemia.  At risk for diabetes Colton is at higher than average risk for developing diabetes due to his obesity and pre-diabetes. He currently denies polyuria or polydipsia.  ALLERGIES: No Known Allergies  MEDICATIONS: Current Outpatient Medications on File Prior to Visit  Medication Sig Dispense Refill  . allopurinol (ZYLOPRIM) 300 MG tablet Take 1 tablet (300 mg total) by mouth daily. 90 tablet 2  . amLODipine-benazepril (LOTREL) 10-40 MG capsule TAKE 1 CAPSULE BY MOUTH EVERY DAY 90 capsule 1  . fluticasone (FLONASE) 50 MCG/ACT nasal spray SPRAY 2 SPRAYS INTO EACH NOSTRIL EVERY DAY 48 g 1  .  hydrochlorothiazide (HYDRODIURIL) 25 MG tablet TAKE 1 TABLET BY MOUTH EVERY DAY 90 tablet 1  . metoprolol succinate (TOPROL-XL) 25 MG 24 hr tablet Take 1 tablet (25 mg total) by mouth daily. 90 tablet 1   No current facility-administered medications on file prior to visit.     PAST MEDICAL HISTORY: Past Medical History:  Diagnosis Date  . Allergy   . CHF (congestive heart failure) ([place])   . Gout   . HTN (hypertension)   . Hyperlipidemia    diet controlled and exercise  . Joint pain     PAST SURGICAL HISTORY: Past Surgical History:  Procedure Laterality Date  . TONSILLECTOMY      SOCIAL HISTORY: Social History   Tobacco Use  . Smoking status: Never Smoker  . Smokeless tobacco: Never Used  Substance Use Topics  . Alcohol use: No    Alcohol/week: 0.0 standard drinks  . Drug use: No    FAMILY HISTORY: Family History  Problem Relation Age of Onset  . Arthritis Mother        Severe-- RA  . Diabetes Mother   . Hyperlipidemia Mother   . High blood pressure Mother   . Hypertension Mother   . Obesity Mother   . Colon cancer Paternal Uncle   . Congestive Heart Failure Paternal Uncle   . Stroke Paternal Uncle   . Diabetes Father   . Hyperlipidemia Father   . High  blood pressure Father   . Hypertension Father   . Liver disease Father   . Diabetes Brother   . Hypertension Brother   . HIV/AIDS Brother   . Breast cancer Maternal Aunt   . Arthritis Maternal Aunt        RA  . Hypertension Brother   . Drug abuse Brother        chf-- crack / cocaine  . Congestive Heart Failure Brother   . Arthritis Maternal Aunt   . Arthritis Maternal Aunt   . Heart disease Maternal Grandmother   . Breast cancer Paternal Grandmother   . Colon polyps Neg Hx   . Rectal cancer Neg Hx   . Stomach cancer Neg Hx     ROS: Review of Systems  Constitutional: Positive for malaise/fatigue. Negative for weight loss.  Eyes:       Negative jaundice  Gastrointestinal: Negative for abdominal  pain, nausea and vomiting.  Genitourinary: Negative for frequency.  Musculoskeletal:       Negative muscle weakness  Endo/Heme/Allergies: Negative for polydipsia.       Negative hypoglycemia    PHYSICAL EXAM: Blood pressure 127/79, pulse 61, temperature 98.3 F (36.8 C), temperature source Oral, height 5\' 8"  (1.727 m), weight 238 lb (108 kg), SpO2 99 %. Body mass index is 36.19 kg/m. Physical Exam  Constitutional: He is oriented to person, place, and time. He appears well-developed and well-nourished.  Cardiovascular: Normal rate.  Pulmonary/Chest: Effort normal.  Musculoskeletal: Normal range of motion.  Neurological: He is oriented to person, place, and time.  Skin: Skin is warm and dry.  Psychiatric: He has a normal mood and affect. His behavior is normal.  Vitals reviewed.   RECENT LABS AND TESTS: BMET    Component Value Date/Time   NA 142 08/20/2018 1227   K 4.0 08/20/2018 1227   CL 100 08/20/2018 1227   CO2 25 08/20/2018 1227   GLUCOSE 91 08/20/2018 1227   GLUCOSE 96 07/17/2018 1727   BUN 12 08/20/2018 1227   CREATININE 1.26 08/20/2018 1227   CREATININE 1.41 (H) 05/22/2018 1636   CALCIUM 9.5 08/20/2018 1227   GFRNONAA 65 08/20/2018 1227   GFRAA 75 08/20/2018 1227   Lab Results  Component Value Date   HGBA1C 6.3 (H) 08/20/2018   Lab Results  Component Value Date   INSULIN 26.4 (H) 08/20/2018   CBC    Component Value Date/Time   WBC 5.7 07/17/2018 1727   RBC 5.17 07/17/2018 1727   HGB 14.0 07/17/2018 1727   HCT 41.9 07/17/2018 1727   PLT 212 07/17/2018 1727   MCV 81.0 07/17/2018 1727   MCH 27.1 07/17/2018 1727   MCHC 33.4 07/17/2018 1727   RDW 14.1 07/17/2018 1727   LYMPHSABS 2.4 07/17/2018 1727   MONOABS 0.8 07/17/2018 1727   EOSABS 0.2 07/17/2018 1727   BASOSABS 0.0 07/17/2018 1727   Iron/TIBC/Ferritin/ %Sat No results found for: IRON, TIBC, FERRITIN, IRONPCTSAT Lipid Panel     Component Value Date/Time   CHOL 208 (H) 05/22/2018 1636    TRIG 255 (H) 05/22/2018 1636   HDL 41 05/22/2018 1636   CHOLHDL 5.1 (H) 05/22/2018 1636   VLDL 20.4 06/14/2016 1013   LDLCALC 128 (H) 05/22/2018 1636   Hepatic Function Panel     Component Value Date/Time   PROT 7.3 08/20/2018 1227   ALBUMIN 4.4 08/20/2018 1227   AST 48 (H) 08/20/2018 1227   ALT 65 (H) 08/20/2018 1227   ALKPHOS 36 (L) 08/20/2018 1227  BILITOT 0.6 08/20/2018 1227   BILIDIR 0.1 06/01/2015 1514      Component Value Date/Time   TSH 2.80 05/22/2018 1636   TSH 0.950 08/25/2015 1153   TSH 1.80 06/01/2015 1514  Results for EVELYN, MOCH (MRN [id number]) as of 09/03/2018 10:23  Ref. Range 08/20/2018 12:27  Vitamin D, 25-Hydroxy Latest Ref Range: 30.0 - 100.0 ng/mL 16.9 (L)    ASSESSMENT AND PLAN: Vitamin D deficiency - Plan: Vitamin D, Ergocalciferol, (DRISDOL) 50000 units CAPS capsule  Elevated LFTs  Pre-diabetes - Plan: metFORMIN (GLUCOPHAGE) 500 MG tablet  At risk for diabetes mellitus  Class 2 severe obesity with serious comorbidity and body mass index (BMI) of 36.0 to 36.9 in adult, unspecified obesity type ([place])  PLAN:  Vitamin D Deficiency Ethanael was informed that low vitamin D levels contributes to fatigue and are associated with obesity, breast, and colon cancer. Yamil agrees to start prescription Vit D @50 ,000 IU every week #4 with no refills. He will follow up for routine testing of vitamin D, at least 2-3 times per year. He was informed of the risk of over-replacement of vitamin D and agrees to not increase his dose unless he discusses this with [place] first. Kdyn agrees to follow up with our clinic in 2 weeks.  Elevated LFTs We discussed the likely diagnosis of non alcoholic fatty liver disease today and how this condition is obesity related. Damico was educated on his risk of developing NASH or even liver failure and th only proven treatment for NAFLD was weight loss. Aravind agreed to continue with his weight loss efforts with healthier diet and exercise as an  essential part of his treatment plan. We will send a referral for an right upper quadrant ultrasound, and Grae is to discuss Hep C test with his primary care physician. Dionisios agrees to follow up with our clinic in 2 weeks.  Pre-Diabetes Stanly will continue to work on weight loss, exercise, and decreasing simple carbohydrates in his diet to help decrease the risk of diabetes. We dicussed metformin including benefits and risks. He was informed that eating too many simple carbohydrates or too many calories at one sitting increases the likelihood of GI side effects. Shyhiem agrees to start metformin 500 mg PO q AM #30 with no refills. Shaquel agrees to follow up with our clinic in 2 weeks as directed to monitor his progress.  Diabetes risk counselling Lincoln was given extended (30 minutes) diabetes prevention counseling today. He is 52 y.o. male and has risk factors for diabetes including obesity and pre-diabetes. We discussed intensive lifestyle modifications today with an emphasis on weight loss as well as increasing exercise and decreasing simple carbohydrates in his diet.  Obesity Kalyb is currently in the action stage of change. As such, his goal is to continue with weight loss efforts He has agreed to follow the Category 4 plan Lula has been instructed to work up to a goal of 150 minutes of combined cardio and strengthening exercise per week for weight loss and overall health benefits. We discussed the following Behavioral Modification Strategies today: increasing lean protein intake, increasing vegetables, work on meal planning and easy cooking plans, and planning for success   Jamarrion has agreed to follow up with our clinic in 2 weeks. He was informed of the importance of frequent follow up visits to maximize his success with intensive lifestyle modifications for his multiple health conditions.   OBESITY BEHAVIORAL INTERVENTION VISIT  Today's visit was # 2   Starting weight: 238  lbs Starting date:  08/20/18 Today's weight : 238 lbs  Today's date: 09/02/2018 Total lbs lost to date: 0    ASK: We discussed the diagnosis of obesity with Jacquelyne Balint today and Bexley agreed to give [place] permission to discuss obesity behavioral modification therapy today.  ASSESS: Tildon has the diagnosis of obesity and his BMI today is 36.2 Hayze is in the action stage of change   ADVISE: Herold was educated on the multiple health risks of obesity as well as the benefit of weight loss to improve his health. He was advised of the need for long term treatment and the importance of lifestyle modifications to improve his current health and to decrease his risk of future health problems.  AGREE: Multiple dietary modification options and treatment options were discussed and  Gehrig agreed to follow the recommendations documented in the above note.  ARRANGE: Yasseen was educated on the importance of frequent visits to treat obesity as outlined per CMS and USPSTF guidelines and agreed to schedule his next follow up appointment today.  I, Trixie Dredge, am acting as transcriptionist for Ilene Qua, MD  I have reviewed the above documentation for accuracy and completeness, and I agree with the above. - Ilene Qua, MD

## 2018-09-10 IMAGING — DX DG ELBOW COMPLETE 3+V*L*
4 series · 4 of 4 positions shown · non-contrast
Comparison: None.

CLINICAL DATA: Acute onset posterior left elbow swelling and pain
x2 days with fever. History of gout.

EXAM:
LEFT ELBOW - COMPLETE 3+ VIEW

[elbow ap]
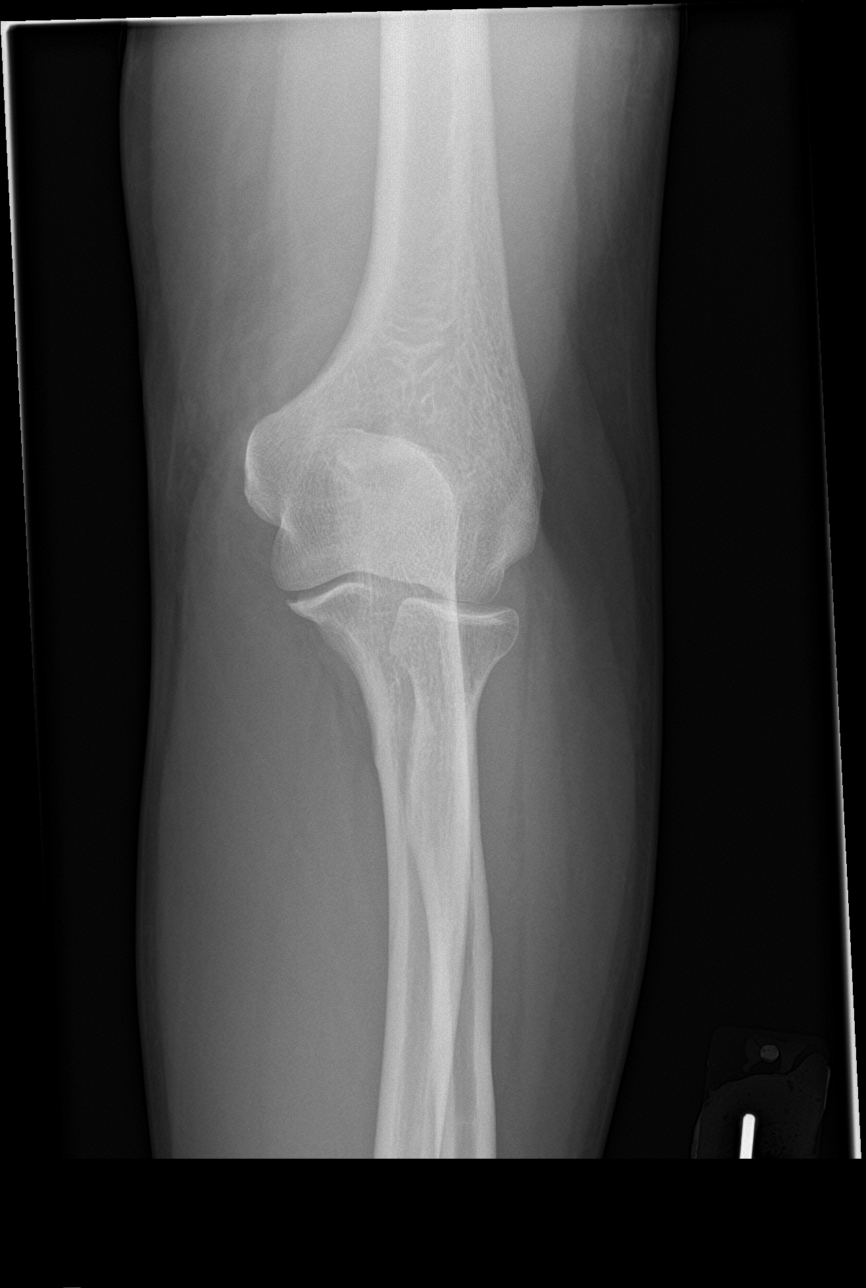

[elbow obl (1 of 2)]
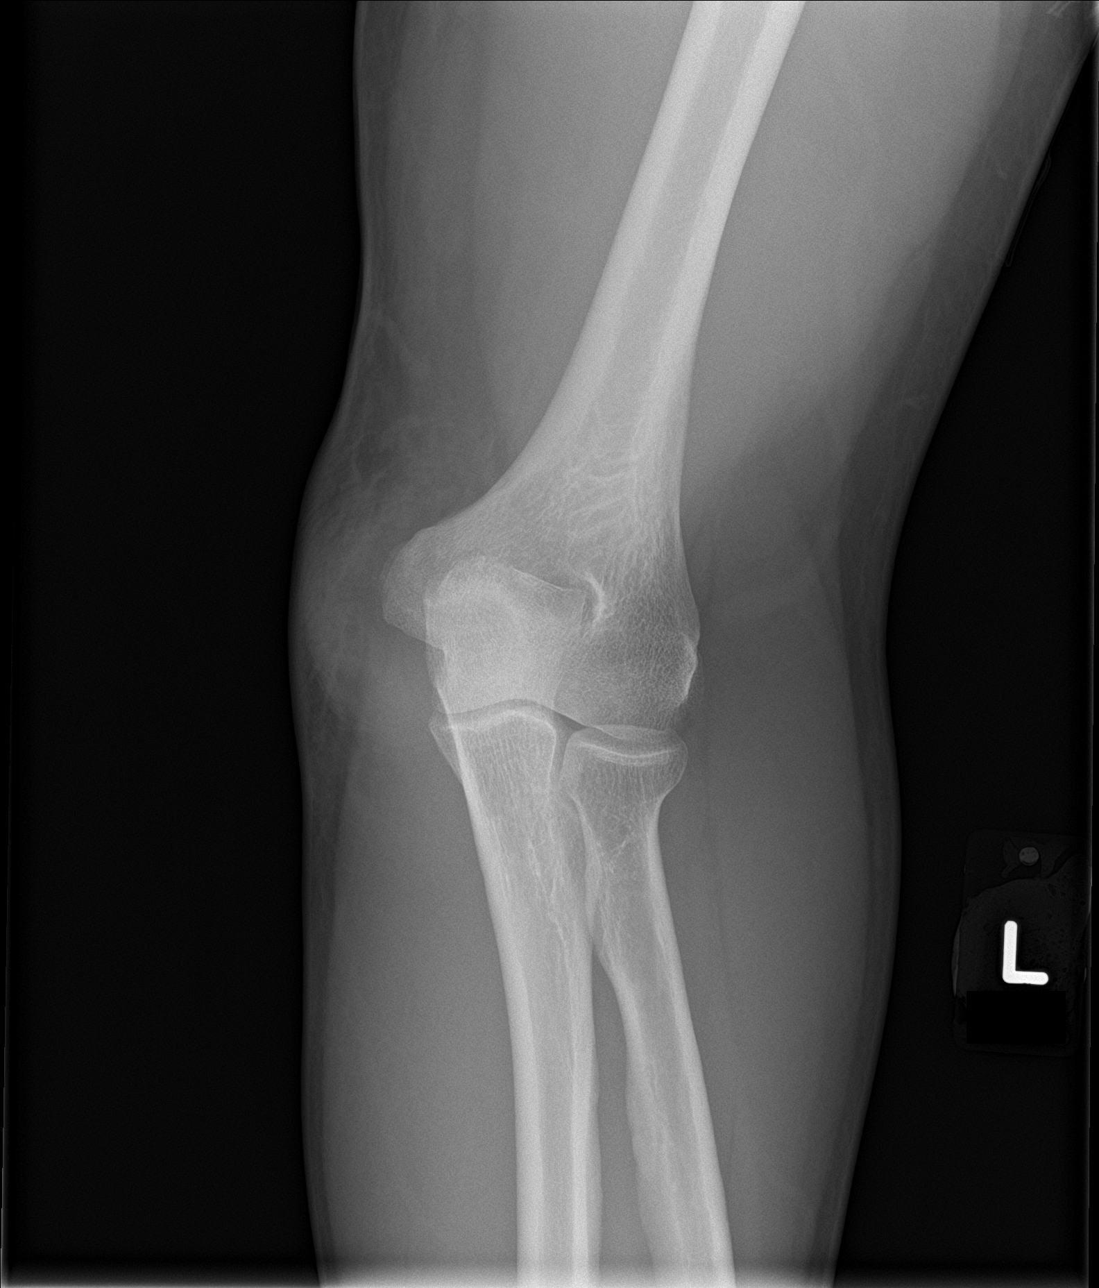

[elbow obl (2 of 2)]
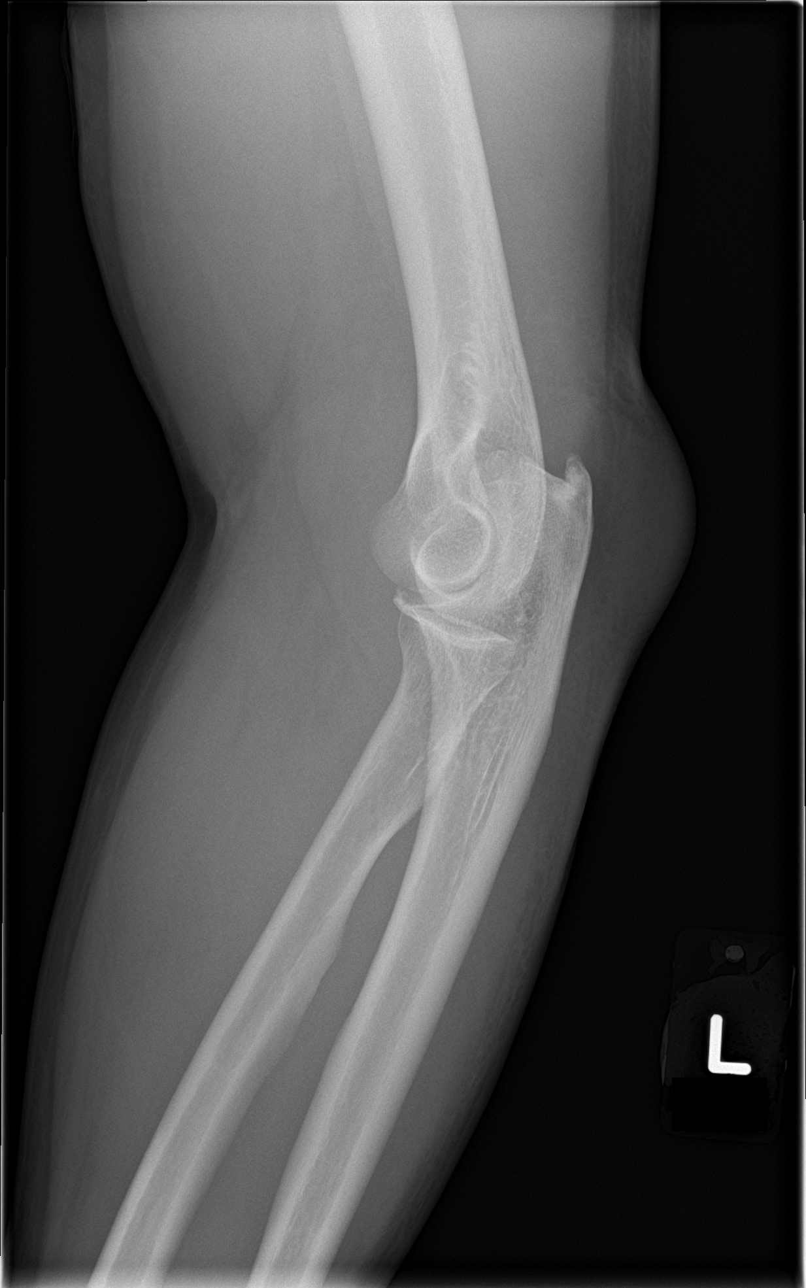

[elbow lat]
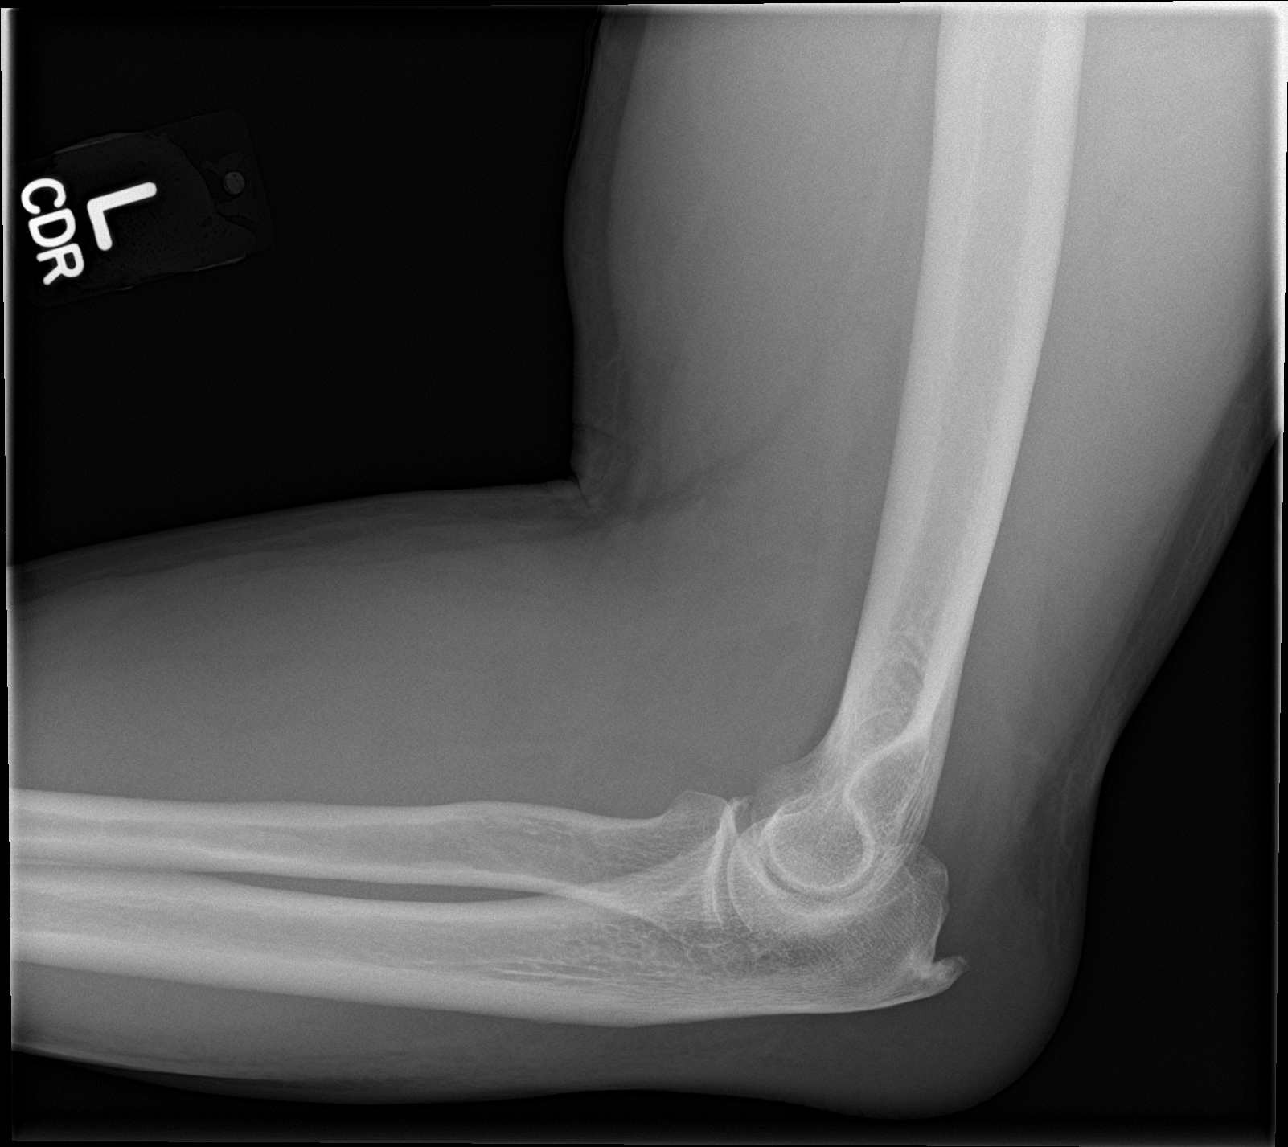

[4 of 4 positions shown; findings below may reference images not displayed]

FINDINGS: Soft tissue swelling in the region of the olecranon bursa is noted.
Enthesopathy at the triceps insertion is identified of the
olecranon. No fracture, joint effusion or bone destruction.
IMPRESSION: Soft tissue swelling posterior to the elbow joint compatible with
olecranon bursitis. Given history of gout, conceivably a large
tophus might account for this but given lack of erosive change
believed less likely.

## 2018-09-21 ENCOUNTER — Ambulatory Visit (INDEPENDENT_AMBULATORY_CARE_PROVIDER_SITE_OTHER): Payer: BC Managed Care – PPO | Admitting: Family Medicine

## 2018-09-21 VITALS — BP 129/78 | HR 64 | Temp 98.1°F | Ht 68.0 in | Wt 234.0 lb

## 2018-09-21 DIAGNOSIS — Z6835 Body mass index (BMI) 35.0-35.9, adult: Secondary | ICD-10-CM

## 2018-09-21 DIAGNOSIS — R7303 Prediabetes: Secondary | ICD-10-CM | POA: Diagnosis not present

## 2018-09-21 DIAGNOSIS — R7989 Other specified abnormal findings of blood chemistry: Secondary | ICD-10-CM

## 2018-09-21 DIAGNOSIS — I5023 Acute on chronic systolic (congestive) heart failure: Secondary | ICD-10-CM | POA: Diagnosis not present

## 2018-09-21 DIAGNOSIS — R945 Abnormal results of liver function studies: Secondary | ICD-10-CM

## 2018-09-24 NOTE — Progress Notes (Signed)
Office: [phone]  /  Fax: [phone]   HPI:   Chief Complaint: OBESITY Alfred Martin is here to discuss his progress with his obesity treatment plan. He is on the Category 4 plan and is following his eating plan approximately 97 % of the time. He states he is exercising 0 minutes 0 times per week. Alfred Martin is currently struggling with having to force himself to eat sometimes. Finding that dinner is difficult as he may go out. He thinks he underate calories at that point.  His weight is 234 lb (106.1 kg) today and has had a weight loss of 4 pounds over a period of 3 weeks since his last visit. He has lost 4 lbs since starting treatment with [place].  Reduced Ejection Fraction due to CHF Alfred Martin has reduced ejection fraction due to CHF. He denies dizziness, orthopnea, and edema.   Pre-Diabetes Alfred Martin has a diagnosis of prediabetes based on his elevated HgA1c and was informed this puts him at greater risk of developing diabetes. He is taking metformin currently and continues to work on diet and exercise to decrease risk of diabetes. He denies nausea or hypoglycemia. He admits to carbohydrate cravings for candy corn.   Elevated LFT Alfred Martin has elevated LFTs. He has not scheduled liver US yet.   ALLERGIES: No Known Allergies  MEDICATIONS: Current Outpatient Medications on File Prior to Visit  Medication Sig Dispense Refill  . allopurinol (ZYLOPRIM) 300 MG tablet Take 1 tablet (300 mg total) by mouth daily. 90 tablet 2  . amLODipine-benazepril (LOTREL) 10-40 MG capsule TAKE 1 CAPSULE BY MOUTH EVERY DAY 90 capsule 1  . fluticasone (FLONASE) 50 MCG/ACT nasal spray SPRAY 2 SPRAYS INTO EACH NOSTRIL EVERY DAY 48 g 1  . hydrochlorothiazide (HYDRODIURIL) 25 MG tablet TAKE 1 TABLET BY MOUTH EVERY DAY 90 tablet 1  . metFORMIN (GLUCOPHAGE) 500 MG tablet Take 1 tablet (500 mg total) by mouth daily with breakfast. 30 tablet 0  . metoprolol succinate (TOPROL-XL) 25 MG 24 hr tablet Take 1 tablet (25 mg total) by mouth  daily. 90 tablet 1  . Vitamin D, Ergocalciferol, (DRISDOL) 50000 units CAPS capsule Take 1 capsule (50,000 Units total) by mouth every 7 (seven) days. 4 capsule 0   No current facility-administered medications on file prior to visit.     PAST MEDICAL HISTORY: Past Medical History:  Diagnosis Date  . Allergy   . CHF (congestive heart failure) ([place])   . Gout   . HTN (hypertension)   . Hyperlipidemia    diet controlled and exercise  . Joint pain     PAST SURGICAL HISTORY: Past Surgical History:  Procedure Laterality Date  . TONSILLECTOMY      SOCIAL HISTORY: Social History   Tobacco Use  . Smoking status: Never Smoker  . Smokeless tobacco: Never Used  Substance Use Topics  . Alcohol use: No    Alcohol/week: 0.0 standard drinks  . Drug use: No    FAMILY HISTORY: Family History  Problem Relation Age of Onset  . Arthritis Mother        Severe-- RA  . Diabetes Mother   . Hyperlipidemia Mother   . High blood pressure Mother   . Hypertension Mother   . Obesity Mother   . Colon cancer Paternal Uncle   . Congestive Heart Failure Paternal Uncle   . Stroke Paternal Uncle   . Diabetes Father   . Hyperlipidemia Father   . High blood pressure Father   . Hypertension Father   .  Liver disease Father   . Diabetes Brother   . Hypertension Brother   . HIV/AIDS Brother   . Breast cancer Maternal Aunt   . Arthritis Maternal Aunt        RA  . Hypertension Brother   . Drug abuse Brother        chf-- crack / cocaine  . Congestive Heart Failure Brother   . Arthritis Maternal Aunt   . Arthritis Maternal Aunt   . Heart disease Maternal Grandmother   . Breast cancer Paternal Grandmother   . Colon polyps Neg Hx   . Rectal cancer Neg Hx   . Stomach cancer Neg Hx     ROS: Review of Systems  Constitutional: Positive for weight loss.  Gastrointestinal: Negative for nausea.  Endo/Heme/Allergies:       Negative for hypoglycemia    PHYSICAL EXAM: Blood pressure 129/78,  pulse 64, temperature 98.1 F (36.7 C), temperature source Oral, height 5\' 8"  (1.727 m), weight 234 lb (106.1 kg), SpO2 98 %. Body mass index is 35.58 kg/m. Physical Exam  Constitutional: He is oriented to person, place, and time. He appears well-developed and well-nourished.  HENT:  Head: Normocephalic.  Neck: Normal range of motion.  Cardiovascular: Normal rate.  Pulmonary/Chest: Effort normal.  Musculoskeletal: Normal range of motion.  Neurological: He is alert and oriented to person, place, and time.  Skin: Skin is warm and dry.  Psychiatric: He has a normal mood and affect. His behavior is normal.  Vitals reviewed.   RECENT LABS AND TESTS: BMET    Component Value Date/Time   NA 142 08/20/2018 1227   K 4.0 08/20/2018 1227   CL 100 08/20/2018 1227   CO2 25 08/20/2018 1227   GLUCOSE 91 08/20/2018 1227   GLUCOSE 96 07/17/2018 1727   BUN 12 08/20/2018 1227   CREATININE 1.26 08/20/2018 1227   CREATININE 1.41 (H) 05/22/2018 1636   CALCIUM 9.5 08/20/2018 1227   GFRNONAA 65 08/20/2018 1227   GFRAA 75 08/20/2018 1227   Lab Results  Component Value Date   HGBA1C 6.3 (H) 08/20/2018   Lab Results  Component Value Date   INSULIN 26.4 (H) 08/20/2018   CBC    Component Value Date/Time   WBC 5.7 07/17/2018 1727   RBC 5.17 07/17/2018 1727   HGB 14.0 07/17/2018 1727   HCT 41.9 07/17/2018 1727   PLT 212 07/17/2018 1727   MCV 81.0 07/17/2018 1727   MCH 27.1 07/17/2018 1727   MCHC 33.4 07/17/2018 1727   RDW 14.1 07/17/2018 1727   LYMPHSABS 2.4 07/17/2018 1727   MONOABS 0.8 07/17/2018 1727   EOSABS 0.2 07/17/2018 1727   BASOSABS 0.0 07/17/2018 1727   Iron/TIBC/Ferritin/ %Sat No results found for: IRON, TIBC, FERRITIN, IRONPCTSAT Lipid Panel     Component Value Date/Time   CHOL 208 (H) 05/22/2018 1636   TRIG 255 (H) 05/22/2018 1636   HDL 41 05/22/2018 1636   CHOLHDL 5.1 (H) 05/22/2018 1636   VLDL 20.4 06/14/2016 1013   LDLCALC 128 (H) 05/22/2018 1636   Hepatic  Function Panel     Component Value Date/Time   PROT 7.3 08/20/2018 1227   ALBUMIN 4.4 08/20/2018 1227   AST 48 (H) 08/20/2018 1227   ALT 65 (H) 08/20/2018 1227   ALKPHOS 36 (L) 08/20/2018 1227   BILITOT 0.6 08/20/2018 1227   BILIDIR 0.1 06/01/2015 1514      Component Value Date/Time   TSH 2.80 05/22/2018 1636   TSH 0.950 08/25/2015 1153  TSH 1.80 06/01/2015 1514    ASSESSMENT AND PLAN: Prediabetes  Elevated LFTs  Reduced ejection fraction concurrent with and due to acute on chronic heart failure (HCC)  Class 2 severe obesity with serious comorbidity and body mass index (BMI) of 35.0 to 35.9 in adult, unspecified obesity type ([place])  PLAN: Reduced Ejection Fraction due to CHF Alfred Martin was educated on reduced ejection fraction due to CHF. He agrees to continue current CHF medications. Agrees to follow up with our clinic as directed.   Pre-Diabetes Alfred Martin will continue to work on weight loss, exercise, and decreasing simple carbohydrates in his diet to help decrease the risk of diabetes. We dicussed metformin including benefits and risks. He was informed that eating too many simple carbohydrates or too many calories at one sitting increases the likelihood of GI side effects. Alfred Martin agrees to continue Metformin as prescribed.  Alfred Martin agreed to follow up with [place] as directed to monitor his progress.  Elevated LFT Alfred Martin was educated on elevated LFT. He agrees to schedule Liver US at earliest convenience. Agrees to follow up with our clinic as directed.   I spent > than 50% of the 15 minute visit on counseling as documented in the note.  Obesity Alfred Martin is currently in the action stage of change. As such, his goal is to continue with weight loss efforts He has agreed to keep a food journal with 500-700 calories and 45+g of protein and Category 4. Alfred Martin has been instructed to work up to a goal of 150 minutes of combined cardio and strengthening exercise per week for weight loss and overall health  benefits. We discussed the following Behavioral Modification Strategies today: increasing lean protein intake, increasing vegetables, planning for success, and work on meal planning and easy cooking plans   Alfred Martin has agreed to follow up with our clinic in 2 weeks. He was informed of the importance of frequent follow up visits to maximize his success with intensive lifestyle modifications for his multiple health conditions.   OBESITY BEHAVIORAL INTERVENTION VISIT  Today's visit was # 3   Starting weight: 238 lb Starting date: 02/17/18 Today's weight :234 lb Today's date: 09/21/18 Total lbs lost to date: 4 lb   ASK: We discussed the diagnosis of obesity with Alfred Martin today and Alfred Martin agreed to give [place] permission to discuss obesity behavioral modification therapy today.  ASSESS: Alfred Martin has the diagnosis of obesity and his BMI today is 35.59 Alfred Martin is in the action stage of change   ADVISE: Alfred Martin was educated on the multiple health risks of obesity as well as the benefit of weight loss to improve his health. He was advised of the need for long term treatment and the importance of lifestyle modifications to improve his current health and to decrease his risk of future health problems.  AGREE: Multiple dietary modification options and treatment options were discussed and  Alfred Martin agreed to follow the recommendations documented in the above note.  ARRANGE: Alfred Martin was educated on the importance of frequent visits to treat obesity as outlined per CMS and USPSTF guidelines and agreed to schedule his next follow up appointment today.  I, Renee Ramus, am acting as transcriptionist for Ilene Qua, MD   I have reviewed the above documentation for accuracy and completeness, and I agree with the above. - Ilene Qua, MD

## 2018-10-05 ENCOUNTER — Encounter (INDEPENDENT_AMBULATORY_CARE_PROVIDER_SITE_OTHER): Payer: Self-pay

## 2018-10-05 ENCOUNTER — Ambulatory Visit (INDEPENDENT_AMBULATORY_CARE_PROVIDER_SITE_OTHER): Payer: BC Managed Care – PPO | Admitting: Family Medicine

## 2018-10-12 ENCOUNTER — Other Ambulatory Visit: Payer: Self-pay | Admitting: Family Medicine

## 2018-12-18 ENCOUNTER — Other Ambulatory Visit: Payer: Self-pay | Admitting: Family Medicine

## 2018-12-18 DIAGNOSIS — I10 Essential (primary) hypertension: Secondary | ICD-10-CM

## 2019-02-08 ENCOUNTER — Other Ambulatory Visit: Payer: Self-pay | Admitting: Family Medicine

## 2019-02-08 DIAGNOSIS — I10 Essential (primary) hypertension: Secondary | ICD-10-CM

## 2019-02-09 ENCOUNTER — Other Ambulatory Visit: Payer: Self-pay | Admitting: *Deleted

## 2019-02-09 DIAGNOSIS — J0140 Acute pansinusitis, unspecified: Secondary | ICD-10-CM

## 2019-02-09 MED ORDER — FLUTICASONE PROPIONATE 50 MCG/ACT NA SUSP: 2.0000 | Freq: Every day | NASAL | 0 refills | Status: DC

## 2019-02-09 MED ORDER — LEVOCETIRIZINE DIHYDROCHLORIDE 5 MG PO TABS: ORAL_TABLET | ORAL | 0 refills | Status: DC

## 2019-02-09 NOTE — Addendum Note (Signed)
Addended by: Kem Boroughs D on: 02/09/2019 06:14 PM   Modules accepted: Orders

## 2019-03-16 ENCOUNTER — Telehealth: Payer: BC Managed Care – PPO | Admitting: Nurse Practitioner

## 2019-03-16 DIAGNOSIS — L819 Disorder of pigmentation, unspecified: Secondary | ICD-10-CM

## 2019-03-16 NOTE — Progress Notes (Signed)
Based on what you shared with me it looks like you have a chroni issue,that should be evaluated in a face to face office visit. We do not fill chronic medications in and evisit. You will also need to see your PCP for blood work in order to check your kidney function    NOTE: If you entered your credit card information for this eVisit, you will not be charged. You may see a "hold" on your card for the $30 but that hold will drop off and you will not have a charge processed.  If you are having a true medical emergency please call 911.  If you need an urgent face to face visit, [facility] has four urgent care centers for your convenience.  If you need care fast and have a high deductible or no insurance consider:   [email or web address] to reserve your spot online an avoid wait times  [facility] [address] [place], [place] [address] 8 am to 8 pm Monday-Friday 10 am to 4 pm Saturday-Sunday *Across the street from [employer]  [place], [address] 8 am to 5 pm Monday-Friday * In the [facility] on the [facility]   The following sites will take your  insurance:  . [facility] [place] a Provider at this Location  [address] [place], [place] [address] . 10 am to 8 pm Monday-Friday . 12 pm to 8 pm Saturday-Sunday   . [facility] [place] a Provider at this Location  [address], [place], [place] [address] . 8 am to 8 pm Monday-Friday . 9 am to 6 pm Saturday . 11 am to 6 pm Sunday   . [facility] at [place] Get Driving Directions  [address] [place], [place] [address] . 8 am to 8 pm Monday-Friday . 8 am to 4 pm Saturday-Sunday   Your e-visit answers were reviewed by a board certified advanced  clinical practitioner to complete your personal care plan.

## 2019-03-16 NOTE — Progress Notes (Deleted)
We are sorry that you are experiencing this issue.  Here is how we plan to help!  Based on what you shared with me it looks like you have uncomplicated acne.  Acne is a disorder of the hair follicles and oil glands (sebaceous glands). The sebaceous glands secrete oils to keep the skin moist.  When the glands get clogged, it can lead to pimples or cysts.  These cysts may become infected and leave scars. Acne is very common and normally occurs at puberty.  Acne is also inherited.  Your personal care plan consists of the following recommendations:  I recommend that you use a daily cleanser   I have prescribed a topical gel with an antibiotic:   I have also prescribed one of the following additional therapies:   If excessive dryness or peeling occurs, reduce dose frequency or concentration of the topical scrubs.  If excessive stinging or burning occurs, remove the topical gel with mild soap and water and resume at a lower dose the next day.  Remember oral antibiotics and topical acne treatments may increase your sensitivity to the sun!  HOME CARE:  Do not squeeze pimples because that can often lead to infections, worse acne, and scars.  Use a moisturizer that contains retinoid or fruit acids that may inhibit the development of new acne lesions.  Although there is not a clear link that foods can cause acne, doctors do believe that too many sweets predispose you to skin problems.  GET HELP RIGHT AWAY IF:  If your acne gets worse or is not better within 10 days.  If you become depressed.  If you become pregnant, discontinue medications and call your OB/GYN.  MAKE SURE YOU:  Understand these instructions.  Will watch your condition.  Will get help right away if you are not doing well or get worse.   Your e-visit answers were reviewed by a board certified advanced clinical practitioner to complete your personal care plan.  Depending upon the condition, your plan could have included  both over the counter or prescription medications.  Please review your pharmacy choice.  If there is a problem, you may contact your provider through [employer] and have the prescription routed to another pharmacy.  Your safety is important to [place].  If you have drug allergies check your prescription carefully.  For the next 24 hours you can use MyChart to ask questions about today's visit, request a non-urgent call back, or ask for a work or school excuse from your e-visit provider.  You will get an email in the next two days asking about your experience. I hope that your e-visit has been valuable and will speed your recovery.

## 2019-03-18 ENCOUNTER — Other Ambulatory Visit: Payer: BC Managed Care – PPO

## 2019-03-18 ENCOUNTER — Encounter: Payer: Self-pay | Admitting: Family Medicine

## 2019-03-18 ENCOUNTER — Other Ambulatory Visit: Payer: Self-pay

## 2019-03-18 ENCOUNTER — Telehealth: Payer: Self-pay | Admitting: Family Medicine

## 2019-03-18 ENCOUNTER — Ambulatory Visit (INDEPENDENT_AMBULATORY_CARE_PROVIDER_SITE_OTHER): Payer: BC Managed Care – PPO | Admitting: Family Medicine

## 2019-03-18 DIAGNOSIS — E1169 Type 2 diabetes mellitus with other specified complication: Secondary | ICD-10-CM | POA: Diagnosis not present

## 2019-03-18 DIAGNOSIS — E1165 Type 2 diabetes mellitus with hyperglycemia: Secondary | ICD-10-CM

## 2019-03-18 DIAGNOSIS — E785 Hyperlipidemia, unspecified: Secondary | ICD-10-CM

## 2019-03-18 DIAGNOSIS — I10 Essential (primary) hypertension: Secondary | ICD-10-CM

## 2019-03-18 DIAGNOSIS — L819 Disorder of pigmentation, unspecified: Secondary | ICD-10-CM | POA: Diagnosis not present

## 2019-03-18 MED ORDER — HYDROQUINONE 4 % EX CREA: TOPICAL_CREAM | Freq: Two times a day (BID) | CUTANEOUS | 0 refills | Status: DC

## 2019-03-18 NOTE — Telephone Encounter (Signed)
Needs labs-- orders in

## 2019-03-18 NOTE — Progress Notes (Signed)
Virtual Visit via Video Note  I connected with Alfred Martin on 03/18/19 at 10:30 AM EDT by a video enabled telemedicine application and verified that I am speaking with the correct person using two identifiers.   I discussed the limitations of evaluation and management by telemedicine and the availability of in person appointments. The patient expressed understanding and agreed to proceed.  History of Present Illness:  Pt is home c/o hyperpigmentation of the skin -- he had hydroquinone but ran out -- he would like a refill  HYPERTENSION   Blood pressure range-good per pt   Chest pain- no      Dyspnea- no Lightheadedness- no   Edema- no  Other side effects - no   Medication compliance: good Low salt diet- yes    DIABETES    Blood Sugar ranges-not checked recently  Polyuria- no New Visual problems- no  Hypoglycemic symptoms- no  Other side effects-no Medication compliance - good   HYPERLIPIDEMIA  Medication compliance- good RUQ pain- no  Muscle aches- no Other side effects-no   ROS See HPI above   PMH Smoking Status noted       Observations/Objective: 128/82  138lbs    5 ft 9 in   Afebrile ,    Skin on face--- + patches of hyperpigmentation esp around mouth  Assessment and Plan: 1. Hyperpigmentation Refill cream for pt Call or rto prn  - hydroquinone 4 % cream; Apply topically 2 (two) times daily.  Dispense: 28.35 g; Refill: 0  2. Type 2 diabetes mellitus with hyperglycemia, without long-term current use of insulin (HCC) hgba1c to be checked  minimize simple carbs. Increase exercise as tolerated. Continue current meds  - Hemoglobin A1c; Future - Microalbumin / creatinine urine ratio; Future  3. Hyperlipidemia associated with type 2 diabetes mellitus ([place]) Encouraged heart healthy diet, increase exercise, avoid trans fats, consider a krill oil cap daily - Lipid panel; Future - Comprehensive metabolic panel; Future  4. Essential hypertension Well controlled, no  changes to meds. Encouraged heart healthy diet such as the DASH diet and exercise as tolerated.  - Comprehensive metabolic panel; Future - Microalbumin / creatinine urine ratio; Future  Follow Up Instructions:    I discussed the assessment and treatment plan with the patient. The patient was provided an opportunity to ask questions and all were answered. The patient agreed with the plan and demonstrated an understanding of the instructions.   The patient was advised to call back or seek an in-person evaluation if the symptoms worsen or if the condition fails to improve as anticipated.    [name], DO

## 2019-03-18 NOTE — Addendum Note (Signed)
Addended by: Kelle Darting A on: 03/18/2019 03:02 PM   Modules accepted: Orders

## 2019-03-18 NOTE — Telephone Encounter (Signed)
Scheduled lab appt for today at 3:15pm

## 2019-03-19 ENCOUNTER — Telehealth: Payer: Self-pay | Admitting: Family Medicine

## 2019-03-19 ENCOUNTER — Other Ambulatory Visit: Payer: Self-pay | Admitting: *Deleted

## 2019-03-19 ENCOUNTER — Other Ambulatory Visit: Payer: Self-pay | Admitting: Family Medicine

## 2019-03-19 DIAGNOSIS — E1169 Type 2 diabetes mellitus with other specified complication: Secondary | ICD-10-CM

## 2019-03-19 DIAGNOSIS — R748 Abnormal levels of other serum enzymes: Secondary | ICD-10-CM

## 2019-03-19 DIAGNOSIS — E785 Hyperlipidemia, unspecified: Secondary | ICD-10-CM

## 2019-03-19 DIAGNOSIS — E1165 Type 2 diabetes mellitus with hyperglycemia: Secondary | ICD-10-CM

## 2019-03-19 LAB — COMPREHENSIVE METABOLIC PANEL
ALT: 54 U/L — ABNORMAL HIGH (ref 0–53)
AST: 43 U/L — ABNORMAL HIGH (ref 0–37)
Albumin: 4.3 g/dL (ref 3.5–5.2)
Alkaline Phosphatase: 34 U/L — ABNORMAL LOW (ref 39–117)
BUN: 22 mg/dL (ref 6–23)
CO2: 30 mEq/L (ref 19–32)
Calcium: 9.7 mg/dL (ref 8.4–10.5)
Chloride: 100 mEq/L (ref 96–112)
Creatinine, Ser: 1.4 mg/dL (ref 0.40–1.50)
GFR: 63.87 mL/min (ref >60.00)
Glucose, Bld: 90 mg/dL (ref 70–99)
Potassium: 4 mEq/L (ref 3.5–5.1)
Sodium: 138 mEq/L (ref 135–145)
Total Bilirubin: 0.5 mg/dL (ref 0.2–1.2)
Total Protein: 7.1 g/dL (ref 6.0–8.3)

## 2019-03-19 LAB — LIPID PANEL
Cholesterol: 204 mg/dL — ABNORMAL HIGH (ref 0–200)
HDL: 44.2 mg/dL (ref >39.00)
NonHDL: 160.03
Total CHOL/HDL Ratio: 5
Triglycerides: 230 mg/dL — ABNORMAL HIGH (ref 0.0–149.0)
VLDL: 46 mg/dL — ABNORMAL HIGH (ref 0.0–40.0)

## 2019-03-19 LAB — MICROALBUMIN / CREATININE URINE RATIO
Creatinine,U: 150.7 mg/dL
Microalb Creat Ratio: 0.5 mg/g (ref 0.0–30.0)
Microalb, Ur: <0.7 mg/dL (ref 0.0–1.9)

## 2019-03-19 LAB — LDL CHOLESTEROL, DIRECT: Direct LDL: 127 mg/dL

## 2019-03-19 LAB — HEMOGLOBIN A1C: Hgb A1c MFr Bld: 6.6 % — ABNORMAL HIGH (ref 4.6–6.5)

## 2019-03-19 NOTE — Telephone Encounter (Signed)
LVM for pt to call and schedule a lab appt from 2-3 wks from now and another lab appt from 3 months out also.

## 2019-03-19 NOTE — Telephone Encounter (Signed)
-----   Message from [name], [place] sent at 03/19/2019 12:23 PM EDT ----- Kidney function is good (bun/ cr/ gfr) Liver function however is not--- ast/alt--- any inc tylenol , alcohol or other over the counter / herbal meds?   Recheck hep , ggt in 2-3 weeks  hgba1c creeping up=--- watch simple sugars and starche Recheck rest of the labs in 3 months

## 2019-03-26 ENCOUNTER — Other Ambulatory Visit: Payer: Self-pay | Admitting: Family Medicine

## 2019-04-06 ENCOUNTER — Other Ambulatory Visit: Payer: Self-pay

## 2019-04-06 ENCOUNTER — Other Ambulatory Visit (INDEPENDENT_AMBULATORY_CARE_PROVIDER_SITE_OTHER): Payer: BC Managed Care – PPO

## 2019-04-06 ENCOUNTER — Other Ambulatory Visit: Payer: BC Managed Care – PPO

## 2019-04-06 DIAGNOSIS — R748 Abnormal levels of other serum enzymes: Secondary | ICD-10-CM | POA: Diagnosis not present

## 2019-04-06 LAB — HEPATIC FUNCTION PANEL
ALT: 52 U/L (ref 0–53)
AST: 38 U/L — ABNORMAL HIGH (ref 0–37)
Albumin: 4.3 g/dL (ref 3.5–5.2)
Alkaline Phosphatase: 35 U/L — ABNORMAL LOW (ref 39–117)
Bilirubin, Direct: 0.1 mg/dL (ref 0.0–0.3)
Total Bilirubin: 0.7 mg/dL (ref 0.2–1.2)
Total Protein: 7 g/dL (ref 6.0–8.3)

## 2019-04-06 LAB — GAMMA GT: GGT: 31 U/L (ref 7–51)

## 2019-05-02 ENCOUNTER — Other Ambulatory Visit: Payer: Self-pay | Admitting: Family Medicine

## 2019-05-02 DIAGNOSIS — J0140 Acute pansinusitis, unspecified: Secondary | ICD-10-CM

## 2019-05-06 ENCOUNTER — Other Ambulatory Visit: Payer: Self-pay | Admitting: Family Medicine

## 2019-05-06 DIAGNOSIS — I10 Essential (primary) hypertension: Secondary | ICD-10-CM

## 2019-05-13 ENCOUNTER — Encounter: Payer: Self-pay | Admitting: Family Medicine

## 2019-05-14 ENCOUNTER — Other Ambulatory Visit: Payer: Self-pay | Admitting: Family Medicine

## 2019-05-14 DIAGNOSIS — N529 Male erectile dysfunction, unspecified: Secondary | ICD-10-CM

## 2019-05-14 MED ORDER — SILDENAFIL CITRATE 100 MG PO TABS: 50.0000 mg | ORAL_TABLET | Freq: Every day | ORAL | 11 refills | Status: DC | PRN

## 2019-05-14 NOTE — Telephone Encounter (Signed)
It was sent in  

## 2019-05-20 ENCOUNTER — Other Ambulatory Visit: Payer: Self-pay | Admitting: Family Medicine

## 2019-05-20 MED ORDER — METOPROLOL SUCCINATE ER 25 MG PO TB24: 25.0000 mg | ORAL_TABLET | Freq: Every day | ORAL | 1 refills | Status: DC

## 2019-05-20 NOTE — Telephone Encounter (Signed)
Medication Refill - Medication:   metoprolol succinate (TOPROL-XL) 25 MG 24 hr tablet [[id number]]    Has the patient contacted their pharmacy? Yes  Preferred Pharmacy: [id number] - [place], [place] [address] AT [phone] (Phone) [phone] (Fax)   Pt states that he sent request via mychart 1 week ago. Please advise

## 2019-05-20 NOTE — Telephone Encounter (Signed)
Rx sent 

## 2019-06-22 ENCOUNTER — Other Ambulatory Visit: Payer: Self-pay

## 2019-06-22 ENCOUNTER — Other Ambulatory Visit (INDEPENDENT_AMBULATORY_CARE_PROVIDER_SITE_OTHER): Payer: BC Managed Care – PPO

## 2019-06-22 DIAGNOSIS — E1169 Type 2 diabetes mellitus with other specified complication: Secondary | ICD-10-CM | POA: Diagnosis not present

## 2019-06-22 DIAGNOSIS — E785 Hyperlipidemia, unspecified: Secondary | ICD-10-CM

## 2019-06-22 DIAGNOSIS — E1165 Type 2 diabetes mellitus with hyperglycemia: Secondary | ICD-10-CM | POA: Diagnosis not present

## 2019-06-22 LAB — COMPREHENSIVE METABOLIC PANEL
ALT: 48 U/L (ref 0–53)
AST: 33 U/L (ref 0–37)
Albumin: 4.3 g/dL (ref 3.5–5.2)
Alkaline Phosphatase: 36 U/L — ABNORMAL LOW (ref 39–117)
BUN: 19 mg/dL (ref 6–23)
CO2: 29 mEq/L (ref 19–32)
Calcium: 9.1 mg/dL (ref 8.4–10.5)
Chloride: 103 mEq/L (ref 96–112)
Creatinine, Ser: 1.25 mg/dL (ref 0.40–1.50)
GFR: 72.72 mL/min (ref >60.00)
Glucose, Bld: 104 mg/dL — ABNORMAL HIGH (ref 70–99)
Potassium: 3.7 mEq/L (ref 3.5–5.1)
Sodium: 140 mEq/L (ref 135–145)
Total Bilirubin: 0.4 mg/dL (ref 0.2–1.2)
Total Protein: 6.8 g/dL (ref 6.0–8.3)

## 2019-06-22 LAB — LIPID PANEL
Cholesterol: 187 mg/dL (ref 0–200)
HDL: 43.6 mg/dL (ref >39.00)
LDL Cholesterol: 113 mg/dL — ABNORMAL HIGH (ref 0–99)
NonHDL: 142.9
Total CHOL/HDL Ratio: 4
Triglycerides: 151 mg/dL — ABNORMAL HIGH (ref 0.0–149.0)
VLDL: 30.2 mg/dL (ref 0.0–40.0)

## 2019-06-22 LAB — HEMOGLOBIN A1C: Hgb A1c MFr Bld: 6.5 % (ref 4.6–6.5)

## 2019-06-27 ENCOUNTER — Other Ambulatory Visit: Payer: Self-pay | Admitting: Family Medicine

## 2019-06-27 ENCOUNTER — Encounter: Payer: Self-pay | Admitting: Family Medicine

## 2019-06-27 DIAGNOSIS — E785 Hyperlipidemia, unspecified: Secondary | ICD-10-CM

## 2019-06-28 ENCOUNTER — Other Ambulatory Visit: Payer: Self-pay

## 2019-06-28 MED ORDER — ROSUVASTATIN CALCIUM 10 MG PO TABS: 10.0000 mg | ORAL_TABLET | Freq: Every day | ORAL | 2 refills | Status: DC

## 2019-07-11 ENCOUNTER — Other Ambulatory Visit: Payer: Self-pay | Admitting: Family Medicine

## 2019-07-11 DIAGNOSIS — L819 Disorder of pigmentation, unspecified: Secondary | ICD-10-CM

## 2019-09-04 ENCOUNTER — Encounter: Payer: Self-pay | Admitting: Family Medicine

## 2019-09-04 ENCOUNTER — Other Ambulatory Visit: Payer: Self-pay | Admitting: Family Medicine

## 2019-09-04 DIAGNOSIS — I10 Essential (primary) hypertension: Secondary | ICD-10-CM

## 2019-09-19 ENCOUNTER — Other Ambulatory Visit: Payer: Self-pay | Admitting: Family Medicine

## 2019-09-19 DIAGNOSIS — J0140 Acute pansinusitis, unspecified: Secondary | ICD-10-CM

## 2019-09-20 ENCOUNTER — Other Ambulatory Visit: Payer: Self-pay

## 2019-09-21 ENCOUNTER — Encounter: Payer: Self-pay | Admitting: Family Medicine

## 2019-09-21 ENCOUNTER — Ambulatory Visit: Payer: BC Managed Care – PPO | Admitting: Family Medicine

## 2019-09-21 VITALS — BP 118/80 | HR 69 | Temp 98.4°F | Resp 18 | Ht 68.0 in | Wt 247.0 lb

## 2019-09-21 DIAGNOSIS — M1A09X0 Idiopathic chronic gout, multiple sites, without tophus (tophi): Secondary | ICD-10-CM

## 2019-09-21 DIAGNOSIS — E1165 Type 2 diabetes mellitus with hyperglycemia: Secondary | ICD-10-CM

## 2019-09-21 DIAGNOSIS — R21 Rash and other nonspecific skin eruption: Secondary | ICD-10-CM

## 2019-09-21 DIAGNOSIS — I10 Essential (primary) hypertension: Secondary | ICD-10-CM | POA: Diagnosis not present

## 2019-09-21 DIAGNOSIS — E785 Hyperlipidemia, unspecified: Secondary | ICD-10-CM

## 2019-09-21 MED ORDER — CLOTRIMAZOLE-BETAMETHASONE 1-0.05 % EX CREA: 1.0000 "application " | TOPICAL_CREAM | Freq: Two times a day (BID) | CUTANEOUS | 0 refills | Status: DC

## 2019-09-21 NOTE — Progress Notes (Signed)
Patient ID: Alfred Martin, male    DOB: Oct 01, 1966  Age: 53 y.o. MRN: [id number]    Subjective:  Subjective  HPI Alfred Martin presents for f/u htn, chol and gout  No complaints except an itchy rash on lat L ankle  Review of Systems  Constitutional: Negative for appetite change, diaphoresis, fatigue and unexpected weight change.  Eyes: Negative for pain, redness and visual disturbance.  Respiratory: Negative for cough, chest tightness, shortness of breath and wheezing.   Cardiovascular: Negative for chest pain, palpitations and leg swelling.  Endocrine: Negative for cold intolerance, heat intolerance, polydipsia, polyphagia and polyuria.  Genitourinary: Negative for difficulty urinating, dysuria and frequency.  Neurological: Negative for dizziness, light-headedness, numbness and headaches.     History Past Medical History:  Diagnosis Date  . Allergy   . CHF (congestive heart failure) ([place])   . Gout   . HTN (hypertension)   . Hyperlipidemia    diet controlled and exercise  . Joint pain     He has a past surgical history that includes Tonsillectomy.   His family history includes Arthritis in his maternal aunt, maternal aunt, maternal aunt, and mother; Breast cancer in his maternal aunt and paternal grandmother; Colon cancer in his paternal uncle; Congestive Heart Failure in his brother and paternal uncle; Diabetes in his brother, father, and mother; Drug abuse in his brother; HIV/AIDS in his brother; Heart disease in his maternal grandmother; High blood pressure in his father and mother; Hyperlipidemia in his father and mother; Hypertension in his brother, brother, father, and mother; Liver disease in his father; Obesity in his mother; Stroke in his paternal uncle.He reports that he has never smoked. He has never used smokeless tobacco. He reports that he does not drink alcohol or use drugs.  Current Outpatient Medications on File Prior to Visit  Medication Sig Dispense Refill  . allopurinol  (ZYLOPRIM) 300 MG tablet TAKE 1 TABLET BY MOUTH EVERY DAY 90 tablet 2  . amLODipine-benazepril (LOTREL) 10-40 MG capsule TAKE 1 CAPSULE BY MOUTH EVERY DAY 90 capsule 1  . fluticasone (FLONASE) 50 MCG/ACT nasal spray SPRAY 2 SPRAYS INTO EACH NOSTRIL EVERY DAY 48 mL 1  . hydrochlorothiazide (HYDRODIURIL) 25 MG tablet TAKE 1 TABLET BY MOUTH EVERY DAY 90 tablet 1  . hydroquinone 4 % cream APPLY TO AFFECTED AREA TWICE A DAY 28.35 g 0  . levocetirizine (XYZAL) 5 MG tablet TAKE 1 TABLET BY MOUTH EVERY DAY IN THE EVENING. 90 tablet 1  . metoprolol succinate (TOPROL-XL) 25 MG 24 hr tablet Take 1 tablet (25 mg total) by mouth daily. 90 tablet 1  . rosuvastatin (CRESTOR) 10 MG tablet Take 1 tablet (10 mg total) by mouth daily. 30 tablet 2  . sildenafil (VIAGRA) 100 MG tablet Take 0.5-1 tablets (50-100 mg total) by mouth daily as needed for erectile dysfunction. 10 tablet 11  . Vitamin D, Ergocalciferol, (DRISDOL) 50000 units CAPS capsule Take 1 capsule (50,000 Units total) by mouth every 7 (seven) days. 4 capsule 0   No current facility-administered medications on file prior to visit.      Objective:  Objective  Physical Exam Vitals signs and nursing note reviewed.  Constitutional:      General: He is sleeping.     Appearance: He is well-developed.  HENT:     Head: Normocephalic and atraumatic.  Eyes:     Pupils: Pupils are equal, round, and reactive to light.  Neck:     Musculoskeletal: Normal range of motion and neck supple.  Thyroid: No thyromegaly.  Cardiovascular:     Rate and Rhythm: Normal rate and regular rhythm.     Heart sounds: No murmur.  Pulmonary:     Effort: Pulmonary effort is normal. No respiratory distress.     Breath sounds: Normal breath sounds. No wheezing or rales.  Chest:     Chest wall: No tenderness.  Musculoskeletal:        General: No tenderness.  Skin:    General: Skin is warm and dry.     Findings: Rash present.       Neurological:     Mental Status:  He is oriented to person, place, and time.  Psychiatric:        Behavior: Behavior normal.        Thought Content: Thought content normal.        Judgment: Judgment normal.    BP 118/80 (BP Location: Right Arm, Patient Position: Sitting, Cuff Size: Large)   Pulse 69   Temp 98.4 F (36.9 C) (Temporal)   Resp 18   Ht 5\' 8"  (1.727 m)   Wt 247 lb (112 kg)   SpO2 98%   BMI 37.56 kg/m  Wt Readings from Last 3 Encounters:  09/21/19 247 lb (112 kg)  09/21/18 234 lb (106.1 kg)  09/02/18 238 lb (108 kg)     Lab Results  Component Value Date   WBC 5.7 07/17/2018   HGB 14.0 07/17/2018   HCT 41.9 07/17/2018   PLT 212 07/17/2018   GLUCOSE 104 (H) 06/22/2019   CHOL 187 06/22/2019   TRIG 151.0 (H) 06/22/2019   HDL 43.60 06/22/2019   LDLDIRECT 127.0 03/18/2019   LDLCALC 113 (H) 06/22/2019   ALT 48 06/22/2019   AST 33 06/22/2019   NA 140 06/22/2019   K 3.7 06/22/2019   CL 103 06/22/2019   CREATININE 1.25 06/22/2019   BUN 19 06/22/2019   CO2 29 06/22/2019   TSH 2.80 05/22/2018   PSA 0.9 05/22/2018   HGBA1C 6.5 06/22/2019   MICROALBUR <0.7 03/18/2019    Ct Elbow Left W Contrast  Result Date: 07/17/2018 CLINICAL DATA:  Left elbow swelling and increased warmth. The patient hit the elbow on a counter edge 10 days ago. He had fluid incised and drained four days ago. History of gout. EXAM: CT OF THE UPPER LEFT EXTREMITY WITH CONTRAST TECHNIQUE: Multidetector CT imaging of the upper left extremity was performed according to the standard protocol following intravenous contrast administration. COMPARISON:  Left elbow radiographs dated 07/11/2018. CONTRAST:  127mL ISOVUE-300 IOPAMIDOL (ISOVUE-300) INJECTION 61% FINDINGS: Mildly irregular fluid collection posterior to the olecranon with mildly thickened and irregular diffuse surrounding enhancing soft tissue. The fluid collection measures 2.6 x 2.0 cm on image number 137 series 4. This measures 3.6 cm in length on sagittal image number 52.  There is edema an the adjacent subcutaneous fat. No calcifications. Moderately large and minimally fragmented olecranon spur. Otherwise, no fracture seen. Minimal coronoid process spur formation. No bone destruction or periosteal reaction. No soft tissue gas. IMPRESSION: 1. 2.6 x 2.0 x 2.0 cm abscess posterior to the olecranon at the location of the olecranon bursa. 2. No evidence of osteomyelitis. 3. Moderately large and minimally fragmented olecranon spur. Electronically Signed   By: Claudie Revering M.D.   On: 07/17/2018 19:47     Assessment & Plan:  Plan  I have discontinued Alfred Martin's metFORMIN. I am also having him start on clotrimazole-betamethasone. Additionally, I am having him maintain his Vitamin D (  Ergocalciferol), allopurinol, levocetirizine, hydrochlorothiazide, sildenafil, metoprolol succinate, rosuvastatin, hydroquinone, amLODipine-benazepril, and fluticasone.  Meds ordered this encounter  Medications  . clotrimazole-betamethasone (LOTRISONE) cream    Sig: Apply 1 application topically 2 (two) times daily.    Dispense:  30 g    Refill:  0    Problem List Items Addressed This Visit      Unprioritized   Gout of multiple sites - Primary    Check labs rx allopurinol       Relevant Orders   Uric acid   HTN (hypertension)    Well controlled, no changes to meds. Encouraged heart healthy diet such as the DASH diet and exercise as tolerated.       Relevant Orders   Comprehensive metabolic panel   Microalbumin / creatinine urine ratio   Hyperlipidemia LDL goal <100    Tolerating statin, encouraged heart healthy diet, avoid trans fats, minimize simple carbs and saturated fats. Increase exercise as tolerated       Other Visit Diagnoses    hyperglycemia       Relevant Orders   Hemoglobin A1c   Comprehensive metabolic panel   Microalbumin / creatinine urine ratio   Hyperlipidemia, unspecified hyperlipidemia type       Relevant Orders   Lipid panel   Comprehensive  metabolic panel   Rash       Relevant Medications   clotrimazole-betamethasone (LOTRISONE) cream      Follow-up: Return in about 6 months (around 03/21/2020), or if symptoms worsen or fail to improve, for fasting, annual exam.  [name], DO

## 2019-09-21 NOTE — Patient Instructions (Signed)
Carbohydrate Counting for Diabetes Mellitus, Adult  Carbohydrate counting is a method of keeping track of how many carbohydrates you eat. Eating carbohydrates naturally increases the amount of sugar (glucose) in the blood. Counting how many carbohydrates you eat helps keep your blood glucose within normal limits, which helps you manage your diabetes (diabetes mellitus). It is important to know how many carbohydrates you can safely have in each meal. This is different for every person. A diet and nutrition specialist (registered dietitian) can help you make a meal plan and calculate how many carbohydrates you should have at each meal and snack. Carbohydrates are found in the following foods:  Grains, such as breads and cereals.  Dried beans and soy products.  Starchy vegetables, such as potatoes, peas, and corn.  Fruit and fruit juices.  Milk and yogurt.  Sweets and snack foods, such as cake, cookies, candy, chips, and soft drinks. How do I count carbohydrates? There are two ways to count carbohydrates in food. You can use either of the methods or a combination of both. Reading "Nutrition Facts" on packaged food The "Nutrition Facts" list is included on the labels of almost all packaged foods and beverages in the [place] It includes:  The serving size.  Information about nutrients in each serving, including the grams (g) of carbohydrate per serving. To use the "Nutrition Facts":  Decide how many servings you will have.  Multiply the number of servings by the number of carbohydrates per serving.  The resulting number is the total amount of carbohydrates that you will be having. Learning standard serving sizes of other foods When you eat carbohydrate foods that are not packaged or do not include "Nutrition Facts" on the label, you need to measure the servings in order to count the amount of carbohydrates:  Measure the foods that you will eat with a food scale or measuring cup, if needed.   Decide how many standard-size servings you will eat.  Multiply the number of servings by 15. Most carbohydrate-rich foods have about 15 g of carbohydrates per serving. ? For example, if you eat 8 oz (170 g) of strawberries, you will have eaten 2 servings and 30 g of carbohydrates (2 servings x 15 g = 30 g).  For foods that have more than one food mixed, such as soups and casseroles, you must count the carbohydrates in each food that is included. The following list contains standard serving sizes of common carbohydrate-rich foods. Each of these servings has about 15 g of carbohydrates:   hamburger bun or  English muffin.   oz (15 mL) syrup.   oz (14 g) jelly.  1 slice of bread.  1 six-inch tortilla.  3 oz (85 g) cooked rice or pasta.  4 oz (113 g) cooked dried beans.  4 oz (113 g) starchy vegetable, such as peas, corn, or potatoes.  4 oz (113 g) hot cereal.  4 oz (113 g) mashed potatoes or  of a large baked potato.  4 oz (113 g) canned or frozen fruit.  4 oz (120 mL) fruit juice.  4-6 crackers.  6 chicken nuggets.  6 oz (170 g) unsweetened dry cereal.  6 oz (170 g) plain fat-free yogurt or yogurt sweetened with artificial sweeteners.  8 oz (240 mL) milk.  8 oz (170 g) fresh fruit or one small piece of fruit.  24 oz (680 g) popped popcorn. Example of carbohydrate counting Sample meal  3 oz (85 g) chicken breast.  6 oz (170 g)   brown rice.  4 oz (113 g) corn.  8 oz (240 mL) milk.  8 oz (170 g) strawberries with sugar-free whipped topping. Carbohydrate calculation 1. Identify the foods that contain carbohydrates: ? Rice. ? Corn. ? Milk. ? Strawberries. 2. Calculate how many servings you have of each food: ? 2 servings rice. ? 1 serving corn. ? 1 serving milk. ? 1 serving strawberries. 3. Multiply each number of servings by 15 g: ? 2 servings rice x 15 g = 30 g. ? 1 serving corn x 15 g = 15 g. ? 1 serving milk x 15 g = 15 g. ? 1 serving  strawberries x 15 g = 15 g. 4. Add together all of the amounts to find the total grams of carbohydrates eaten: ? 30 g + 15 g + 15 g + 15 g = 75 g of carbohydrates total. Summary  Carbohydrate counting is a method of keeping track of how many carbohydrates you eat.  Eating carbohydrates naturally increases the amount of sugar (glucose) in the blood.  Counting how many carbohydrates you eat helps keep your blood glucose within normal limits, which helps you manage your diabetes.  A diet and nutrition specialist (registered dietitian) can help you make a meal plan and calculate how many carbohydrates you should have at each meal and snack. This information is not intended to replace advice given to you by your health care provider. Make sure you discuss any questions you have with your health care provider. Document Released: 11/18/2005 Document Revised: 06/12/2017 Document Reviewed: 05/01/2016 Elsevier Patient Education  2020 [employer]  

## 2019-09-21 NOTE — Assessment & Plan Note (Signed)
Tolerating statin, encouraged heart healthy diet, avoid trans fats, minimize simple carbs and saturated fats. Increase exercise as tolerated 

## 2019-09-21 NOTE — Assessment & Plan Note (Signed)
Check labs rx allopurinol

## 2019-09-21 NOTE — Assessment & Plan Note (Signed)
Well controlled, no changes to meds. Encouraged heart healthy diet such as the DASH diet and exercise as tolerated.  °

## 2019-09-22 LAB — MICROALBUMIN / CREATININE URINE RATIO
Creatinine,U: 172.1 mg/dL
Microalb Creat Ratio: 0.4 mg/g (ref 0.0–30.0)
Microalb, Ur: 0.8 mg/dL (ref 0.0–1.9)

## 2019-09-22 LAB — COMPREHENSIVE METABOLIC PANEL
ALT: 60 U/L — ABNORMAL HIGH (ref 0–53)
AST: 51 U/L — ABNORMAL HIGH (ref 0–37)
Albumin: 4.4 g/dL (ref 3.5–5.2)
Alkaline Phosphatase: 36 U/L — ABNORMAL LOW (ref 39–117)
BUN: 18 mg/dL (ref 6–23)
CO2: 29 mEq/L (ref 19–32)
Calcium: 9.6 mg/dL (ref 8.4–10.5)
Chloride: 100 mEq/L (ref 96–112)
Creatinine, Ser: 1.26 mg/dL (ref 0.40–1.50)
GFR: 71.99 mL/min (ref >60.00)
Glucose, Bld: 84 mg/dL (ref 70–99)
Potassium: 3.9 mEq/L (ref 3.5–5.1)
Sodium: 137 mEq/L (ref 135–145)
Total Bilirubin: 0.9 mg/dL (ref 0.2–1.2)
Total Protein: 7.5 g/dL (ref 6.0–8.3)

## 2019-09-22 LAB — LIPID PANEL
Cholesterol: 191 mg/dL (ref 0–200)
HDL: 50.4 mg/dL (ref >39.00)
LDL Cholesterol: 123 mg/dL — ABNORMAL HIGH (ref 0–99)
NonHDL: 140.38
Total CHOL/HDL Ratio: 4
Triglycerides: 86 mg/dL (ref 0.0–149.0)
VLDL: 17.2 mg/dL (ref 0.0–40.0)

## 2019-09-22 LAB — URIC ACID: Uric Acid, Serum: 5 mg/dL (ref 4.0–7.8)

## 2019-09-22 LAB — HEMOGLOBIN A1C: Hgb A1c MFr Bld: 6.6 % — ABNORMAL HIGH (ref 4.6–6.5)

## 2019-09-23 ENCOUNTER — Other Ambulatory Visit: Payer: Self-pay | Admitting: Family Medicine

## 2019-09-23 DIAGNOSIS — I10 Essential (primary) hypertension: Secondary | ICD-10-CM

## 2019-09-23 DIAGNOSIS — R739 Hyperglycemia, unspecified: Secondary | ICD-10-CM

## 2019-09-23 DIAGNOSIS — E785 Hyperlipidemia, unspecified: Secondary | ICD-10-CM

## 2019-11-12 ENCOUNTER — Other Ambulatory Visit: Payer: Self-pay | Admitting: Family Medicine

## 2019-11-12 DIAGNOSIS — I10 Essential (primary) hypertension: Secondary | ICD-10-CM

## 2019-11-14 ENCOUNTER — Other Ambulatory Visit: Payer: Self-pay | Admitting: Family Medicine

## 2019-11-17 ENCOUNTER — Other Ambulatory Visit: Payer: Self-pay | Admitting: Family Medicine

## 2019-11-17 DIAGNOSIS — R21 Rash and other nonspecific skin eruption: Secondary | ICD-10-CM

## 2019-11-17 DIAGNOSIS — L819 Disorder of pigmentation, unspecified: Secondary | ICD-10-CM

## 2019-11-20 ENCOUNTER — Other Ambulatory Visit: Payer: Self-pay | Admitting: Family Medicine

## 2019-11-20 DIAGNOSIS — J0140 Acute pansinusitis, unspecified: Secondary | ICD-10-CM

## 2019-12-15 ENCOUNTER — Other Ambulatory Visit: Payer: Self-pay | Admitting: Family Medicine

## 2019-12-23 ENCOUNTER — Other Ambulatory Visit: Payer: Self-pay | Admitting: Family Medicine

## 2019-12-23 DIAGNOSIS — R21 Rash and other nonspecific skin eruption: Secondary | ICD-10-CM

## 2020-02-15 ENCOUNTER — Other Ambulatory Visit: Payer: Self-pay | Admitting: Family Medicine

## 2020-02-15 DIAGNOSIS — L819 Disorder of pigmentation, unspecified: Secondary | ICD-10-CM

## 2020-02-15 DIAGNOSIS — R21 Rash and other nonspecific skin eruption: Secondary | ICD-10-CM

## 2020-02-19 ENCOUNTER — Other Ambulatory Visit: Payer: Self-pay | Admitting: Family Medicine

## 2020-02-19 DIAGNOSIS — J0140 Acute pansinusitis, unspecified: Secondary | ICD-10-CM

## 2020-02-25 ENCOUNTER — Other Ambulatory Visit: Payer: Self-pay | Admitting: Family Medicine

## 2020-02-25 DIAGNOSIS — I10 Essential (primary) hypertension: Secondary | ICD-10-CM

## 2020-03-11 ENCOUNTER — Other Ambulatory Visit: Payer: Self-pay | Admitting: Family Medicine

## 2020-03-11 DIAGNOSIS — J0140 Acute pansinusitis, unspecified: Secondary | ICD-10-CM

## 2020-03-23 ENCOUNTER — Other Ambulatory Visit: Payer: Self-pay

## 2020-03-23 ENCOUNTER — Ambulatory Visit (INDEPENDENT_AMBULATORY_CARE_PROVIDER_SITE_OTHER): Payer: BC Managed Care – PPO | Admitting: Family Medicine

## 2020-03-23 ENCOUNTER — Encounter: Payer: Self-pay | Admitting: Family Medicine

## 2020-03-23 ENCOUNTER — Telehealth: Payer: Self-pay

## 2020-03-23 VITALS — BP 132/90 | HR 82 | Temp 97.5°F | Resp 18 | Ht 68.0 in | Wt 245.0 lb

## 2020-03-23 DIAGNOSIS — Z0000 Encounter for general adult medical examination without abnormal findings: Secondary | ICD-10-CM | POA: Diagnosis not present

## 2020-03-23 DIAGNOSIS — Z1211 Encounter for screening for malignant neoplasm of colon: Secondary | ICD-10-CM

## 2020-03-23 DIAGNOSIS — E1165 Type 2 diabetes mellitus with hyperglycemia: Secondary | ICD-10-CM

## 2020-03-23 DIAGNOSIS — E785 Hyperlipidemia, unspecified: Secondary | ICD-10-CM

## 2020-03-23 DIAGNOSIS — I5023 Acute on chronic systolic (congestive) heart failure: Secondary | ICD-10-CM

## 2020-03-23 DIAGNOSIS — M1009 Idiopathic gout, multiple sites: Secondary | ICD-10-CM | POA: Diagnosis not present

## 2020-03-23 DIAGNOSIS — B351 Tinea unguium: Secondary | ICD-10-CM

## 2020-03-23 DIAGNOSIS — Z6835 Body mass index (BMI) 35.0-35.9, adult: Secondary | ICD-10-CM

## 2020-03-23 DIAGNOSIS — I10 Essential (primary) hypertension: Secondary | ICD-10-CM | POA: Diagnosis not present

## 2020-03-23 HISTORY — DX: Type 2 diabetes mellitus with hyperglycemia: E11.65

## 2020-03-23 LAB — URIC ACID: Uric Acid, Serum: 6 mg/dL (ref 4.0–7.8)

## 2020-03-23 LAB — COMPREHENSIVE METABOLIC PANEL
ALT: 64 U/L — ABNORMAL HIGH (ref 0–53)
AST: 39 U/L — ABNORMAL HIGH (ref 0–37)
Albumin: 4.3 g/dL (ref 3.5–5.2)
Alkaline Phosphatase: 38 U/L — ABNORMAL LOW (ref 39–117)
BUN: 16 mg/dL (ref 6–23)
CO2: 31 mEq/L (ref 19–32)
Calcium: 9.6 mg/dL (ref 8.4–10.5)
Chloride: 100 mEq/L (ref 96–112)
Creatinine, Ser: 1.25 mg/dL (ref 0.40–1.50)
GFR: 72.52 mL/min (ref >60.00)
Glucose, Bld: 107 mg/dL — ABNORMAL HIGH (ref 70–99)
Potassium: 3.9 mEq/L (ref 3.5–5.1)
Sodium: 139 mEq/L (ref 135–145)
Total Bilirubin: 0.4 mg/dL (ref 0.2–1.2)
Total Protein: 7.2 g/dL (ref 6.0–8.3)

## 2020-03-23 LAB — CBC WITH DIFFERENTIAL/PLATELET
Basophils Absolute: 0 10*3/uL (ref 0.0–0.1)
Basophils Relative: 0.6 % (ref 0.0–3.0)
Eosinophils Absolute: 0.1 10*3/uL (ref 0.0–0.7)
Eosinophils Relative: 1.9 % (ref 0.0–5.0)
HCT: 44.9 % (ref 39.0–52.0)
Hemoglobin: 14.6 g/dL (ref 13.0–17.0)
Lymphocytes Relative: 48.2 % — ABNORMAL HIGH (ref 12.0–46.0)
Lymphs Abs: 3 10*3/uL (ref 0.7–4.0)
MCHC: 32.5 g/dL (ref 30.0–36.0)
MCV: 84.5 fl (ref 78.0–100.0)
Monocytes Absolute: 0.8 10*3/uL (ref 0.1–1.0)
Monocytes Relative: 12.3 % — ABNORMAL HIGH (ref 3.0–12.0)
Neutro Abs: 2.3 10*3/uL (ref 1.4–7.7)
Neutrophils Relative %: 37 % — ABNORMAL LOW (ref 43.0–77.0)
Platelets: 174 10*3/uL (ref 150.0–400.0)
RBC: 5.32 Mil/uL (ref 4.22–5.81)
RDW: 14.5 % (ref 11.5–15.5)
WBC: 6.2 10*3/uL (ref 4.0–10.5)

## 2020-03-23 LAB — LIPID PANEL
Cholesterol: 186 mg/dL (ref 0–200)
HDL: 46 mg/dL (ref >39.00)
LDL Cholesterol: 120 mg/dL — ABNORMAL HIGH (ref 0–99)
NonHDL: 139.93
Total CHOL/HDL Ratio: 4
Triglycerides: 102 mg/dL (ref 0.0–149.0)
VLDL: 20.4 mg/dL (ref 0.0–40.0)

## 2020-03-23 LAB — TSH: TSH: 2.65 u[IU]/mL (ref 0.35–4.50)

## 2020-03-23 LAB — PSA: PSA: 1.15 ng/mL (ref 0.10–4.00)

## 2020-03-23 MED ORDER — CICLOPIROX 8 % EX SOLN: Freq: Every day | CUTANEOUS | 2 refills | Status: DC

## 2020-03-23 NOTE — Assessment & Plan Note (Signed)
hgba1c to be checked, minimize simple carbs. Increase exercise as tolerated. Continue current meds  

## 2020-03-23 NOTE — Telephone Encounter (Signed)
Pt's insurance does not cover Ciclopirox w/o fungal diagnostic testing (e.g. potassium hydroxide [KOH] preparation, fungal culture, or nail biopsy). Please advise.

## 2020-03-23 NOTE — Assessment & Plan Note (Signed)
Pt will restart the healthy weight and wellness diet

## 2020-03-23 NOTE — Telephone Encounter (Signed)
Let pt know and we can refer to podiatry if he would like treatment

## 2020-03-23 NOTE — Progress Notes (Signed)
Patient ID: Alfred Martin, male    DOB: 1966/03/31  Age: 54 y.o. MRN: [id number]    Subjective:  Subjective  HPI Dellon Revette presents for cpe and labs  Pt has no complaints   Review of Systems  Constitutional: Negative.   HENT: Negative for congestion, ear pain, hearing loss, nosebleeds, postnasal drip, rhinorrhea, sinus pressure, sneezing and tinnitus.   Eyes: Negative for photophobia, discharge, itching and visual disturbance.  Respiratory: Negative.   Cardiovascular: Negative.   Gastrointestinal: Negative for abdominal distention, abdominal pain, anal bleeding, blood in stool and constipation.  Endocrine: Negative.   Genitourinary: Negative.   Musculoskeletal: Negative.   Skin: Negative.   Allergic/Immunologic: Negative.   Neurological: Negative for dizziness, weakness, light-headedness, numbness and headaches.  Psychiatric/Behavioral: Negative for agitation, confusion, decreased concentration, dysphoric mood, sleep disturbance and suicidal ideas. The patient is not nervous/anxious.     History Past Medical History:  Diagnosis Date  . Allergy   . CHF (congestive heart failure) ([place])   . Gout   . HTN (hypertension)   . Hyperlipidemia    diet controlled and exercise  . Joint pain     He has a past surgical history that includes Tonsillectomy.   His family history includes Arthritis in his maternal aunt, maternal aunt, maternal aunt, and mother; Breast cancer in his maternal aunt and paternal grandmother; Colon cancer in his paternal uncle; Congestive Heart Failure in his brother and paternal uncle; Diabetes in his brother, father, and mother; Drug abuse in his brother; HIV/AIDS in his brother; Heart disease in his maternal grandmother; High blood pressure in his father and mother; Hyperlipidemia in his father and mother; Hypertension in his brother, brother, father, and mother; Liver disease in his father; Obesity in his mother; Stroke in his paternal uncle.He reports that he has  never smoked. He has never used smokeless tobacco. He reports that he does not drink alcohol or use drugs.  Current Outpatient Medications on File Prior to Visit  Medication Sig Dispense Refill  . allopurinol (ZYLOPRIM) 300 MG tablet TAKE 1 TABLET BY MOUTH EVERY DAY 90 tablet 2  . amLODipine-benazepril (LOTREL) 10-40 MG capsule TAKE 1 CAPSULE BY MOUTH EVERY DAY 90 capsule 1  . clotrimazole-betamethasone (LOTRISONE) cream APPLY TO AFFECTED AREA TWICE A DAY 30 g 1  . fluticasone (FLONASE) 50 MCG/ACT nasal spray SPRAY 2 SPRAYS INTO EACH NOSTRIL EVERY DAY 48 mL 1  . hydrochlorothiazide (HYDRODIURIL) 25 MG tablet TAKE 1 TABLET BY MOUTH EVERY DAY 90 tablet 1  . hydroquinone 4 % cream APPLY TO AFFECTED AREA TWICE A DAY 28.35 g 1  . levocetirizine (XYZAL) 5 MG tablet TAKE 1 TABLET BY MOUTH EVERY DAY IN THE EVENING 90 tablet 0  . metoprolol succinate (TOPROL-XL) 25 MG 24 hr tablet Take 1 tablet (25 mg total) by mouth daily. 90 tablet 1  . rosuvastatin (CRESTOR) 10 MG tablet Take 1 tablet (10 mg total) by mouth daily. 30 tablet 2  . sildenafil (VIAGRA) 100 MG tablet Take 0.5-1 tablets (50-100 mg total) by mouth daily as needed for erectile dysfunction. 10 tablet 11  . Vitamin D, Ergocalciferol, (DRISDOL) 50000 units CAPS capsule Take 1 capsule (50,000 Units total) by mouth every 7 (seven) days. 4 capsule 0   No current facility-administered medications on file prior to visit.      Health Maintenance  Topic Date Due  . PNEUMOCOCCAL POLYSACCHARIDE VACCINE AGE 43-64 HIGH RISK  Never done  . FOOT EXAM  Never done  . OPHTHALMOLOGY EXAM  Never done  . COLONOSCOPY  Never done  . HEMOGLOBIN A1C  03/21/2020  . COVID-19 Vaccine (1) 04/08/2020 (Originally 01/22/1982)  . INFLUENZA VACCINE  07/02/2020  . TETANUS/TDAP  05/31/2025  . HIV Screening  Completed   Objective:  Objective  Physical Exam Vitals and nursing note reviewed.  Constitutional:      General: He is not in acute distress.    Appearance: He  is well-developed. He is not diaphoretic.  HENT:     Head: Normocephalic and atraumatic.     Right Ear: External ear normal.     Left Ear: External ear normal.  Eyes:     Conjunctiva/sclera: Conjunctivae normal.     Pupils: Pupils are equal, round, and reactive to light.  Neck:     Thyroid: No thyromegaly.     Vascular: No JVD.  Cardiovascular:     Rate and Rhythm: Normal rate and regular rhythm.     Heart sounds: No murmur. No friction rub. No gallop.   Pulmonary:     Effort: Pulmonary effort is normal. No respiratory distress.     Breath sounds: Normal breath sounds. No wheezing or rales.  Chest:     Chest wall: No tenderness.  Abdominal:     General: Bowel sounds are normal. There is no distension.     Palpations: Abdomen is soft. There is no mass.     Tenderness: There is no abdominal tenderness. There is no guarding or rebound.  Genitourinary:    Comments: Pt refused  Musculoskeletal:        General: No tenderness. Normal range of motion.     Cervical back: Normal range of motion and neck supple.  Lymphadenopathy:     Cervical: No cervical adenopathy.  Skin:    General: Skin is warm and dry.     Coloration: Skin is not pale.     Findings: No erythema or rash.  Neurological:     Mental Status: He is alert and oriented to person, place, and time.     Motor: No abnormal muscle tone.     Deep Tendon Reflexes: Reflexes are normal and symmetric. Reflexes normal.  Psychiatric:        Behavior: Behavior normal.        Thought Content: Thought content normal.        Judgment: Judgment normal.    BP 132/90 (BP Location: Right Arm, Patient Position: Sitting, Cuff Size: Large)   Pulse 82   Temp (!) 97.5 F (36.4 C) (Temporal)   Resp 18   Ht 5\' 8"  (1.727 m)   Wt 245 lb (111.1 kg)   SpO2 98%   BMI 37.25 kg/m  Wt Readings from Last 3 Encounters:  03/23/20 245 lb (111.1 kg)  09/21/19 247 lb (112 kg)  09/21/18 234 lb (106.1 kg)     Lab Results  Component Value Date     WBC 6.2 03/23/2020   HGB 14.6 03/23/2020   HCT 44.9 03/23/2020   PLT 174.0 03/23/2020   GLUCOSE 107 (H) 03/23/2020   CHOL 186 03/23/2020   TRIG 102.0 03/23/2020   HDL 46.00 03/23/2020   LDLDIRECT 127.0 03/18/2019   LDLCALC 120 (H) 03/23/2020   ALT 64 (H) 03/23/2020   AST 39 (H) 03/23/2020   NA 139 03/23/2020   K 3.9 03/23/2020   CL 100 03/23/2020   CREATININE 1.25 03/23/2020   BUN 16 03/23/2020   CO2 31 03/23/2020   TSH 2.65 03/23/2020   PSA 1.15 03/23/2020  HGBA1C 6.6 (H) 09/21/2019   MICROALBUR 0.8 09/21/2019    CT ELBOW LEFT W CONTRAST  Result Date: 07/17/2018 CLINICAL DATA:  Left elbow swelling and increased warmth. The patient hit the elbow on a counter edge 10 days ago. He had fluid incised and drained four days ago. History of gout. EXAM: CT OF THE UPPER LEFT EXTREMITY WITH CONTRAST TECHNIQUE: Multidetector CT imaging of the upper left extremity was performed according to the standard protocol following intravenous contrast administration. COMPARISON:  Left elbow radiographs dated 07/11/2018. CONTRAST:  174mL ISOVUE-300 IOPAMIDOL (ISOVUE-300) INJECTION 61% FINDINGS: Mildly irregular fluid collection posterior to the olecranon with mildly thickened and irregular diffuse surrounding enhancing soft tissue. The fluid collection measures 2.6 x 2.0 cm on image number 137 series 4. This measures 3.6 cm in length on sagittal image number 52. There is edema an the adjacent subcutaneous fat. No calcifications. Moderately large and minimally fragmented olecranon spur. Otherwise, no fracture seen. Minimal coronoid process spur formation. No bone destruction or periosteal reaction. No soft tissue gas. IMPRESSION: 1. 2.6 x 2.0 x 2.0 cm abscess posterior to the olecranon at the location of the olecranon bursa. 2. No evidence of osteomyelitis. 3. Moderately large and minimally fragmented olecranon spur. Electronically Signed   By: Claudie Revering M.D.   On: 07/17/2018 19:47     Assessment &  Plan:  Plan  I am having Jacquelyne Balint start on ciclopirox. I am also having him maintain his Vitamin D (Ergocalciferol), sildenafil, rosuvastatin, hydrochlorothiazide, metoprolol succinate, allopurinol, hydroquinone, clotrimazole-betamethasone, levocetirizine, amLODipine-benazepril, and fluticasone.  Meds ordered this encounter  Medications  . ciclopirox (PENLAC) 8 % solution    Sig: Apply topically at bedtime. Apply over nail and surrounding skin. Apply daily over previous coat. After seven (7) days, may remove with alcohol and continue cycle.    Dispense:  6.6 mL    Refill:  2    Problem List Items Addressed This Visit      Unprioritized   Class 2 severe obesity with serious comorbidity and body mass index (BMI) of 35.0 to 35.9 in adult [facility])    Pt will restart the healthy weight and wellness diet       Gout of multiple sites    con't meds Stable Check labs       Relevant Orders   Uric acid (Completed)   HTN (hypertension)    Well controlled, no changes to meds. Encouraged heart healthy diet such as the DASH diet and exercise as tolerated. ---- slightly elevated today--- pt has been off diet and eating more salt lately       Relevant Orders   Lipid panel (Completed)   CBC with Differential/Platelet (Completed)   Comprehensive metabolic panel (Completed)   Uric acid (Completed)   Hyperlipidemia LDL goal <100    Encouraged heart healthy diet, increase exercise, avoid trans fats, consider a krill oil cap daily      Preventative health care - Primary    Pt refuses immunizations  Colon to be done Check labs See AVS        Relevant Orders   Lipid panel (Completed)   PSA (Completed)   TSH (Completed)   CBC with Differential/Platelet (Completed)   Comprehensive metabolic panel (Completed)   Uric acid (Completed)   Uncontrolled type 2 diabetes mellitus with hyperglycemia ([place])    hgba1c to be checked , minimize simple carbs. Increase exercise as tolerated. 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  [name], DO

## 2020-03-23 NOTE — Assessment & Plan Note (Signed)
con't meds Stable Check labs

## 2020-03-23 NOTE — Assessment & Plan Note (Signed)
Pt refuses immunizations  Colon to be done Check labs See AVS

## 2020-03-23 NOTE — Patient Instructions (Signed)
Preventive Care 41-55 Years Old, Male Preventive care refers to lifestyle choices and visits with your health care provider that can promote health and wellness. This includes:  A yearly physical exam. This is also called an annual well check.  Regular dental and eye exams.  Immunizations.  Screening for certain conditions.  Healthy lifestyle choices, such as eating a healthy diet, getting regular exercise, not using drugs or products that contain nicotine and tobacco, and limiting alcohol use. What can I expect for my preventive care visit? Physical exam Your health care provider will check:  Height and weight. These may be used to calculate body mass index (BMI), which is a measurement that tells if you are at a healthy weight.  Heart rate and blood pressure.  Your skin for abnormal spots. Counseling Your health care provider may ask you questions about:  Alcohol, tobacco, and drug use.  Emotional well-being.  Home and relationship well-being.  Sexual activity.  Eating habits.  Work and work Statistician. What immunizations do I need?  Influenza (flu) vaccine  This is recommended every year. Tetanus, diphtheria, and pertussis (Tdap) vaccine  You may need a Td booster every 10 years. Varicella (chickenpox) vaccine  You may need this vaccine if you have not already been vaccinated. Zoster (shingles) vaccine  You may need this after age 64. Measles, mumps, and rubella (MMR) vaccine  You may need at least one dose of MMR if you were born in 1957 or later. You may also need a second dose. Pneumococcal conjugate (PCV13) vaccine  You may need this if you have certain conditions and were not previously vaccinated. Pneumococcal polysaccharide (PPSV23) vaccine  You may need one or two doses if you smoke cigarettes or if you have certain conditions. Meningococcal conjugate (MenACWY) vaccine  You may need this if you have certain conditions. Hepatitis A  vaccine  You may need this if you have certain conditions or if you travel or work in places where you may be exposed to hepatitis A. Hepatitis B vaccine  You may need this if you have certain conditions or if you travel or work in places where you may be exposed to hepatitis B. Haemophilus influenzae type b (Hib) vaccine  You may need this if you have certain risk factors. Human papillomavirus (HPV) vaccine  If recommended by your health care provider, you may need three doses over 6 months. You may receive vaccines as individual doses or as more than one vaccine together in one shot (combination vaccines). Talk with your health care provider about the risks and benefits of combination vaccines. What tests do I need? Blood tests  Lipid and cholesterol levels. These may be checked every 5 years, or more frequently if you are over 60 years old.  Hepatitis C test.  Hepatitis B test. Screening  Lung cancer screening. You may have this screening every year starting at age 43 if you have a 30-pack-year history of smoking and currently smoke or have quit within the past 15 years.  Prostate cancer screening. Recommendations will vary depending on your family history and other risks.  Colorectal cancer screening. All adults should have this screening starting at age 72 and continuing until age 2. Your health care provider may recommend screening at age 14 if you are at increased risk. You will have tests every 1-10 years, depending on your results and the type of screening test.  Diabetes screening. This is done by checking your blood sugar (glucose) after you have not eaten  for a while (fasting). You may have this done every 1-3 years.  Sexually transmitted disease (STD) testing. Follow these instructions at home: Eating and drinking  Eat a diet that includes fresh fruits and vegetables, whole grains, lean protein, and low-fat dairy products.  Take vitamin and mineral supplements as  recommended by your health care provider.  Do not drink alcohol if your health care provider tells you not to drink.  If you drink alcohol: ? Limit how much you have to 0-2 drinks a day. ? Be aware of how much alcohol is in your drink. In the [place] one drink equals one 12 oz bottle of beer (355 mL), one 5 oz glass of wine (148 mL), or one 1 oz glass of hard liquor (44 mL). Lifestyle  Take daily care of your teeth and gums.  Stay active. Exercise for at least 30 minutes on 5 or more days each week.  Do not use any products that contain nicotine or tobacco, such as cigarettes, e-cigarettes, and chewing tobacco. If you need help quitting, ask your health care provider.  If you are sexually active, practice safe sex. Use a condom or other form of protection to prevent STIs (sexually transmitted infections).  Talk with your health care provider about taking a low-dose aspirin every day starting at age 53. What's next?  Go to your health care provider once a year for a well check visit.  Ask your health care provider how often you should have your eyes and teeth checked.  Stay up to date on all vaccines. This information is not intended to replace advice given to you by your health care provider. Make sure you discuss any questions you have with your health care provider. Document Revised: 11/12/2018 Document Reviewed: 11/12/2018 Elsevier Patient Education  2020 [employer].

## 2020-03-23 NOTE — Assessment & Plan Note (Signed)
Encouraged heart healthy diet, increase exercise, avoid trans fats, consider a krill oil cap daily 

## 2020-03-23 NOTE — Assessment & Plan Note (Signed)
Well controlled, no changes to meds. Encouraged heart healthy diet such as the DASH diet and exercise as tolerated. ---- slightly elevated today--- pt has been off diet and eating more salt lately

## 2020-03-27 ENCOUNTER — Other Ambulatory Visit: Payer: Self-pay | Admitting: Family Medicine

## 2020-03-27 DIAGNOSIS — B351 Tinea unguium: Secondary | ICD-10-CM

## 2020-03-27 NOTE — Telephone Encounter (Signed)
Notified pt and he would like to proceed with referral. I have pended the referral. Pt requests specialist in [facility].

## 2020-03-28 ENCOUNTER — Other Ambulatory Visit: Payer: Self-pay | Admitting: Family Medicine

## 2020-03-28 DIAGNOSIS — I10 Essential (primary) hypertension: Secondary | ICD-10-CM

## 2020-05-07 ENCOUNTER — Other Ambulatory Visit: Payer: Self-pay | Admitting: Family Medicine

## 2020-05-25 ENCOUNTER — Other Ambulatory Visit: Payer: Self-pay

## 2020-05-25 ENCOUNTER — Ambulatory Visit: Payer: BC Managed Care – PPO | Admitting: Family Medicine

## 2020-05-25 ENCOUNTER — Encounter: Payer: Self-pay | Admitting: Family Medicine

## 2020-05-25 VITALS — BP 125/80 | HR 77 | Temp 97.4°F | Resp 16 | Ht 68.0 in | Wt 253.0 lb

## 2020-05-25 DIAGNOSIS — R21 Rash and other nonspecific skin eruption: Secondary | ICD-10-CM | POA: Diagnosis not present

## 2020-05-25 DIAGNOSIS — E559 Vitamin D deficiency, unspecified: Secondary | ICD-10-CM | POA: Diagnosis not present

## 2020-05-25 DIAGNOSIS — E785 Hyperlipidemia, unspecified: Secondary | ICD-10-CM

## 2020-05-25 DIAGNOSIS — I10 Essential (primary) hypertension: Secondary | ICD-10-CM

## 2020-05-25 LAB — LIPID PANEL
Cholesterol: 189 mg/dL (ref 0–200)
HDL: 41.5 mg/dL (ref >39.00)
LDL Cholesterol: 119 mg/dL — ABNORMAL HIGH (ref 0–99)
NonHDL: 147.5
Total CHOL/HDL Ratio: 5
Triglycerides: 144 mg/dL (ref 0.0–149.0)
VLDL: 28.8 mg/dL (ref 0.0–40.0)

## 2020-05-25 LAB — COMPREHENSIVE METABOLIC PANEL
ALT: 71 U/L — ABNORMAL HIGH (ref 0–53)
AST: 45 U/L — ABNORMAL HIGH (ref 0–37)
Albumin: 4.2 g/dL (ref 3.5–5.2)
Alkaline Phosphatase: 35 U/L — ABNORMAL LOW (ref 39–117)
BUN: 17 mg/dL (ref 6–23)
CO2: 29 mEq/L (ref 19–32)
Calcium: 9.6 mg/dL (ref 8.4–10.5)
Chloride: 102 mEq/L (ref 96–112)
Creatinine, Ser: 1.22 mg/dL (ref 0.40–1.50)
GFR: 74.53 mL/min (ref >60.00)
Glucose, Bld: 127 mg/dL — ABNORMAL HIGH (ref 70–99)
Potassium: 3.8 mEq/L (ref 3.5–5.1)
Sodium: 136 mEq/L (ref 135–145)
Total Bilirubin: 0.6 mg/dL (ref 0.2–1.2)
Total Protein: 6.9 g/dL (ref 6.0–8.3)

## 2020-05-25 LAB — VITAMIN D 25 HYDROXY (VIT D DEFICIENCY, FRACTURES): VITD: 20.51 ng/mL — ABNORMAL LOW (ref 30.00–100.00)

## 2020-05-25 MED ORDER — CLOTRIMAZOLE-BETAMETHASONE 1-0.05 % EX CREA: TOPICAL_CREAM | CUTANEOUS | 1 refills | Status: DC

## 2020-05-25 NOTE — Patient Instructions (Signed)
DASH Eating Plan DASH stands for "Dietary Approaches to Stop Hypertension." The DASH eating plan is a healthy eating plan that has been shown to reduce high blood pressure (hypertension). It may also reduce your risk for type 2 diabetes, heart disease, and stroke. The DASH eating plan may also help with weight loss. What are tips for following this plan?  General guidelines  Avoid eating more than 2,300 mg (milligrams) of salt (sodium) a day. If you have hypertension, you may need to reduce your sodium intake to 1,500 mg a day.  Limit alcohol intake to no more than 1 drink a day for nonpregnant women and 2 drinks a day for men. One drink equals 12 oz of beer, 5 oz of wine, or 1 oz of hard liquor.  Work with your health care provider to maintain a healthy body weight or to lose weight. Ask what an ideal weight is for you.  Get at least 30 minutes of exercise that causes your heart to beat faster (aerobic exercise) most days of the week. Activities may include walking, swimming, or biking.  Work with your health care provider or diet and nutrition specialist (dietitian) to adjust your eating plan to your individual calorie needs. Reading food labels   Check food labels for the amount of sodium per serving. Choose foods with less than 5 percent of the Daily Value of sodium. Generally, foods with less than 300 mg of sodium per serving fit into this eating plan.  To find whole grains, look for the word "whole" as the first word in the ingredient list. Shopping  Buy products labeled as "low-sodium" or "no salt added."  Buy fresh foods. Avoid canned foods and premade or frozen meals. Cooking  Avoid adding salt when cooking. Use salt-free seasonings or herbs instead of table salt or sea salt. Check with your health care provider or pharmacist before using salt substitutes.  Do not fry foods. Cook foods using healthy methods such as baking, boiling, grilling, and broiling instead.  Cook with  heart-healthy oils, such as olive, canola, soybean, or sunflower oil. Meal planning  Eat a balanced diet that includes: ? 5 or more servings of fruits and vegetables each day. At each meal, try to fill half of your plate with fruits and vegetables. ? Up to 6-8 servings of whole grains each day. ? Less than 6 oz of lean meat, poultry, or fish each day. A 3-oz serving of meat is about the same size as a deck of cards. One egg equals 1 oz. ? 2 servings of low-fat dairy each day. ? A serving of nuts, seeds, or beans 5 times each week. ? Heart-healthy fats. Healthy fats called Omega-3 fatty acids are found in foods such as flaxseeds and coldwater fish, like sardines, salmon, and mackerel.  Limit how much you eat of the following: ? Canned or prepackaged foods. ? Food that is high in trans fat, such as fried foods. ? Food that is high in saturated fat, such as fatty meat. ? Sweets, desserts, sugary drinks, and other foods with added sugar. ? Full-fat dairy products.  Do not salt foods before eating.  Try to eat at least 2 vegetarian meals each week.  Eat more home-cooked food and less restaurant, buffet, and fast food.  When eating at a restaurant, ask that your food be prepared with less salt or no salt, if possible. What foods are recommended? The items listed may not be a complete list. Talk with your dietitian about   what dietary choices are best for you. Grains Whole-grain or whole-wheat bread. Whole-grain or whole-wheat pasta. Brown rice. Oatmeal. Quinoa. Bulgur. Whole-grain and low-sodium cereals. Pita bread. Low-fat, low-sodium crackers. Whole-wheat flour tortillas. Vegetables Fresh or frozen vegetables (raw, steamed, roasted, or grilled). Low-sodium or reduced-sodium tomato and vegetable juice. Low-sodium or reduced-sodium tomato sauce and tomato paste. Low-sodium or reduced-sodium canned vegetables. Fruits All fresh, dried, or frozen fruit. Canned fruit in natural juice (without  added sugar). Meat and other protein foods Skinless chicken or turkey. Ground chicken or turkey. Pork with fat trimmed off. Fish and seafood. Egg whites. Dried beans, peas, or lentils. Unsalted nuts, nut butters, and seeds. Unsalted canned beans. Lean cuts of beef with fat trimmed off. Low-sodium, lean deli meat. Dairy Low-fat (1%) or fat-free (skim) milk. Fat-free, low-fat, or reduced-fat cheeses. Nonfat, low-sodium ricotta or cottage cheese. Low-fat or nonfat yogurt. Low-fat, low-sodium cheese. Fats and oils Soft margarine without trans fats. Vegetable oil. Low-fat, reduced-fat, or light mayonnaise and salad dressings (reduced-sodium). Canola, safflower, olive, soybean, and sunflower oils. Avocado. Seasoning and other foods Herbs. Spices. Seasoning mixes without salt. Unsalted popcorn and pretzels. Fat-free sweets. What foods are not recommended? The items listed may not be a complete list. Talk with your dietitian about what dietary choices are best for you. Grains Baked goods made with fat, such as croissants, muffins, or some breads. Dry pasta or rice meal packs. Vegetables Creamed or fried vegetables. Vegetables in a cheese sauce. Regular canned vegetables (not low-sodium or reduced-sodium). Regular canned tomato sauce and paste (not low-sodium or reduced-sodium). Regular tomato and vegetable juice (not low-sodium or reduced-sodium). Pickles. Olives. Fruits Canned fruit in a light or heavy syrup. Fried fruit. Fruit in cream or butter sauce. Meat and other protein foods Fatty cuts of meat. Ribs. Fried meat. Bacon. Sausage. Bologna and other processed lunch meats. Salami. Fatback. Hotdogs. Bratwurst. Salted nuts and seeds. Canned beans with added salt. Canned or smoked fish. Whole eggs or egg yolks. Chicken or turkey with skin. Dairy Whole or 2% milk, cream, and half-and-half. Whole or full-fat cream cheese. Whole-fat or sweetened yogurt. Full-fat cheese. Nondairy creamers. Whipped toppings.  Processed cheese and cheese spreads. Fats and oils Butter. Stick margarine. Lard. Shortening. Ghee. Bacon fat. Tropical oils, such as coconut, palm kernel, or palm oil. Seasoning and other foods Salted popcorn and pretzels. Onion salt, garlic salt, seasoned salt, table salt, and sea salt. Worcestershire sauce. Tartar sauce. Barbecue sauce. Teriyaki sauce. Soy sauce, including reduced-sodium. Steak sauce. Canned and packaged gravies. Fish sauce. Oyster sauce. Cocktail sauce. Horseradish that you find on the shelf. Ketchup. Mustard. Meat flavorings and tenderizers. Bouillon cubes. Hot sauce and Tabasco sauce. Premade or packaged marinades. Premade or packaged taco seasonings. Relishes. Regular salad dressings. Where to find more information:  National Heart, Lung, and Blood Institute: www.nhlbi.nih.gov  American Heart Association: www.heart.org Summary  The DASH eating plan is a healthy eating plan that has been shown to reduce high blood pressure (hypertension). It may also reduce your risk for type 2 diabetes, heart disease, and stroke.  With the DASH eating plan, you should limit salt (sodium) intake to 2,300 mg a day. If you have hypertension, you may need to reduce your sodium intake to 1,500 mg a day.  When on the DASH eating plan, aim to eat more fresh fruits and vegetables, whole grains, lean proteins, low-fat dairy, and heart-healthy fats.  Work with your health care provider or diet and nutrition specialist (dietitian) to adjust your eating plan to your   individual calorie needs. This information is not intended to replace advice given to you by your health care provider. Make sure you discuss any questions you have with your health care provider. Document Revised: 10/31/2017 Document Reviewed: 11/11/2016 Elsevier Patient Education  2020 Elsevier Inc.  

## 2020-05-25 NOTE — Assessment & Plan Note (Signed)
Encouraged heart healthy diet, increase exercise, avoid trans fats, consider a krill oil cap daily 

## 2020-05-25 NOTE — Assessment & Plan Note (Signed)
Well controlled, no changes to meds. Encouraged heart healthy diet such as the DASH diet and exercise as tolerated.  °

## 2020-05-25 NOTE — Progress Notes (Signed)
Patient ID: Alfred Martin, male    DOB: 10-16-1966  Age: 54 y.o. MRN: [id number]    Subjective:  Subjective  HPI Alfred Martin presents for f/u bp     No complaints   Review of Systems  Constitutional: Negative for fever.  HENT: Negative for congestion.   Respiratory: Negative for cough and shortness of breath.   Cardiovascular: Negative for chest pain, palpitations and leg swelling.  Gastrointestinal: Negative for abdominal pain, blood in stool, nausea and vomiting.  Genitourinary: Negative for dysuria and frequency.  Musculoskeletal: Negative for back pain.  Skin: Negative for rash.  Allergic/Immunologic: Negative for environmental allergies.  Neurological: Negative for dizziness and headaches.  Psychiatric/Behavioral: The patient is not nervous/anxious.     History Past Medical History:  Diagnosis Date  . Allergy   . CHF (congestive heart failure) ([place])   . Gout   . HTN (hypertension)   . Hyperlipidemia    diet controlled and exercise  . Joint pain     He has a past surgical history that includes Tonsillectomy.   His family history includes Arthritis in his maternal aunt, maternal aunt, maternal aunt, and mother; Breast cancer in his maternal aunt and paternal grandmother; Colon cancer in his paternal uncle; Congestive Heart Failure in his brother and paternal uncle; Diabetes in his brother, father, and mother; Drug abuse in his brother; HIV/AIDS in his brother; Heart disease in his maternal grandmother; High blood pressure in his father and mother; Hyperlipidemia in his father and mother; Hypertension in his brother, brother, father, and mother; Liver disease in his father; Obesity in his mother; Stroke in his paternal uncle.He reports that he has never smoked. He has never used smokeless tobacco. He reports that he does not drink alcohol and does not use drugs.  Current Outpatient Medications on File Prior to Visit  Medication Sig Dispense Refill  . allopurinol (ZYLOPRIM) 300 MG  tablet TAKE 1 TABLET BY MOUTH EVERY DAY 90 tablet 2  . amLODipine-benazepril (LOTREL) 10-40 MG capsule TAKE 1 CAPSULE BY MOUTH EVERY DAY 90 capsule 1  . fluticasone (FLONASE) 50 MCG/ACT nasal spray SPRAY 2 SPRAYS INTO EACH NOSTRIL EVERY DAY 48 mL 1  . hydrochlorothiazide (HYDRODIURIL) 25 MG tablet TAKE 1 TABLET BY MOUTH EVERY DAY 90 tablet 1  . hydroquinone 4 % cream APPLY TO AFFECTED AREA TWICE A DAY 28.35 g 1  . levocetirizine (XYZAL) 5 MG tablet TAKE 1 TABLET BY MOUTH EVERY DAY IN THE EVENING 90 tablet 0  . metoprolol succinate (TOPROL-XL) 25 MG 24 hr tablet TAKE 1 TABLET BY MOUTH EVERY DAY 90 tablet 1  . rosuvastatin (CRESTOR) 10 MG tablet Take 1 tablet (10 mg total) by mouth daily. 30 tablet 2  . sildenafil (VIAGRA) 100 MG tablet Take 0.5-1 tablets (50-100 mg total) by mouth daily as needed for erectile dysfunction. 10 tablet 11  . Vitamin D, Ergocalciferol, (DRISDOL) 50000 units CAPS capsule Take 1 capsule (50,000 Units total) by mouth every 7 (seven) days. 4 capsule 0   No current facility-administered medications on file prior to visit.     Objective:  Objective  Physical Exam Vitals and nursing note reviewed.  Constitutional:      General: He is sleeping.     Appearance: He is well-developed.  HENT:     Head: Normocephalic and atraumatic.  Eyes:     Pupils: Pupils are equal, round, and reactive to light.  Neck:     Thyroid: No thyromegaly.  Cardiovascular:     Rate  and Rhythm: Normal rate and regular rhythm.     Heart sounds: No murmur heard.   Pulmonary:     Effort: Pulmonary effort is normal. No respiratory distress.     Breath sounds: Normal breath sounds. No wheezing or rales.  Chest:     Chest wall: No tenderness.  Musculoskeletal:        General: No tenderness.     Cervical back: Normal range of motion and neck supple.  Skin:    General: Skin is warm and dry.  Neurological:     Mental Status: He is oriented to person, place, and time.  Psychiatric:         Behavior: Behavior normal.        Thought Content: Thought content normal.        Judgment: Judgment normal.    BP 125/80 (BP Location: Left Arm, Patient Position: Sitting, Cuff Size: Large)   Pulse 77   Temp (!) 97.4 F (36.3 C) (Temporal)   Resp 16   Ht 5\' 8"  (1.727 m)   Wt 253 lb (114.8 kg)   SpO2 96%   BMI 38.47 kg/m  Wt Readings from Last 3 Encounters:  05/25/20 253 lb (114.8 kg)  03/23/20 245 lb (111.1 kg)  09/21/19 247 lb (112 kg)     Lab Results  Component Value Date   WBC 6.2 03/23/2020   HGB 14.6 03/23/2020   HCT 44.9 03/23/2020   PLT 174.0 03/23/2020   GLUCOSE 107 (H) 03/23/2020   CHOL 186 03/23/2020   TRIG 102.0 03/23/2020   HDL 46.00 03/23/2020   LDLDIRECT 127.0 03/18/2019   LDLCALC 120 (H) 03/23/2020   ALT 64 (H) 03/23/2020   AST 39 (H) 03/23/2020   NA 139 03/23/2020   K 3.9 03/23/2020   CL 100 03/23/2020   CREATININE 1.25 03/23/2020   BUN 16 03/23/2020   CO2 31 03/23/2020   TSH 2.65 03/23/2020   PSA 1.15 03/23/2020   HGBA1C 6.6 (H) 09/21/2019   MICROALBUR 0.8 09/21/2019    CT ELBOW LEFT W CONTRAST  Result Date: 07/17/2018 CLINICAL DATA:  Left elbow swelling and increased warmth. The patient hit the elbow on a counter edge 10 days ago. He had fluid incised and drained four days ago. History of gout. EXAM: CT OF THE UPPER LEFT EXTREMITY WITH CONTRAST TECHNIQUE: Multidetector CT imaging of the upper left extremity was performed according to the standard protocol following intravenous contrast administration. COMPARISON:  Left elbow radiographs dated 07/11/2018. CONTRAST:  163mL ISOVUE-300 IOPAMIDOL (ISOVUE-300) INJECTION 61% FINDINGS: Mildly irregular fluid collection posterior to the olecranon with mildly thickened and irregular diffuse surrounding enhancing soft tissue. The fluid collection measures 2.6 x 2.0 cm on image number 137 series 4. This measures 3.6 cm in length on sagittal image number 52. There is edema an the adjacent subcutaneous fat. No  calcifications. Moderately large and minimally fragmented olecranon spur. Otherwise, no fracture seen. Minimal coronoid process spur formation. No bone destruction or periosteal reaction. No soft tissue gas. IMPRESSION: 1. 2.6 x 2.0 x 2.0 cm abscess posterior to the olecranon at the location of the olecranon bursa. 2. No evidence of osteomyelitis. 3. Moderately large and minimally fragmented olecranon spur. Electronically Signed   By: Claudie Revering M.D.   On: 07/17/2018 19:47     Assessment & Plan:  Plan  I have discontinued Fleetwood Plass's ciclopirox. I am also having him maintain his Vitamin D (Ergocalciferol), sildenafil, rosuvastatin, allopurinol, hydroquinone, levocetirizine, amLODipine-benazepril, fluticasone, hydrochlorothiazide, metoprolol succinate, and  clotrimazole-betamethasone.  Meds ordered this encounter  Medications  . clotrimazole-betamethasone (LOTRISONE) cream    Sig: APPLY TO AFFECTED AREA TWICE A DAY    Dispense:  30 g    Refill:  1    Problem List Items Addressed This Visit      Unprioritized   HTN (hypertension) - Primary    Well controlled, no changes to meds. Encouraged heart healthy diet such as the DASH diet and exercise as tolerated.       Relevant Orders   Lipid panel   Comprehensive metabolic panel   Hyperlipidemia LDL goal <100    Encouraged heart healthy diet, increase exercise, avoid trans fats, consider a krill oil cap daily       Other Visit Diagnoses    Dyslipidemia       Relevant Orders   Lipid panel   Comprehensive metabolic panel   Vitamin D deficiency       Relevant Orders   Vitamin D (25 hydroxy)   Rash       Relevant Medications   clotrimazole-betamethasone (LOTRISONE) cream      Follow-up: Return in about 6 months (around 11/24/2020), or if symptoms worsen or fail to improve, for annual exam, fasting.  [name], DO

## 2020-05-27 ENCOUNTER — Other Ambulatory Visit: Payer: Self-pay | Admitting: Family Medicine

## 2020-05-27 DIAGNOSIS — E785 Hyperlipidemia, unspecified: Secondary | ICD-10-CM

## 2020-05-27 DIAGNOSIS — E559 Vitamin D deficiency, unspecified: Secondary | ICD-10-CM

## 2020-05-31 ENCOUNTER — Other Ambulatory Visit: Payer: Self-pay

## 2020-05-31 DIAGNOSIS — E559 Vitamin D deficiency, unspecified: Secondary | ICD-10-CM

## 2020-05-31 MED ORDER — ROSUVASTATIN CALCIUM 20 MG PO TABS: 20.0000 mg | ORAL_TABLET | Freq: Every day | ORAL | 1 refills | Status: DC

## 2020-05-31 MED ORDER — VITAMIN D (ERGOCALCIFEROL) 1.25 MG (50000 UNIT) PO CAPS: 50000.0000 [IU] | ORAL_CAPSULE | ORAL | 1 refills | Status: DC

## 2020-06-02 ENCOUNTER — Ambulatory Visit (INDEPENDENT_AMBULATORY_CARE_PROVIDER_SITE_OTHER): Payer: BC Managed Care – PPO | Admitting: Family Medicine

## 2020-06-02 ENCOUNTER — Other Ambulatory Visit: Payer: Self-pay

## 2020-06-02 ENCOUNTER — Encounter: Payer: Self-pay | Admitting: Family Medicine

## 2020-06-02 VITALS — BP 138/90 | HR 77 | Temp 97.6°F | Resp 18 | Ht 68.0 in | Wt 251.0 lb

## 2020-06-02 DIAGNOSIS — L918 Other hypertrophic disorders of the skin: Secondary | ICD-10-CM | POA: Diagnosis not present

## 2020-06-02 DIAGNOSIS — Q828 Other specified congenital malformations of skin: Secondary | ICD-10-CM

## 2020-06-02 NOTE — Patient Instructions (Signed)
Skin Tag, Adult  A skin tag (acrochordon) is a soft, extra growth of skin. Most skin tags are flesh-colored and rarely bigger than a pencil eraser. They commonly form near areas where there are folds in the skin, such as the armpit or groin. Skin tags are not dangerous, and they do not spread from person to person (are not contagious). You may have one skin tag or several. Skin tags do not require treatment. However, your health care provider may recommend removal of a skin tag if it:  Gets irritated from clothing.  Bleeds.  Is visible and unsightly. Your health care provider can remove skin tags with a simple surgical procedure or a procedure that involves freezing the skin tag. Follow these instructions at home:  Watch for any changes in your skin tag. A normal skin tag does not require any other special care at home.  Take over-the-counter and prescription medicines only as told by your health care provider.  Keep all follow-up visits as told by your health care provider. This is important. Contact a health care provider if:  You have a skin tag that: ? Becomes painful. ? Changes color. ? Bleeds. ? Swells.  You develop more skin tags. This information is not intended to replace advice given to you by your health care provider. Make sure you discuss any questions you have with your health care provider. Document Revised: 10/31/2017 Document Reviewed: 12/03/2015 Elsevier Patient Education  2020 Elsevier Inc.  

## 2020-06-02 NOTE — Progress Notes (Signed)
Skin Tag Removal Procedure Note Diagnosis: inflamed skin tags Location: neck , Left Informed Consent: Discussed risks (permanent scarring, infection, pain, bleeding, bruising, redness, and recurrence of the lesion) and benefits of the procedure, as well as the alternatives. He is aware that skin tags are benign lesions, and their removal is often not considered medically necessary. Informed consent was obtained. Preparation: The area was prepared in a standard fashion. Anesthesia: Lidocaine 1% without epinephrine Procedure Details: Iris scissors were used to perform sharp removal. Aluminum chloride was applied for hemostasis. Ointment and bandage were applied where needed. The patient tolerated the procedure well. Total number of lesions treated: 5 Plan: The patient was instructed on post-op care. Recommend OTC analgesia as needed for pain.

## 2020-06-07 ENCOUNTER — Other Ambulatory Visit: Payer: Self-pay | Admitting: Family Medicine

## 2020-06-07 DIAGNOSIS — B351 Tinea unguium: Secondary | ICD-10-CM

## 2020-06-10 ENCOUNTER — Other Ambulatory Visit: Payer: Self-pay | Admitting: Family Medicine

## 2020-06-10 DIAGNOSIS — N529 Male erectile dysfunction, unspecified: Secondary | ICD-10-CM

## 2020-06-11 ENCOUNTER — Other Ambulatory Visit: Payer: Self-pay | Admitting: Family Medicine

## 2020-06-11 DIAGNOSIS — J0140 Acute pansinusitis, unspecified: Secondary | ICD-10-CM

## 2020-06-16 ENCOUNTER — Telehealth: Payer: Self-pay | Admitting: Family Medicine

## 2020-06-16 ENCOUNTER — Other Ambulatory Visit: Payer: Self-pay | Admitting: Family Medicine

## 2020-06-16 DIAGNOSIS — J0140 Acute pansinusitis, unspecified: Secondary | ICD-10-CM

## 2020-06-16 DIAGNOSIS — I10 Essential (primary) hypertension: Secondary | ICD-10-CM

## 2020-06-16 DIAGNOSIS — B351 Tinea unguium: Secondary | ICD-10-CM

## 2020-06-16 NOTE — Telephone Encounter (Signed)
Medication: ciclopirox (PENLAC) 8 % solution [[id number]]      Has the patient contacted their pharmacy?  (If no, request that the patient contact the pharmacy for the refill.) (If yes, when and what did the pharmacy advise?)     Preferred Pharmacy (with phone number or street name): [id number] - [place], [address]  [place], [place] [address]  Phone:  [phone] Fax:  [phone]      Agent: Please be advised that RX refills may take up to 3 business days. We ask that you follow-up with your pharmacy.

## 2020-06-16 NOTE — Telephone Encounter (Signed)
Pt notified the Rx was already filled

## 2020-08-10 ENCOUNTER — Telehealth: Payer: BC Managed Care – PPO | Admitting: Physician Assistant

## 2020-08-10 DIAGNOSIS — H579 Unspecified disorder of eye and adnexa: Secondary | ICD-10-CM | POA: Diagnosis not present

## 2020-08-10 MED ORDER — POLYMYXIN B-TRIMETHOPRIM 10000-0.1 UNIT/ML-% OP SOLN: 1.0000 [drp] | Freq: Four times a day (QID) | OPHTHALMIC | 0 refills | Status: DC

## 2020-08-10 NOTE — Progress Notes (Signed)
E-Visit for [employer]  We are sorry that you are not feeling well.  Here is how we plan to help!  Based on what you have shared with me it looks like you have conjunctivitis.  Conjunctivitis is a common inflammatory or infectious condition of the eye that is often referred to as "pink eye".  In most cases it is contagious (viral or bacterial). However, not all conjunctivitis requires antibiotics (ex. Allergic).  We have made appropriate suggestions for you based upon your presentation.  I have prescribed Polytrim Ophthalmic drops 1-2 drops 4 times a day times 5 days  Pink eye can be highly contagious.  It is typically spread through direct contact with secretions, or contaminated objects or surfaces that one may have touched.  Strict handwashing is suggested with soap and water is urged.  If not available, use alcohol based had sanitizer.  Avoid unnecessary touching of the eye.  If you wear contact lenses, you will need to refrain from wearing them until you see no white discharge from the eye for at least 24 hours after being on medication.  You should see symptom improvement in 1-2 days after starting the medication regimen.  Call us if symptoms are not improved in 1-2 days.  Home Care:  Wash your hands often!  Do not wear your contacts until you complete your treatment plan.  Avoid sharing towels, bed linen, personal items with a person who has pink eye.  See attention for anyone in your home with similar symptoms.  Get Help Right Away If:  Your symptoms do not improve.  You develop blurred or loss of vision.  Your symptoms worsen (increased discharge, pain or redness)   Greater than 5 minutes, yet less than 10 minutes of time have been spent researching, coordinating, and implementing care for this patient today.  Inda Coke PA-C

## 2020-09-09 ENCOUNTER — Other Ambulatory Visit: Payer: Self-pay | Admitting: Family Medicine

## 2020-09-14 ENCOUNTER — Other Ambulatory Visit: Payer: Self-pay | Admitting: Family Medicine

## 2020-09-14 DIAGNOSIS — B351 Tinea unguium: Secondary | ICD-10-CM

## 2020-10-09 ENCOUNTER — Other Ambulatory Visit: Payer: Self-pay | Admitting: Family Medicine

## 2020-10-09 DIAGNOSIS — I10 Essential (primary) hypertension: Secondary | ICD-10-CM

## 2020-10-18 ENCOUNTER — Other Ambulatory Visit: Payer: Self-pay | Admitting: Family Medicine

## 2020-10-18 DIAGNOSIS — E559 Vitamin D deficiency, unspecified: Secondary | ICD-10-CM

## 2020-10-18 DIAGNOSIS — J0140 Acute pansinusitis, unspecified: Secondary | ICD-10-CM

## 2020-10-18 NOTE — Telephone Encounter (Signed)
Would you like Pt to continue Ergocalciferol?  

## 2020-11-14 ENCOUNTER — Other Ambulatory Visit: Payer: Self-pay | Admitting: Family Medicine

## 2020-11-17 ENCOUNTER — Encounter: Payer: BC Managed Care – PPO | Admitting: Family Medicine

## 2020-11-29 ENCOUNTER — Other Ambulatory Visit: Payer: Self-pay | Admitting: Family Medicine

## 2020-11-29 DIAGNOSIS — L819 Disorder of pigmentation, unspecified: Secondary | ICD-10-CM

## 2020-11-29 NOTE — Telephone Encounter (Signed)
Patient is checking the status of medication 

## 2020-12-20 ENCOUNTER — Telehealth (INDEPENDENT_AMBULATORY_CARE_PROVIDER_SITE_OTHER): Payer: Self-pay | Admitting: Family Medicine

## 2020-12-20 ENCOUNTER — Encounter: Payer: Self-pay | Admitting: Family Medicine

## 2020-12-20 VITALS — Ht 68.0 in | Wt 246.0 lb

## 2020-12-20 DIAGNOSIS — J0140 Acute pansinusitis, unspecified: Secondary | ICD-10-CM

## 2020-12-20 MED ORDER — AMOXICILLIN-POT CLAVULANATE 875-125 MG PO TABS: 1.0000 | ORAL_TABLET | Freq: Two times a day (BID) | ORAL | 0 refills | Status: DC

## 2020-12-20 MED ORDER — FLUTICASONE PROPIONATE 50 MCG/ACT NA SUSP: 2.0000 | Freq: Every day | NASAL | 6 refills | Status: DC

## 2020-12-20 NOTE — Progress Notes (Signed)
Virtual Visit via Video Note  I connected with Alfred Martin on 12/20/20 at 10:20 AM EST by a video enabled telemedicine application and verified that I am speaking with the correct person using two identifiers.  Location: Patient: home alone Provider: home    I discussed the limitations of evaluation and management by telemedicine and the availability of in person appointments. The patient expressed understanding and agreed to proceed.  History of Present Illness: Pt home c/o congestion, chills with fever  + cough   He has been tested 3 x for covid but never received results Pt has not had vaccine  Observations/Objective: There were no vitals filed for this visit. No fever today----   Had 101.3 Pt in nad   Assessment and Plan 1. Acute non-recurrent pansinusitis covid test pending -- symptoms for 1 week  abx and flonase' Call if symptoms do not improve and let us know covid test results  - amoxicillin-clavulanate (AUGMENTIN) 875-125 MG tablet; Take 1 tablet by mouth 2 (two) times daily.  Dispense: 20 tablet; Refill: 0 - fluticasone (FLONASE) 50 MCG/ACT nasal spray; Place 2 sprays into both nostrils daily.  Dispense: 16 g; Refill: 6  Follow Up Instructions:    I discussed the assessment and treatment plan with the patient. The patient was provided an opportunity to ask questions and all were answered. The patient agreed with the plan and demonstrated an understanding of the instructions.   The patient was advised to call back or seek an in-person evaluation if the symptoms worsen or if the condition fails to improve as anticipated.  I provided 25 minutes of non-face-to-face time during this encounter.   [name], DO

## 2021-01-11 ENCOUNTER — Other Ambulatory Visit: Payer: Self-pay | Admitting: Family Medicine

## 2021-02-17 ENCOUNTER — Other Ambulatory Visit: Payer: Self-pay | Admitting: Family Medicine

## 2021-02-17 DIAGNOSIS — I10 Essential (primary) hypertension: Secondary | ICD-10-CM

## 2021-03-22 ENCOUNTER — Other Ambulatory Visit: Payer: Self-pay | Admitting: Family Medicine

## 2021-03-22 DIAGNOSIS — J0140 Acute pansinusitis, unspecified: Secondary | ICD-10-CM

## 2021-03-26 ENCOUNTER — Encounter: Payer: BC Managed Care – PPO | Admitting: Family Medicine

## 2021-04-02 ENCOUNTER — Other Ambulatory Visit: Payer: Self-pay

## 2021-04-03 ENCOUNTER — Other Ambulatory Visit: Payer: Self-pay

## 2021-04-03 ENCOUNTER — Ambulatory Visit (INDEPENDENT_AMBULATORY_CARE_PROVIDER_SITE_OTHER): Payer: BC Managed Care – PPO | Admitting: Family Medicine

## 2021-04-03 ENCOUNTER — Encounter: Payer: Self-pay | Admitting: Family Medicine

## 2021-04-03 ENCOUNTER — Telehealth: Payer: Self-pay | Admitting: *Deleted

## 2021-04-03 VITALS — BP 122/90 | HR 91 | Temp 98.6°F | Resp 18 | Ht 68.0 in | Wt 232.2 lb

## 2021-04-03 DIAGNOSIS — Z0000 Encounter for general adult medical examination without abnormal findings: Secondary | ICD-10-CM

## 2021-04-03 DIAGNOSIS — K649 Unspecified hemorrhoids: Secondary | ICD-10-CM

## 2021-04-03 DIAGNOSIS — R42 Dizziness and giddiness: Secondary | ICD-10-CM

## 2021-04-03 DIAGNOSIS — R195 Other fecal abnormalities: Secondary | ICD-10-CM

## 2021-04-03 DIAGNOSIS — R9431 Abnormal electrocardiogram [ECG] [EKG]: Secondary | ICD-10-CM

## 2021-04-03 DIAGNOSIS — E1165 Type 2 diabetes mellitus with hyperglycemia: Secondary | ICD-10-CM

## 2021-04-03 DIAGNOSIS — Z1159 Encounter for screening for other viral diseases: Secondary | ICD-10-CM

## 2021-04-03 DIAGNOSIS — E559 Vitamin D deficiency, unspecified: Secondary | ICD-10-CM

## 2021-04-03 DIAGNOSIS — E1169 Type 2 diabetes mellitus with other specified complication: Secondary | ICD-10-CM

## 2021-04-03 DIAGNOSIS — Z1211 Encounter for screening for malignant neoplasm of colon: Secondary | ICD-10-CM

## 2021-04-03 DIAGNOSIS — R350 Frequency of micturition: Secondary | ICD-10-CM

## 2021-04-03 DIAGNOSIS — I10 Essential (primary) hypertension: Secondary | ICD-10-CM | POA: Diagnosis not present

## 2021-04-03 DIAGNOSIS — E785 Hyperlipidemia, unspecified: Secondary | ICD-10-CM

## 2021-04-03 DIAGNOSIS — Z0001 Encounter for general adult medical examination with abnormal findings: Secondary | ICD-10-CM | POA: Diagnosis not present

## 2021-04-03 HISTORY — DX: Other fecal abnormalities: R19.5

## 2021-04-03 HISTORY — DX: Frequency of micturition: R35.0

## 2021-04-03 HISTORY — DX: Unspecified hemorrhoids: K64.9

## 2021-04-03 LAB — POC URINALSYSI DIPSTICK (AUTOMATED)
Bilirubin, UA: NEGATIVE
Blood, UA: NEGATIVE
Glucose, UA: POSITIVE — AB
Leukocytes, UA: NEGATIVE
Nitrite, UA: NEGATIVE
Protein, UA: NEGATIVE
Spec Grav, UA: 1.015 (ref 1.010–1.025)
Urobilinogen, UA: 0.2 E.U./dL
pH, UA: 5 (ref 5.0–8.0)

## 2021-04-03 LAB — CBC WITH DIFFERENTIAL/PLATELET
Basophils Absolute: 0.1 10*3/uL (ref 0.0–0.1)
Basophils Relative: 1 % (ref 0.0–3.0)
Eosinophils Absolute: 0.1 10*3/uL (ref 0.0–0.7)
Eosinophils Relative: 1.3 % (ref 0.0–5.0)
HCT: 45.5 % (ref 39.0–52.0)
Hemoglobin: 14.8 g/dL (ref 13.0–17.0)
Lymphocytes Relative: 44.5 % (ref 12.0–46.0)
Lymphs Abs: 2.8 10*3/uL (ref 0.7–4.0)
MCHC: 32.5 g/dL (ref 30.0–36.0)
MCV: 85.4 fl (ref 78.0–100.0)
Monocytes Absolute: 0.7 10*3/uL (ref 0.1–1.0)
Monocytes Relative: 10.4 % (ref 3.0–12.0)
Neutro Abs: 2.7 10*3/uL (ref 1.4–7.7)
Neutrophils Relative %: 42.8 % — ABNORMAL LOW (ref 43.0–77.0)
Platelets: 170 10*3/uL (ref 150.0–400.0)
RBC: 5.33 Mil/uL (ref 4.22–5.81)
RDW: 14 % (ref 11.5–15.5)
WBC: 6.4 10*3/uL (ref 4.0–10.5)

## 2021-04-03 LAB — HEMOCCULT GUIAC POC 1CARD (OFFICE): Fecal Occult Blood, POC: POSITIVE — AB

## 2021-04-03 LAB — PSA: PSA: 1.58 ng/mL (ref 0.10–4.00)

## 2021-04-03 LAB — MICROALBUMIN / CREATININE URINE RATIO
Creatinine,U: 67 mg/dL
Microalb Creat Ratio: 1.7 mg/g (ref 0.0–30.0)
Microalb, Ur: 1.1 mg/dL (ref 0.0–1.9)

## 2021-04-03 LAB — LIPID PANEL
Cholesterol: 196 mg/dL (ref 0–200)
HDL: 48.6 mg/dL (ref >39.00)
LDL Cholesterol: 116 mg/dL — ABNORMAL HIGH (ref 0–99)
NonHDL: 147.73
Total CHOL/HDL Ratio: 4
Triglycerides: 159 mg/dL — ABNORMAL HIGH (ref 0.0–149.0)
VLDL: 31.8 mg/dL (ref 0.0–40.0)

## 2021-04-03 LAB — COMPREHENSIVE METABOLIC PANEL
ALT: 76 U/L — ABNORMAL HIGH (ref 0–53)
AST: 34 U/L (ref 0–37)
Albumin: 4.6 g/dL (ref 3.5–5.2)
Alkaline Phosphatase: 40 U/L (ref 39–117)
BUN: 31 mg/dL — ABNORMAL HIGH (ref 6–23)
CO2: 28 mEq/L (ref 19–32)
Calcium: 11 mg/dL — ABNORMAL HIGH (ref 8.4–10.5)
Chloride: 95 mEq/L — ABNORMAL LOW (ref 96–112)
Creatinine, Ser: 1.49 mg/dL (ref 0.40–1.50)
GFR: 52.25 mL/min — ABNORMAL LOW (ref >60.00)
Glucose, Bld: 515 mg/dL (ref 70–99)
Potassium: 4.6 mEq/L (ref 3.5–5.1)
Sodium: 133 mEq/L — ABNORMAL LOW (ref 135–145)
Total Bilirubin: 0.7 mg/dL (ref 0.2–1.2)
Total Protein: 7.5 g/dL (ref 6.0–8.3)

## 2021-04-03 LAB — HEMOGLOBIN A1C: Hgb A1c MFr Bld: 13.8 % — ABNORMAL HIGH (ref 4.6–6.5)

## 2021-04-03 LAB — VITAMIN D 25 HYDROXY (VIT D DEFICIENCY, FRACTURES): VITD: 48.95 ng/mL (ref 30.00–100.00)

## 2021-04-03 LAB — TSH: TSH: 1.47 u[IU]/mL (ref 0.35–4.50)

## 2021-04-03 MED ORDER — ROSUVASTATIN CALCIUM 40 MG PO TABS: 40.0000 mg | ORAL_TABLET | Freq: Every day | ORAL | 0 refills | Status: DC

## 2021-04-03 MED ORDER — HYDROCORT-PRAMOXINE (PERIANAL) 1-1 % EX FOAM: 1.0000 | Freq: Two times a day (BID) | CUTANEOUS | 2 refills | Status: DC

## 2021-04-03 MED ORDER — INSULIN DEGLUDEC 100 UNIT/ML ~~LOC~~ SOPN: 10.0000 [IU] | PEN_INJECTOR | Freq: Every day | SUBCUTANEOUS | 0 refills | Status: DC

## 2021-04-03 MED ORDER — BLOOD GLUCOSE MONITOR KIT: PACK | 0 refills | Status: DC

## 2021-04-03 NOTE — Assessment & Plan Note (Signed)
Refer to gi May be from hemorrhoids

## 2021-04-03 NOTE — Telephone Encounter (Signed)
Opened in error

## 2021-04-03 NOTE — Assessment & Plan Note (Signed)
ghm utd Check labs  Colonoscopy ordered

## 2021-04-03 NOTE — Progress Notes (Signed)
Subjective:   By signing my name below, I, [name], attest that this documentation has been prepared under the direction and in the presence of Dr. [name], DO. 04/03/2021     Patient ID: Alfred Martin, male    DOB: 10-12-1966, 55 y.o.   MRN: [id number]  Chief Complaint  Patient presents with  . Annual Exam    Pt states fasting     HPI Patient is in today for a comprehensive physical exam. He is complaning of frequent urination. He mentions having progressively less control over his urination since his birthday in [birth year].  He is also interested in getting a stress test to evaluate his heart health because of recently experiencing dizziness during events such as laughing. He walks for 3-4 miles daily but does not experience any dizziness from this. He also reports that he has had further loss in vision. He is not UTD on vision. He denies having any shortness of breath, and chest pain, fever, chills, fatigue, ear pain, congestions, sinus pain, sore throat, eye pain, leg swelling, palpitations, cough, nausea, vomiting.Marland Kitchen He is managing his hypertension by taking 25 mg metoprolol succinate. He has stopped taking 125 mg Vitamin D daily PO. He continues to take 5 mg xyzal, and flonase to manage his allergies. He has stopped going to his dermatologist. He is not UTD on Covid 19 vaccinations. He is interested making an appointment for a colonoscopy.   Past Medical History:  Diagnosis Date  . Allergy   . CHF (congestive heart failure) ([place])   . Gout   . HTN (hypertension)   . Hyperlipidemia    diet controlled and exercise  . Joint pain     Past Surgical History:  Procedure Laterality Date  . TONSILLECTOMY      Family History  Problem Relation Age of Onset  . Arthritis Mother        Severe-- RA  . Diabetes Mother   . Hyperlipidemia Mother   . High blood pressure Mother   . Hypertension Mother   . Obesity Mother   . Colon cancer Paternal Uncle   . Congestive Heart  Failure Paternal Uncle   . Stroke Paternal Uncle   . Diabetes Father   . Hyperlipidemia Father   . High blood pressure Father   . Hypertension Father   . Liver disease Father   . Diabetes Brother   . Hypertension Brother   . HIV/AIDS Brother   . Breast cancer Maternal Aunt   . Arthritis Maternal Aunt        RA  . Hypertension Brother   . Drug abuse Brother        chf-- crack / cocaine  . Congestive Heart Failure Brother   . Arthritis Maternal Aunt   . Arthritis Maternal Aunt   . Heart disease Maternal Grandmother   . Breast cancer Paternal Grandmother   . Colon polyps Neg Hx   . Rectal cancer Neg Hx   . Stomach cancer Neg Hx     Social History   Socioeconomic History  . Marital status: Married    Spouse name: [name]  . Number of children: Not on file  . Years of education: Not on file  . Highest education level: Not on file  Occupational History  . Occupation: high school Engineer, maintenance (IT): [employer]    Comment: Psychologist, prison and probation services  Tobacco Use  . Smoking status: Never Smoker  . Smokeless tobacco: Never Used  Vaping Use  .  Vaping Use: Never used  Substance and Sexual Activity  . Alcohol use: No    Alcohol/week: 0.0 standard drinks  . Drug use: No  . Sexual activity: Yes  Other Topics Concern  . 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 allopurinol (ZYLOPRIM) 300 MG tablet TAKE 1 TABLET BY MOUTH EVERY DAY 90 tablet 2  . amLODipine-benazepril (LOTREL) 10-40 MG capsule Take 1 capsule by mouth daily. 90 capsule 1  . fluticasone (FLONASE) 50  MCG/ACT nasal spray Place 2 sprays into both nostrils daily. 48 mL 3  . fluticasone (FLONASE) 50 MCG/ACT nasal spray Place 2 sprays into both nostrils daily. 16 g 6  . hydrochlorothiazide (HYDRODIURIL) 25 MG tablet TAKE 1 TABLET BY MOUTH EVERY DAY 90 tablet 1  . hydroquinone 4 % cream APPLY TO AFFECTED AREA TWICE A DAY 28.35 g 1  . levocetirizine (XYZAL) 5 MG tablet TAKE 1 TABLET BY MOUTH EVERY DAY IN THE EVENING 90 tablet 0  . metoprolol succinate (TOPROL-XL) 25 MG 24 hr tablet TAKE 1 TABLET BY MOUTH EVERY DAY 90 tablet 0  . rosuvastatin (CRESTOR) 20 MG tablet TAKE 1 TABLET BY MOUTH EVERY DAY 90 tablet 1  . sildenafil (VIAGRA) 100 MG tablet Take 0.5-1 tablets (50-100 mg total) by mouth daily as needed for erectile dysfunction. 10 tablet 11  . Vitamin D, Ergocalciferol, (DRISDOL) 1.25 MG (50000 UNIT) CAPS capsule TAKE 1 CAPSULE (50,000 UNITS TOTAL) BY MOUTH EVERY 7 (SEVEN) DAYS. 12 capsule 1  . ciclopirox (PENLAC) 8 % solution APPLY TOPICALLY AT BEDTIME. APPLY OVER NAIL AND SURROUNDING SKIN. APPLY DAILY OVER PREVIOUS COAT. AFTER SEVEN (7) DAYS, MAY REMOVE WITH ALCOHOL AND CONTINUE CYCLE. 6.6 mL 2  . clotrimazole-betamethasone (LOTRISONE) cream APPLY TO AFFECTED AREA TWICE A DAY 30 g 1  . trimethoprim-polymyxin b (POLYTRIM) ophthalmic solution Place 1 drop into the left eye every 6 (six) hours. 10 mL 0  . amoxicillin-clavulanate (AUGMENTIN) 875-125 MG tablet Take 1 tablet by mouth 2 (two) times daily. (Patient not taking: Reported on 04/03/2021) 20 tablet 0   No facility-administered medications prior to visit.    No Known Allergies  Review of Systems  Constitutional: Negative for chills, fever and malaise/fatigue.  HENT: Negative for congestion, ear pain, sinus pain and sore throat.   Eyes: Negative for pain.  Respiratory: Negative for cough, shortness of breath and wheezing.   Cardiovascular: Negative for chest pain and leg swelling.  Gastrointestinal: Negative for nausea and vomiting.   Genitourinary: Positive for frequency. Negative for dysuria and hematuria.  Neurological: Negative for headaches.       Objective:    Physical Exam Constitutional:      Appearance: Normal appearance.  HENT:     Head: Normocephalic and atraumatic.     Right Ear: Tympanic membrane and external ear normal.     Left Ear: Tympanic membrane and external ear normal.  Eyes:     Extraocular Movements: Extraocular movements intact.     Pupils: Pupils are equal, round, and reactive to light.  Cardiovascular:  Rate and Rhythm: Normal rate and regular rhythm.     Pulses: Normal pulses.     Heart sounds: Normal heart sounds.  Pulmonary:     Effort: Pulmonary effort is normal. No respiratory distress.     Breath sounds: Normal breath sounds. No stridor. No wheezing, rhonchi or rales.  Abdominal:     General: Bowel sounds are normal. There is no distension.     Palpations: Abdomen is soft. There is no mass.     Tenderness: There is no abdominal tenderness. There is no guarding or rebound.  Genitourinary:    Rectum: Guaiac result positive.  Skin:    General: Skin is warm and dry.  Neurological:     Mental Status: He is alert and oriented to person, place, and time.  Psychiatric:        Behavior: Behavior normal.     BP 122/90 (BP Location: Right Arm, Patient Position: Sitting, Cuff Size: Normal)   Pulse 91   Temp 98.6 F (37 C) (Oral)   Resp 18   Ht '5\' 8"'  (1.727 m)   Wt 232 lb 3.2 oz (105.3 kg)   SpO2 97%   BMI 35.31 kg/m  Wt Readings from Last 3 Encounters:  04/03/21 232 lb 3.2 oz (105.3 kg)  12/20/20 246 lb (111.6 kg)  06/02/20 251 lb (113.9 kg)    Diabetic Foot Exam - Simple   No data filed    Lab Results  Component Value Date   WBC 6.4 04/03/2021   HGB 14.8 04/03/2021   HCT 45.5 04/03/2021   PLT 170.0 04/03/2021   GLUCOSE 515 (HH) 04/03/2021   CHOL 196 04/03/2021   TRIG 159.0 (H) 04/03/2021   HDL 48.60 04/03/2021   LDLDIRECT 127.0 03/18/2019   LDLCALC 116  (H) 04/03/2021   ALT 76 (H) 04/03/2021   AST 34 04/03/2021   NA 133 (L) 04/03/2021   K 4.6 04/03/2021   CL 95 (L) 04/03/2021   CREATININE 1.49 04/03/2021   BUN 31 (H) 04/03/2021   CO2 28 04/03/2021   TSH 1.47 04/03/2021   PSA 1.58 04/03/2021   HGBA1C 13.8 (H) 04/03/2021   MICROALBUR 0.8 09/21/2019    Lab Results  Component Value Date   TSH 1.47 04/03/2021   Lab Results  Component Value Date   WBC 6.4 04/03/2021   HGB 14.8 04/03/2021   HCT 45.5 04/03/2021   MCV 85.4 04/03/2021   PLT 170.0 04/03/2021   Lab Results  Component Value Date   NA 133 (L) 04/03/2021   K 4.6 04/03/2021   CO2 28 04/03/2021   GLUCOSE 515 (HH) 04/03/2021   BUN 31 (H) 04/03/2021   CREATININE 1.49 04/03/2021   BILITOT 0.7 04/03/2021   ALKPHOS 40 04/03/2021   AST 34 04/03/2021   ALT 76 (H) 04/03/2021   PROT 7.5 04/03/2021   ALBUMIN 4.6 04/03/2021   CALCIUM 11.0 (H) 04/03/2021   ANIONGAP 11 07/17/2018   GFR 52.25 (L) 04/03/2021   Lab Results  Component Value Date   CHOL 196 04/03/2021   Lab Results  Component Value Date   HDL 48.60 04/03/2021   Lab Results  Component Value Date   LDLCALC 116 (H) 04/03/2021   Lab Results  Component Value Date   TRIG 159.0 (H) 04/03/2021   Lab Results  Component Value Date   CHOLHDL 4 04/03/2021   Lab Results  Component Value Date   HGBA1C 13.8 (H) 04/03/2021   Colonoscopy- Not yet completed  ekg-- nonsp t wave  changes -- compared to 9/19 Assessment & Plan:   Problem List Items Addressed This Visit      Unprioritized   Guaiac positive stools    Refer to gi May be from hemorrhoids       Relevant Orders   Ambulatory referral to Gastroenterology   Hemorrhoids    Proctofoam and f/u GI      Relevant Medications   hydrocortisone-pramoxine (PROCTOFOAM-HC) rectal foam   Other Relevant Orders   Ambulatory referral to Gastroenterology   HTN (hypertension)    Well controlled, no changes to meds. Encouraged heart healthy diet such as the  DASH diet and exercise as tolerated.       Relevant Orders   Lipid panel (Completed)   Hemoglobin A1c (Completed)   CBC with Differential/Platelet (Completed)   TSH (Completed)   PSA (Completed)   Comprehensive metabolic panel (Completed)   ECHOCARDIOGRAM COMPLETE   Hyperlipidemia LDL goal <100    Tolerating statin, encouraged heart healthy diet, avoid trans fats, minimize simple carbs and saturated fats. Increase exercise as tolerated      Preventative health care - Primary    ghm utd Check labs  Colonoscopy ordered       Uncontrolled type 2 diabetes mellitus with hyperglycemia ([place])    Check labs  hgba1c to be checked , minimize simple carbs. Increase exercise as tolerated. Continue current meds       Relevant Medications   blood glucose meter kit and supplies KIT   Other Relevant Orders   Microalbumin / creatinine urine ratio   Urinary frequency    Check ua and psa   Check labs       Relevant Orders   POCT Urinalysis Dipstick (Automated) (Completed)    Other Visit Diagnoses    Colon cancer screening       Relevant Orders   Ambulatory referral to Gastroenterology   Need for hepatitis C screening test       Relevant Orders   Hepatitis C antibody   Hyperlipidemia associated with type 2 diabetes mellitus ([place])       Vitamin D deficiency       Relevant Orders   Vitamin D (25 hydroxy) (Completed)   Dizzy       Relevant Orders   EKG 12-Lead (Completed)   Ambulatory referral to Cardiology   ECHOCARDIOGRAM COMPLETE   Cardiac event monitor   Encounter for preventative adult health care exam with abnormal findings       Relevant Orders   POCT Occult Blood Stool (Completed)   Abnormal EKG       Relevant Orders   Cardiac event monitor       Meds ordered this encounter  Medications  . blood glucose meter kit and supplies KIT    Sig: Dispense based on patient and insurance preference. Use up to four times daily as directed.    Dispense:  1 each    Refill:   0    Order Specific Question:   Number of strips    Answer:   100    Order Specific Question:   Number of lancets    Answer:   100  . hydrocortisone-pramoxine (PROCTOFOAM-HC) rectal foam    Sig: Place 1 applicator rectally 2 (two) times daily.    Dispense:  10 g    Refill:  2    I, [name], DO, personally preformed the services described in this documentation.  All medical record entries made by the scribe were  at my direction and in my presence.  I have reviewed the chart and discharge instructions (if applicable) and agree that the record reflects my personal performance and is accurate and complete. 04/03/2021   I,[name],acting as a scribe for [name], DO.,have documented all relevant documentation on the behalf of [name], DO,as directed by  [name], DO while in the presence of [name], DO.   [name], DO

## 2021-04-03 NOTE — Assessment & Plan Note (Signed)
Well controlled, no changes to meds. Encouraged heart healthy diet such as the DASH diet and exercise as tolerated.  °

## 2021-04-03 NOTE — Patient Instructions (Signed)

## 2021-04-03 NOTE — Assessment & Plan Note (Signed)
Check labs  hgba1c to be checked, minimize simple carbs. Increase exercise as tolerated. Continue current meds  

## 2021-04-03 NOTE — Assessment & Plan Note (Signed)
Tolerating statin, encouraged heart healthy diet, avoid trans fats, minimize simple carbs and saturated fats. Increase exercise as tolerated 

## 2021-04-03 NOTE — Assessment & Plan Note (Signed)
Proctofoam and f/u GI

## 2021-04-03 NOTE — Telephone Encounter (Signed)
CRITICAL VALUE STICKER  CRITICAL VALUE: glucose -- 515  RECEIVER (on-site recipient of call): Kelle Darting, [place] NOTIFIED:  04/03/21 @ 12:16pm  MESSENGER (representative from lab):  [place]  MD NOTIFIED: Carollee Herter  TIME OF NOTIFICATION: 12:18pm  RESPONSE:

## 2021-04-03 NOTE — Telephone Encounter (Signed)
Pt is aware and scheduled for lab appointment.

## 2021-04-03 NOTE — Assessment & Plan Note (Signed)
Check ua and psa   Check labs

## 2021-04-03 NOTE — Telephone Encounter (Signed)
Please call pt and see labs -- he needs to start med today and repeat lab this week

## 2021-04-04 LAB — HEPATITIS C ANTIBODY
Hepatitis C Ab: NONREACTIVE
SIGNAL TO CUT-OFF: 0.01 (ref <1.00)

## 2021-04-05 NOTE — Addendum Note (Signed)
Addended by: Sanda Linger on: 04/05/2021 01:52 PM   Modules accepted: Orders

## 2021-04-06 ENCOUNTER — Other Ambulatory Visit (INDEPENDENT_AMBULATORY_CARE_PROVIDER_SITE_OTHER): Payer: BC Managed Care – PPO

## 2021-04-06 ENCOUNTER — Other Ambulatory Visit: Payer: Self-pay

## 2021-04-06 DIAGNOSIS — E1165 Type 2 diabetes mellitus with hyperglycemia: Secondary | ICD-10-CM | POA: Diagnosis not present

## 2021-04-06 LAB — COMPREHENSIVE METABOLIC PANEL
ALT: 77 U/L — ABNORMAL HIGH (ref 0–53)
AST: 50 U/L — ABNORMAL HIGH (ref 0–37)
Albumin: 4.1 g/dL (ref 3.5–5.2)
Alkaline Phosphatase: 34 U/L — ABNORMAL LOW (ref 39–117)
BUN: 25 mg/dL — ABNORMAL HIGH (ref 6–23)
CO2: 24 mEq/L (ref 19–32)
Calcium: 9.3 mg/dL (ref 8.4–10.5)
Chloride: 100 mEq/L (ref 96–112)
Creatinine, Ser: 1.13 mg/dL (ref 0.40–1.50)
GFR: 72.82 mL/min (ref >60.00)
Glucose, Bld: 268 mg/dL — ABNORMAL HIGH (ref 70–99)
Potassium: 4.3 mEq/L (ref 3.5–5.1)
Sodium: 134 mEq/L — ABNORMAL LOW (ref 135–145)
Total Bilirubin: 0.8 mg/dL (ref 0.2–1.2)
Total Protein: 6.8 g/dL (ref 6.0–8.3)

## 2021-04-07 ENCOUNTER — Other Ambulatory Visit: Payer: Self-pay | Admitting: Family Medicine

## 2021-04-07 DIAGNOSIS — E559 Vitamin D deficiency, unspecified: Secondary | ICD-10-CM

## 2021-04-09 ENCOUNTER — Telehealth: Payer: Self-pay | Admitting: Family Medicine

## 2021-04-09 ENCOUNTER — Telehealth: Payer: Self-pay

## 2021-04-09 DIAGNOSIS — E1165 Type 2 diabetes mellitus with hyperglycemia: Secondary | ICD-10-CM

## 2021-04-09 MED ORDER — LANCETS MISC: 1 refills | Status: DC

## 2021-04-09 NOTE — Telephone Encounter (Signed)
Caller states Dr. Etter Sjogren ordered a diabetic test kit for him but he did not order any diabetic needles for him to prick himself . Caller states he is needing an order for the needles sent to the pharmacy .

## 2021-04-09 NOTE — Telephone Encounter (Signed)
Pt, called he would like a prescription for the needles for his blood glucose meter   Please advice

## 2021-04-09 NOTE — Telephone Encounter (Signed)
Rx sent 

## 2021-04-09 NOTE — Telephone Encounter (Signed)
Duplicate message. 

## 2021-04-10 ENCOUNTER — Encounter: Payer: Self-pay | Admitting: Family Medicine

## 2021-04-10 NOTE — Telephone Encounter (Signed)
See below. Also pt reports taking 15 units of Tresiba this morning after checking glucose.

## 2021-04-12 ENCOUNTER — Other Ambulatory Visit: Payer: Self-pay | Admitting: Family Medicine

## 2021-04-12 DIAGNOSIS — I10 Essential (primary) hypertension: Secondary | ICD-10-CM

## 2021-04-12 NOTE — Telephone Encounter (Signed)
Lets add the rybelsus then--- I think we have a coupon in the office so he can get 1 month free

## 2021-04-12 NOTE — Telephone Encounter (Signed)
No we can wait since he is seeing endo tomorrow

## 2021-04-13 MED ORDER — RYBELSUS 3 MG PO TABS
3.0000 mg | ORAL_TABLET | Freq: Every day | ORAL | 0 refills | Status: DC
Start: 2021-04-13 — End: 2021-05-01

## 2021-04-13 NOTE — Telephone Encounter (Signed)
We can hold off for now.

## 2021-04-16 ENCOUNTER — Encounter: Payer: Self-pay | Admitting: Family Medicine

## 2021-04-16 NOTE — Telephone Encounter (Signed)
Is the sugar running too low?  Is that what is causing the dizziness?

## 2021-04-25 ENCOUNTER — Other Ambulatory Visit: Payer: Self-pay | Admitting: Family Medicine

## 2021-04-27 ENCOUNTER — Encounter: Payer: Self-pay | Admitting: Gastroenterology

## 2021-05-01 ENCOUNTER — Other Ambulatory Visit: Payer: Self-pay

## 2021-05-01 ENCOUNTER — Encounter: Payer: Self-pay | Admitting: Internal Medicine

## 2021-05-01 ENCOUNTER — Ambulatory Visit: Payer: BC Managed Care – PPO | Admitting: Internal Medicine

## 2021-05-01 VITALS — BP 126/86 | HR 83 | Ht 68.0 in | Wt 234.4 lb

## 2021-05-01 DIAGNOSIS — E785 Hyperlipidemia, unspecified: Secondary | ICD-10-CM | POA: Diagnosis not present

## 2021-05-01 DIAGNOSIS — E1165 Type 2 diabetes mellitus with hyperglycemia: Secondary | ICD-10-CM | POA: Diagnosis not present

## 2021-05-01 LAB — POCT GLUCOSE (DEVICE FOR HOME USE): POC Glucose: 106 mg/dl — AB (ref 70–99)

## 2021-05-01 NOTE — Patient Instructions (Signed)
-   Keep Up the Good Work ! 

## 2021-05-01 NOTE — Progress Notes (Signed)
Name: Alfred Martin  MRN/ DOB: [id number], [birth year]   Age/ Sex: 55 y.o., male    PCP: [name], DO   Reason for Endocrinology Evaluation: Type 2 Diabetes Mellitus     Date of Initial Endocrinology Visit: 05/01/2021     PATIENT IDENTIFIER: Mr. Alfred Martin is a 55 y.o. male with a past medical history of HTN, T2DM and cardiomyopathy. The patient presented for initial endocrinology clinic visit on 05/01/2021 for consultative assistance with his diabetes management.    HPI: Mr. Alfred Martin was    Diagnosed with DM 2020 Prior Medications tried/Intolerance: N/A Currently checking blood sugars 4 x / day Hypoglycemia episodes : no  Hemoglobin A1c has ranged from 6.5% in 2020, peaking at 13.8% in 2022. Patient required assistance for hypoglycemia: no Patient has required hospitalization within the last 1 year from hyper or hypoglycemia:   In terms of diet, the patient eats 3 meals a day   He was prescribed Tresiba and Rybelsus but due to severe blurry vision he stopped 04/18/2021   He admits to drinking excessive shakes and half gallons of orange juice but has changed his lifestyle and started walking 6 miles a day.       HOME DIABETES REGIMEN: Alfred Martin - not taking  Rybelsus - not  taking      Statin: yes ACE-I/ARB: yes Prior Diabetic Education: no    METER DOWNLOAD SUMMARY: Did not bring  Memory recall < 111 mg/dL   DIABETIC COMPLICATIONS: Microvascular complications:    Denies: CKD, retinopathy, neuropathy  Last eye exam: Completed   Macrovascular complications:    Denies: CAD, PVD, CVA   PAST HISTORY: Past Medical History:  Past Medical History:  Diagnosis Date  . Allergy   . CHF (congestive heart failure) ([place])   . Gout   . HTN (hypertension)   . Hyperlipidemia    diet controlled and exercise  . Joint pain    Past Surgical History:  Past Surgical History:  Procedure Laterality Date  . TONSILLECTOMY        Social History:  reports that he  has never smoked. He has never used smokeless tobacco. He reports that he does not drink alcohol and does not use drugs. Family History:  Family History  Problem Relation Age of Onset  . Arthritis Mother        Severe-- RA  . Diabetes Mother   . Hyperlipidemia Mother   . High blood pressure Mother   . Hypertension Mother   . Obesity Mother   . Colon cancer Paternal Uncle   . Congestive Heart Failure Paternal Uncle   . Stroke Paternal Uncle   . Diabetes Father   . Hyperlipidemia Father   . High blood pressure Father   . Hypertension Father   . Liver disease Father   . Diabetes Brother   . Hypertension Brother   . HIV/AIDS Brother   . Breast cancer Maternal Aunt   . Arthritis Maternal Aunt        RA  . Hypertension Brother   . Drug abuse Brother        chf-- crack / cocaine  . Congestive Heart Failure Brother   . Arthritis Maternal Aunt   . Arthritis Maternal Aunt   . Heart disease Maternal Grandmother   . Breast cancer Paternal Grandmother   . Colon polyps Neg Hx   . Rectal cancer Neg Hx   . Stomach cancer Neg Hx      HOME MEDICATIONS: Allergies as  of 05/01/2021   No Known Allergies     Medication List       Accurate as of May 01, 2021  1:09 PM. If you have any questions, ask your nurse or doctor.        Accu-Chek Guide test strip Generic drug: glucose blood USE AS DIRECTED UP TO 4 TIMES A DAY TO CHECK BLOOD SUGAR   allopurinol 300 MG tablet Commonly known as: ZYLOPRIM TAKE 1 TABLET BY MOUTH EVERY DAY   amLODipine-benazepril 10-40 MG capsule Commonly known as: LOTREL Take 1 capsule by mouth daily.   blood glucose meter kit and supplies Kit Dispense based on patient and insurance preference. Use up to four times daily as directed.   fluticasone 50 MCG/ACT nasal spray Commonly known as: FLONASE Place 2 sprays into both nostrils daily.   fluticasone 50 MCG/ACT nasal spray Commonly known as: FLONASE Place 2 sprays into both nostrils daily.    hydrochlorothiazide 25 MG tablet Commonly known as: HYDRODIURIL TAKE 1 TABLET BY MOUTH EVERY DAY   hydrocortisone-pramoxine rectal foam Commonly known as: PROCTOFOAM-HC Place 1 applicator rectally 2 (two) times daily.   hydroquinone 4 % cream APPLY TO AFFECTED AREA TWICE A DAY   insulin degludec 100 UNIT/ML FlexTouch Pen Commonly known as: TRESIBA Inject 10 Units into the skin daily.   Lancets Misc Use to check glucose three times daily   levocetirizine 5 MG tablet Commonly known as: XYZAL TAKE 1 TABLET BY MOUTH EVERY DAY IN THE EVENING   metoprolol succinate 25 MG 24 hr tablet Commonly known as: TOPROL-XL TAKE 1 TABLET BY MOUTH EVERY DAY   rosuvastatin 40 MG tablet Commonly known as: CRESTOR Take 1 tablet (40 mg total) by mouth daily.   Rybelsus 3 MG Tabs Generic drug: Semaglutide Take 3 mg by mouth daily.   sildenafil 100 MG tablet Commonly known as: VIAGRA Take 0.5-1 tablets (50-100 mg total) by mouth daily as needed for erectile dysfunction.   Vitamin D (Ergocalciferol) 1.25 MG (50000 UNIT) Caps capsule Commonly known as: DRISDOL TAKE 1 CAPSULE (50,000 UNITS TOTAL) BY MOUTH EVERY 7 (SEVEN) DAYS.        ALLERGIES: No Known Allergies   REVIEW OF SYSTEMS: A comprehensive ROS was conducted with the patient and is negative except as per HPI and below:  Review of Systems  Eyes: Positive for blurred vision.  Gastrointestinal: Negative for diarrhea and nausea.      OBJECTIVE:   VITAL SIGNS: BP 126/86   Pulse 83   Ht 5' 8"  (1.727 m)   Wt 234 lb 6.4 oz (106.3 kg)   SpO2 98%   BMI 35.64 kg/m    PHYSICAL EXAM:  General: Pt appears well and is in NAD  Neck: General: Supple without adenopathy or carotid bruits. Thyroid: Thyroid size normal.  No goiter or nodules appreciated.  Lungs: Clear with good BS bilat with no rales, rhonchi, or wheezes  Heart: RRR with normal S1 and S2 and no gallops; no murmurs; no rub  Abdomen: Normoactive bowel sounds, soft,  nontender, without masses or organomegaly palpable  Extremities:  Lower extremities - No pretibial edema. No lesions.  Skin: No rash noted. + Acanthosis nigricans/skin tags.  Neuro: MS is good with appropriate affect, pt is alert and Ox3    DM foot exam: 05/01/2021  The skin of the feet is intact without sores or ulcerations. The pedal pulses are 2+ on right and 2+ on left. The sensation is intact to a screening 5.07, 10 gram  monofilament bilaterally   DATA REVIEWED:  Lab Results  Component Value Date   HGBA1C 13.8 (H) 04/03/2021   HGBA1C 6.6 (H) 09/21/2019   HGBA1C 6.5 06/22/2019   Lab Results  Component Value Date   MICROALBUR 1.1 04/03/2021   LDLCALC 116 (H) 04/03/2021   CREATININE 1.13 04/06/2021   Lab Results  Component Value Date   MICRALBCREAT 1.7 04/03/2021    Lab Results  Component Value Date   CHOL 196 04/03/2021   HDL 48.60 04/03/2021   LDLCALC 116 (H) 04/03/2021   LDLDIRECT 127.0 03/18/2019   TRIG 159.0 (H) 04/03/2021   CHOLHDL 4 04/03/2021        ASSESSMENT / PLAN / RECOMMENDATIONS:   1) Type 2  Diabetes Mellitus, Poorly controlled, Without complications - Most recent A1c of 13.8 %. Goal A1c < 7.0 %.       GENERAL: I have discussed with the patient the pathophysiology of diabetes. We went over the natural progression of the disease. We talked about both insulin resistance and insulin deficiency. We stressed the importance of lifestyle changes including diet and exercise. I explained the complications associated with diabetes including retinopathy, nephropathy, neuropathy as well as increased risk of cardiovascular disease. We went over the benefit seen with glycemic control.    I explained to the patient that diabetic patients are at higher than normal risk for amputations.   I have praised him on the weight loss and drastic lifestyle changes., his home BG readings have been less then 120  Mg/dL   He stopped the insulin and Rybelsus due to  worsening blurry vision  He will remain off glycemic agents for the time being and continue with lifestyle changes   MEDICATIONS:  Off Rybelsus   Off Tresiba   EDUCATION / INSTRUCTIONS:  BG monitoring instructions: Patient is instructed to check his blood sugars 3 times a day, before meals .  Call [facility] if: BG persistently < 70 . Marland Kitchen I reviewed the Rule of 15 for the treatment of hypoglycemia in detail with the patient. Literature supplied.   2) Diabetic complications:   Eye: Unknown to have known diabetic retinopathy. Pt urged to have an eye exam   Neuro/ Feet: Does not have known diabetic peripheral neuropathy.  Renal: Patient does have known baseline CKD. He is  on an ACEI/ARB at present.   3) Dyslipidemia :  - LDL above goal. He is on a statin. Discussed cardiovascular benefits of statin therapy and encouraged compliance.  - NO changes today   F/U in 3 months    Signed electronically by: Mack Guise, MD  [facility]  [facility] [address] [place], [place] [address] Phone: [phone] FAX: [phone]   CC: [name], DO [address] [place] [address] Phone: [phone]  Fax: [phone]    Return to Endocrinology clinic as below: Future Appointments  Date Time Provider [place]  05/14/2021  8:40 AM Berniece Salines, DO [facility] None  05/24/2021 10:15 AM [facility] 1 [facility]  06/20/2021  9:20 AM Cirigliano, Vito V, DO [facility]  10/04/2021  8:40 AM [name], DO [facility]

## 2021-05-03 ENCOUNTER — Other Ambulatory Visit: Payer: Self-pay | Admitting: Family Medicine

## 2021-05-09 DIAGNOSIS — M2550 Pain in unspecified joint: Secondary | ICD-10-CM | POA: Insufficient documentation

## 2021-05-09 DIAGNOSIS — M109 Gout, unspecified: Secondary | ICD-10-CM | POA: Insufficient documentation

## 2021-05-09 DIAGNOSIS — I509 Heart failure, unspecified: Secondary | ICD-10-CM | POA: Insufficient documentation

## 2021-05-09 DIAGNOSIS — T7840XA Allergy, unspecified, initial encounter: Secondary | ICD-10-CM | POA: Insufficient documentation

## 2021-05-09 DIAGNOSIS — E785 Hyperlipidemia, unspecified: Secondary | ICD-10-CM | POA: Insufficient documentation

## 2021-05-12 ENCOUNTER — Other Ambulatory Visit: Payer: Self-pay | Admitting: Family Medicine

## 2021-05-14 ENCOUNTER — Ambulatory Visit (INDEPENDENT_AMBULATORY_CARE_PROVIDER_SITE_OTHER): Payer: BC Managed Care – PPO

## 2021-05-14 ENCOUNTER — Ambulatory Visit: Payer: BC Managed Care – PPO | Admitting: Cardiology

## 2021-05-14 ENCOUNTER — Other Ambulatory Visit: Payer: Self-pay

## 2021-05-14 ENCOUNTER — Encounter: Payer: Self-pay | Admitting: Cardiology

## 2021-05-14 VITALS — BP 142/94 | HR 89 | Ht 69.0 in | Wt 233.0 lb

## 2021-05-14 DIAGNOSIS — I428 Other cardiomyopathies: Secondary | ICD-10-CM

## 2021-05-14 DIAGNOSIS — I5020 Unspecified systolic (congestive) heart failure: Secondary | ICD-10-CM

## 2021-05-14 DIAGNOSIS — E1165 Type 2 diabetes mellitus with hyperglycemia: Secondary | ICD-10-CM

## 2021-05-14 DIAGNOSIS — I493 Ventricular premature depolarization: Secondary | ICD-10-CM

## 2021-05-14 DIAGNOSIS — I10 Essential (primary) hypertension: Secondary | ICD-10-CM | POA: Diagnosis not present

## 2021-05-14 DIAGNOSIS — E669 Obesity, unspecified: Secondary | ICD-10-CM

## 2021-05-14 DIAGNOSIS — R0989 Other specified symptoms and signs involving the circulatory and respiratory systems: Secondary | ICD-10-CM | POA: Diagnosis not present

## 2021-05-14 DIAGNOSIS — E785 Hyperlipidemia, unspecified: Secondary | ICD-10-CM

## 2021-05-14 MED ORDER — ENTRESTO 24-26 MG PO TABS: 1.0000 | ORAL_TABLET | Freq: Two times a day (BID) | ORAL | 3 refills | Status: DC

## 2021-05-14 MED ORDER — AMLODIPINE BESYLATE 5 MG PO TABS: 5.0000 mg | ORAL_TABLET | Freq: Every day | ORAL | 3 refills | Status: DC

## 2021-05-14 MED ORDER — AMLODIPINE BESYLATE 10 MG PO TABS: 10.0000 mg | ORAL_TABLET | Freq: Every day | ORAL | 3 refills | Status: DC

## 2021-05-14 NOTE — Patient Instructions (Signed)
Medication Instructions:  Your physician has recommended you make the following change in your medication: STOP: Amlodipine-benazepril (LOTREL) START: Amlodipine 10 mg once daily START ON FRIDAY: Entresto 24-26 mg twice daily *If you need a refill on your cardiac medications before your next appointment, please call your pharmacy*   Lab Work: None If you have labs (blood work) drawn today and your tests are completely normal, you will receive your results only by: [place] (if you have MyChart) OR A paper copy in the mail If you have any lab test that is abnormal or we need to change your treatment, we will call you to review the results.   Testing/Procedures: A zio monitor was ordered today. It will remain on for 7 days. You will then return monitor and event diary in provided box. It takes 1-2 weeks for report to be downloaded and returned to [place]. We will call you with the results. If monitor falls off or has orange flashing light, please call Zio for further instructions.     Follow-Up: At [facility], you and your health needs are our priority.  As part of our continuing mission to provide you with exceptional heart care, we have created designated Provider Care Teams.  These Care Teams include your primary Cardiologist (physician) and Advanced Practice Providers (APPs -  Physician Assistants and Nurse Practitioners) who all work together to provide you with the care you need, when you need it.  We recommend signing up for the patient portal called "MyChart".  Sign up information is provided on this After Visit Summary.  MyChart is used to connect with patients for Virtual Visits (Telemedicine).  Patients are able to view lab/test results, encounter notes, upcoming appointments, etc.  Non-urgent messages can be sent to your provider as well.   To learn more about what you can do with MyChart, go to [email or web address].    Your next appointment:   6 week(s)  The format  for your next appointment:   In Person  Provider:   Berniece Salines, DO   Other Instructions ZIO  WHY IS MY DOCTOR PRESCRIBING ZIO? The Zio system is proven and trusted by physicians to detect and diagnose irregular heart rhythms -- and has been prescribed to hundreds of thousands of patients.  The FDA has cleared the Zio system to monitor for many different kinds of irregular heart rhythms. In a study, physicians were able to reach a diagnosis 90% of the time with the Zio system1.  You can wear the Zio monitor -- a small, discreet, comfortable patch -- during your normal day-to-day activity, including while you sleep, shower, and exercise, while it records every single heartbeat for analysis.  1Barrett, P., et al. Comparison of 24 Hour Holter Monitoring Versus 14 Day Novel Adhesive Patch Electrocardiographic Monitoring. [place], 2014.  ZIO VS. HOLTER MONITORING The Zio monitor can be comfortably worn for up to 14 days. Holter monitors can be worn for 24 to 48 hours, limiting the time to record any irregular heart rhythms you may have. Zio is able to capture data for the 51% of patients who have their first symptom-triggered arrhythmia after 48 hours.1  LIVE WITHOUT RESTRICTIONS The Zio ambulatory cardiac monitor is a small, unobtrusive, and water-resistant patch--you might even forget you're wearing it. The Zio monitor records and stores every beat of your heart, whether you're sleeping, working out, or showering. Remove on: June 20th 2022

## 2021-05-14 NOTE — Progress Notes (Signed)
Cardiology Office Note:    Date:  05/14/2021   ID:  Alfred Martin, DOB 01-25-1966, MRN [id number]  PCP:  [name], DO  Cardiologist:  Berniece Salines, DO  Electrophysiologist:  None   Referring MD: Carollee Herter, [name], *   No chief complaint on file. " I wanted to re-establish care with a heart doctor  History of Present Illness:    Alfred Martin is a 55 y.o. male with a hx of heart failure with reduced ejection fraction EF, nonischemic cardiomyopathy ejection fraction 20 to 25% in 2016, hypertension, type 2 diabetes presents to reestablish cardiac care.  The patient tells me that he was first diagnosed with nonischemic cardiomyopathy when he was 23 at that time he had a left heart catheterization which was normal.  He follows with cardiology at [facility] and was last seen there in 2017.  At that time was noted that he had had a negative stress echo and his EF during that test was 40%.  Report any symptoms of shortness of breath, chest pain or palpitations.  He notes that he is just concerned because recently a family member died from cardiac arrest and he decided that he needed to be following cardiology closely.  Past Medical History:  Diagnosis Date   Allergy    CHF (congestive heart failure) (HCC)    Gout    HTN (hypertension)    Hyperlipidemia    diet controlled and exercise   Joint pain     Past Surgical History:  Procedure Laterality Date   TONSILLECTOMY      Current Medications: Current Meds  Medication Sig   ACCU-CHEK GUIDE test strip USE AS DIRECTED UP TO 4 TIMES A DAY TO CHECK BLOOD SUGAR   allopurinol (ZYLOPRIM) 300 MG tablet TAKE 1 TABLET BY MOUTH EVERY DAY   amLODipine (NORVASC) 5 MG tablet Take 1 tablet (5 mg total) by mouth daily.   blood glucose meter kit and supplies KIT Dispense based on patient and insurance preference. Use up to four times daily as directed.   fluticasone (FLONASE) 50 MCG/ACT nasal spray Place 2 sprays into both nostrils  daily.   fluticasone (FLONASE) 50 MCG/ACT nasal spray Place 2 sprays into both nostrils daily.   hydrochlorothiazide (HYDRODIURIL) 25 MG tablet TAKE 1 TABLET BY MOUTH EVERY DAY   hydrocortisone-pramoxine (PROCTOFOAM-HC) rectal foam Place 1 applicator rectally 2 (two) times daily.   hydroquinone 4 % cream APPLY TO AFFECTED AREA TWICE A DAY   Lancets MISC Use to check glucose three times daily   levocetirizine (XYZAL) 5 MG tablet TAKE 1 TABLET BY MOUTH EVERY DAY IN THE EVENING   metoprolol succinate (TOPROL-XL) 25 MG 24 hr tablet TAKE 1 TABLET BY MOUTH EVERY DAY   rosuvastatin (CRESTOR) 40 MG tablet Take 1 tablet (40 mg total) by mouth daily.   sacubitril-valsartan (ENTRESTO) 24-26 MG Take 1 tablet by mouth 2 (two) times daily.   sildenafil (VIAGRA) 100 MG tablet Take 0.5-1 tablets (50-100 mg total) by mouth daily as needed for erectile dysfunction.   Vitamin D, Ergocalciferol, (DRISDOL) 1.25 MG (50000 UNIT) CAPS capsule TAKE 1 CAPSULE (50,000 UNITS TOTAL) BY MOUTH EVERY 7 (SEVEN) DAYS.   [DISCONTINUED] amLODipine-benazepril (LOTREL) 10-40 MG capsule Take 1 capsule by mouth daily.     Allergies:   Patient has no known allergies.   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, prison and probation services  Tobacco Use   Smoking status: Never   Smokeless tobacco: Never  Vaping Use   Vaping Use: Never used  Substance and Sexual Activity   Alcohol use: No    Alcohol/week: 0.0 standard drinks   Drug use: No   Sexual activity: Yes  Other Topics Concern   Not on file  Social History Narrative   Exercise-- jogging 3-4 days a week   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   Social Determinants of Radio broadcast assistant Strain: Not on file  Food  Insecurity: Not on file  Transportation Needs: Not on file  Physical Activity: Not on file  Stress: Not on file  Social Connections: Not on file     Family History: The patient's family history includes Arthritis in his maternal aunt, maternal aunt, maternal aunt, and mother; Breast cancer in his maternal aunt and paternal grandmother; Colon cancer in his paternal uncle; Congestive Heart Failure in his brother and paternal uncle; Diabetes in his brother, father, and mother; Drug abuse in his brother; HIV/AIDS in his brother; Heart disease in his maternal grandmother; High blood pressure in his father and mother; Hyperlipidemia in his father and mother; Hypertension in his brother, brother, father, and mother; Liver disease in his father; Obesity in his mother; Stroke in his paternal uncle. There is no history of Colon polyps, Rectal cancer, or Stomach cancer.  ROS:   Review of Systems  Constitution: Negative for decreased appetite, fever and weight gain.  HENT: Negative for congestion, ear discharge, hoarse voice and sore throat.   Eyes: Negative for discharge, redness, vision loss in right eye and visual halos.  Cardiovascular: Negative for chest pain, dyspnea on exertion, leg swelling, orthopnea and palpitations.  Respiratory: Negative for cough, hemoptysis, shortness of breath and snoring.   Endocrine: Negative for heat intolerance and polyphagia.  Hematologic/Lymphatic: Negative for bleeding problem. Does not bruise/bleed easily.  Skin: Negative for flushing, nail changes, rash and suspicious lesions.  Musculoskeletal: Negative for arthritis, joint pain, muscle cramps, myalgias, neck pain and stiffness.  Gastrointestinal: Negative for abdominal pain, bowel incontinence, diarrhea and excessive appetite.  Genitourinary: Negative for decreased libido, genital sores and incomplete emptying.  Neurological: Negative for brief paralysis, focal weakness, headaches and loss of balance.   Psychiatric/Behavioral: Negative for altered mental status, depression and suicidal ideas.  Allergic/Immunologic: Negative for HIV exposure and persistent infections.    EKGs/Labs/Other Studies Reviewed:    The following studies were reviewed today:   EKG:  The ekg ordered today demonstrates sinus rhythm, heart rate 89 bpm with occasional PVCs, left atrial enlargement with poor progression in anterior wall, dressing anterior wall infarction of age-indeterminate.  Transthoracic echocardiogram 2016 Study Conclusions   - Left ventricle: The cavity size was moderately dilated. Wall    thickness was normal. LV mass 295g, LV mass index 130 g/m2, RWT    is 0.35, suggestive of eccentric hypertrophy. Systolic function    was severely reduced. The estimated ejection fraction was in the    range of 20% to 25%. Severe global hypokinesis. Doppler    parameters are consistent with diastolic dysfunction - filling    pressures are indeterminate.  - Mitral valve: Mildly thickened leaflets . There was mild    regurgitation.  - Left atrium:  Severely dilated at 52 ml/m2.  - Right atrium: The atrium was mildly dilated.  - Inferior vena cava: The vessel was dilated. The respirophasic    diameter changes were blunted (< 50%), consistent with elevated    central venous pressure.   Impressions:   - LVEF 20-25%, moderately dilated LV with eccentric hypertrophy,    severe global hypokinesis, diastolic dysfunction, mild MR,    severely dilated LA, dilated IVC. Increased LV mass with normal    wall thickness and dilated ventricle, suggestive of probable    hypertensive cardiomyopathy.   Recent Labs: 04/03/2021: Hemoglobin 14.8; Platelets 170.0; TSH 1.47 04/06/2021: ALT 77; BUN 25; Creatinine, Ser 1.13; Potassium 4.3; Sodium 134  Recent Lipid Panel    Component Value Date/Time   CHOL 196 04/03/2021 0910   TRIG 159.0 (H) 04/03/2021 0910   HDL 48.60 04/03/2021 0910   CHOLHDL 4 04/03/2021 0910   VLDL  31.8 04/03/2021 0910   LDLCALC 116 (H) 04/03/2021 0910   LDLCALC 128 (H) 05/22/2018 1636   LDLDIRECT 127.0 03/18/2019 1503    Physical Exam:    VS:  BP (!) 142/94 (BP Location: Right Arm)   Pulse 89   Ht '5\' 9"'  (1.753 m)   Wt 233 lb (105.7 kg)   SpO2 98%   BMI 34.41 kg/m     Wt Readings from Last 3 Encounters:  05/14/21 233 lb (105.7 kg)  05/01/21 234 lb 6.4 oz (106.3 kg)  04/03/21 232 lb 3.2 oz (105.3 kg)     GEN: Well nourished, well developed in no acute distress HEENT: Normal NECK: No JVD; No carotid bruits LYMPHATICS: No lymphadenopathy CARDIAC: S1S2 noted,RRR, no murmurs, rubs, gallops RESPIRATORY:  Clear to auscultation without rales, wheezing or rhonchi  ABDOMEN: Soft, non-tender, non-distended, +bowel sounds, no guarding. EXTREMITIES: No edema, No cyanosis, no clubbing MUSCULOSKELETAL:  No deformity  SKIN: Warm and dry NEUROLOGIC:  Alert and oriented x 3, non-focal PSYCHIATRIC:  Normal affect, good insight  ASSESSMENT:    1. PVC (premature ventricular contraction)   2. Depressed left ventricular ejection fraction   3. Hypertension, unspecified type   4. Nonischemic cardiomyopathy (HCC)   5. HFrEF (heart failure with reduced ejection fraction) (HCC)   6. Obesity (BMI 30-39.9)   7. Uncontrolled type 2 diabetes mellitus with hyperglycemia ([place])   8. Hyperlipidemia LDL goal <100    PLAN:    There is evidence of PVC on his EKG I like to place monitor the patient to understand his PVC burden. In addition, we will get an echocardiogram to reassess his LV function as well as for any other structural abnormality.  In light of his cardiomyopathy I going to stop the benazepril.  I have educated the patient that he needs to wait 3 days before starting Entresto.  Understanding was confirmed based by teach back method.  For now we will will wait for his repeat echocardiogram before introducing other guideline directed medications.  He will continue on his amlodipine 10  mg daily, hydrochlorothiazide 25 mg daily, metoprolol succinate.  Hyperlipidemia - continue with current statin medication.  This is being managed by his primary care doctor and his endocrinologist.  No adjustments for antidiabetic medications were made today.  Although if his EF is still low we will consider SGL 2 inhibitors which I will discuss with his endocrinologist prior to starting this medication.  The patient understands the need to lose weight with diet and exercise. We have discussed specific strategies for this.  The patient is  in agreement with the above plan. The patient left the office in stable condition.  The patient will follow up in 6 weeks   Medication Adjustments/Labs and Tests Ordered: Current medicines are reviewed at length with the patient today.  Concerns regarding medicines are outlined above.  Orders Placed This Encounter  Procedures   LONG TERM MONITOR (3-14 DAYS)   EKG 12-Lead   Meds ordered this encounter  Medications   sacubitril-valsartan (ENTRESTO) 24-26 MG    Sig: Take 1 tablet by mouth 2 (two) times daily.    Dispense:  180 tablet    Refill:  3   amLODipine (NORVASC) 5 MG tablet    Sig: Take 1 tablet (5 mg total) by mouth daily.    Dispense:  90 tablet    Refill:  3    Patient Instructions  Medication Instructions:  Your physician has recommended you make the following change in your medication: STOP: Amlodipine-benazepril (LOTREL) START: Amlodipine 10 mg once daily START ON FRIDAY: Entresto 24-26 mg twice daily *If you need a refill on your cardiac medications before your next appointment, please call your pharmacy*   Lab Work: None If you have labs (blood work) drawn today and your tests are completely normal, you will receive your results only by: [place] (if you have MyChart) OR A paper copy in the mail If you have any lab test that is abnormal or we need to change your treatment, we will call you to review the  results.   Testing/Procedures: A zio monitor was ordered today. It will remain on for 7 days. You will then return monitor and event diary in provided box. It takes 1-2 weeks for report to be downloaded and returned to [place]. We will call you with the results. If monitor falls off or has orange flashing light, please call Zio for further instructions.     Follow-Up: At [facility], you and your health needs are our priority.  As part of our continuing mission to provide you with exceptional heart care, we have created designated Provider Care Teams.  These Care Teams include your primary Cardiologist (physician) and Advanced Practice Providers (APPs -  Physician Assistants and Nurse Practitioners) who all work together to provide you with the care you need, when you need it.  We recommend signing up for the patient portal called "MyChart".  Sign up information is provided on this After Visit Summary.  MyChart is used to connect with patients for Virtual Visits (Telemedicine).  Patients are able to view lab/test results, encounter notes, upcoming appointments, etc.  Non-urgent messages can be sent to your provider as well.   To learn more about what you can do with MyChart, go to [email or web address].    Your next appointment:   6 week(s)  The format for your next appointment:   In Person  Provider:   Berniece Salines, DO   Other Instructions ZIO  WHY IS MY DOCTOR PRESCRIBING ZIO? The Zio system is proven and trusted by physicians to detect and diagnose irregular heart rhythms -- and has been prescribed to hundreds of thousands of patients.  The FDA has cleared the Zio system to monitor for many different kinds of irregular heart rhythms. In a study, physicians were able to reach a diagnosis 90% of the time with the Zio system1.  You can wear the Zio monitor -- a small, discreet, comfortable patch -- during your normal day-to-day activity, including while you sleep, shower, and exercise,  while it records every  single heartbeat for analysis.  1Barrett, P., et al. Comparison of 24 Hour Holter Monitoring Versus 14 Day Novel Adhesive Patch Electrocardiographic Monitoring. [place], 2014.  ZIO VS. HOLTER MONITORING The Zio monitor can be comfortably worn for up to 14 days. Holter monitors can be worn for 24 to 48 hours, limiting the time to record any irregular heart rhythms you may have. Zio is able to capture data for the 51% of patients who have their first symptom-triggered arrhythmia after 48 hours.1  LIVE WITHOUT RESTRICTIONS The Zio ambulatory cardiac monitor is a small, unobtrusive, and water-resistant patch--you might even forget you're wearing it. The Zio monitor records and stores every beat of your heart, whether you're sleeping, working out, or showering. Remove on: June 20th 2022   Adopting a Healthy Lifestyle.  Know what a healthy weight is for you (roughly BMI <25) and aim to maintain this   Aim for 7+ servings of fruits and vegetables daily   65-80+ fluid ounces of water or unsweet tea for healthy kidneys   Limit to max 1 drink of alcohol per day; avoid smoking/tobacco   Limit animal fats in diet for cholesterol and heart health - choose grass fed whenever available   Avoid highly processed foods, and foods high in saturated/trans fats   Aim for low stress - take time to unwind and care for your mental health   Aim for 150 min of moderate intensity exercise weekly for heart health, and weights twice weekly for bone health   Aim for 7-9 hours of sleep daily   When it comes to diets, agreement about the perfect plan isnt easy to find, even among the experts. Experts at the [place] developed an idea known as the Healthy Eating Plate. Just imagine a plate divided into logical, healthy portions.   The emphasis is on diet quality:   Load up on vegetables and fruits - one-half of your plate: Aim for color and  variety, and remember that potatoes dont count.   Go for whole grains - one-quarter of your plate: Whole wheat, barley, wheat berries, quinoa, oats, brown rice, and foods made with them. If you want pasta, go with whole wheat pasta.   Protein power - one-quarter of your plate: Fish, chicken, beans, and nuts are all healthy, versatile protein sources. Limit red meat.   The diet, however, does go beyond the plate, offering a few other suggestions.   Use healthy plant oils, such as olive, canola, soy, corn, sunflower and peanut. Check the labels, and avoid partially hydrogenated oil, which have unhealthy trans fats.   If youre thirsty, drink water. Coffee and tea are good in moderation, but skip sugary drinks and limit milk and dairy products to one or two daily servings.   The type of carbohydrate in the diet is more important than the amount. Some sources of carbohydrates, such as vegetables, fruits, whole grains, and beans-are healthier than others.   Finally, stay active  Signed, Berniece Salines, DO  05/14/2021 10:21 AM    [place]

## 2021-05-14 NOTE — Addendum Note (Signed)
Addended by: Orvan July on: 05/14/2021 10:28 AM   Modules accepted: Orders

## 2021-05-16 ENCOUNTER — Encounter (HOSPITAL_BASED_OUTPATIENT_CLINIC_OR_DEPARTMENT_OTHER): Payer: Self-pay

## 2021-05-16 ENCOUNTER — Emergency Department (HOSPITAL_BASED_OUTPATIENT_CLINIC_OR_DEPARTMENT_OTHER): Payer: BC Managed Care – PPO

## 2021-05-16 ENCOUNTER — Observation Stay (HOSPITAL_BASED_OUTPATIENT_CLINIC_OR_DEPARTMENT_OTHER)
Admission: EM | Admit: 2021-05-16 | Discharge: 2021-05-18 | Disposition: A | Discharge status: Home / Self Care | Payer: BC Managed Care – PPO | Attending: Internal Medicine | Admitting: Internal Medicine

## 2021-05-16 ENCOUNTER — Other Ambulatory Visit: Payer: Self-pay

## 2021-05-16 DIAGNOSIS — Z833 Family history of diabetes mellitus: Secondary | ICD-10-CM

## 2021-05-16 DIAGNOSIS — N182 Chronic kidney disease, stage 2 (mild): Secondary | ICD-10-CM

## 2021-05-16 DIAGNOSIS — E119 Type 2 diabetes mellitus without complications: Secondary | ICD-10-CM

## 2021-05-16 DIAGNOSIS — I509 Heart failure, unspecified: Secondary | ICD-10-CM | POA: Diagnosis not present

## 2021-05-16 DIAGNOSIS — M5410 Radiculopathy, site unspecified: Secondary | ICD-10-CM | POA: Diagnosis present

## 2021-05-16 DIAGNOSIS — M109 Gout, unspecified: Secondary | ICD-10-CM | POA: Diagnosis present

## 2021-05-16 DIAGNOSIS — M549 Dorsalgia, unspecified: Secondary | ICD-10-CM | POA: Diagnosis present

## 2021-05-16 DIAGNOSIS — I5022 Chronic systolic (congestive) heart failure: Secondary | ICD-10-CM | POA: Diagnosis present

## 2021-05-16 DIAGNOSIS — M5412 Radiculopathy, cervical region: Secondary | ICD-10-CM

## 2021-05-16 DIAGNOSIS — Z83438 Family history of other disorder of lipoprotein metabolism and other lipidemia: Secondary | ICD-10-CM

## 2021-05-16 DIAGNOSIS — E1122 Type 2 diabetes mellitus with diabetic chronic kidney disease: Secondary | ICD-10-CM | POA: Insufficient documentation

## 2021-05-16 DIAGNOSIS — Z8249 Family history of ischemic heart disease and other diseases of the circulatory system: Secondary | ICD-10-CM

## 2021-05-16 DIAGNOSIS — I428 Other cardiomyopathies: Secondary | ICD-10-CM | POA: Diagnosis present

## 2021-05-16 DIAGNOSIS — Z79899 Other long term (current) drug therapy: Secondary | ICD-10-CM | POA: Insufficient documentation

## 2021-05-16 DIAGNOSIS — I130 Hypertensive heart and chronic kidney disease with heart failure and stage 1 through stage 4 chronic kidney disease, or unspecified chronic kidney disease: Secondary | ICD-10-CM | POA: Insufficient documentation

## 2021-05-16 DIAGNOSIS — E785 Hyperlipidemia, unspecified: Secondary | ICD-10-CM | POA: Diagnosis present

## 2021-05-16 DIAGNOSIS — T39395A Adverse effect of other nonsteroidal anti-inflammatory drugs [NSAID], initial encounter: Secondary | ICD-10-CM | POA: Diagnosis present

## 2021-05-16 DIAGNOSIS — Z20822 Contact with and (suspected) exposure to covid-19: Secondary | ICD-10-CM | POA: Insufficient documentation

## 2021-05-16 DIAGNOSIS — I10 Essential (primary) hypertension: Secondary | ICD-10-CM | POA: Diagnosis present

## 2021-05-16 DIAGNOSIS — N179 Acute kidney failure, unspecified: Secondary | ICD-10-CM

## 2021-05-16 HISTORY — DX: Acute kidney failure, unspecified: N18.2

## 2021-05-16 HISTORY — DX: Chronic kidney disease, stage 2 (mild): N17.9

## 2021-05-16 LAB — CBC WITH DIFFERENTIAL/PLATELET
Abs Immature Granulocytes: 0.02 10*3/uL (ref 0.00–0.07)
Basophils Absolute: 0.1 10*3/uL (ref 0.0–0.1)
Basophils Relative: 1 %
Eosinophils Absolute: 0.1 10*3/uL (ref 0.0–0.5)
Eosinophils Relative: 2 %
HCT: 41.6 % (ref 39.0–52.0)
Hemoglobin: 13.5 g/dL (ref 13.0–17.0)
Immature Granulocytes: 0 %
Lymphocytes Relative: 50 %
Lymphs Abs: 2.9 10*3/uL (ref 0.7–4.0)
MCH: 27.7 pg (ref 26.0–34.0)
MCHC: 32.5 g/dL (ref 30.0–36.0)
MCV: 85.2 fL (ref 80.0–100.0)
Monocytes Absolute: 0.8 10*3/uL (ref 0.1–1.0)
Monocytes Relative: 14 %
Neutro Abs: 1.9 10*3/uL (ref 1.7–7.7)
Neutrophils Relative %: 33 %
Platelets: 186 10*3/uL (ref 150–400)
RBC: 4.88 MIL/uL (ref 4.22–5.81)
RDW: 13.9 % (ref 11.5–15.5)
WBC: 5.8 10*3/uL (ref 4.0–10.5)
nRBC: 0 % (ref 0.0–0.2)

## 2021-05-16 LAB — COMPREHENSIVE METABOLIC PANEL
ALT: 75 U/L — ABNORMAL HIGH (ref 0–44)
AST: 60 U/L — ABNORMAL HIGH (ref 15–41)
Albumin: 4.2 g/dL (ref 3.5–5.0)
Alkaline Phosphatase: 35 U/L — ABNORMAL LOW (ref 38–126)
Anion gap: 9 (ref 5–15)
BUN: 30 mg/dL — ABNORMAL HIGH (ref 6–20)
CO2: 29 mmol/L (ref 22–32)
Calcium: 9.8 mg/dL (ref 8.9–10.3)
Chloride: 100 mmol/L (ref 98–111)
Creatinine, Ser: 2.01 mg/dL — ABNORMAL HIGH (ref 0.61–1.24)
GFR, Estimated: 38 mL/min — ABNORMAL LOW (ref >60)
Glucose, Bld: 87 mg/dL (ref 70–99)
Potassium: 3.6 mmol/L (ref 3.5–5.1)
Sodium: 138 mmol/L (ref 135–145)
Total Bilirubin: 0.2 mg/dL — ABNORMAL LOW (ref 0.3–1.2)
Total Protein: 7.5 g/dL (ref 6.5–8.1)

## 2021-05-16 LAB — TROPONIN I (HIGH SENSITIVITY): Troponin I (High Sensitivity): 29 ng/L — ABNORMAL HIGH (ref <18)

## 2021-05-16 LAB — D-DIMER, QUANTITATIVE: D-Dimer, Quant: 0.55 ug/mL-FEU — ABNORMAL HIGH (ref 0.00–0.50)

## 2021-05-16 MED ORDER — HYDROMORPHONE HCL 1 MG/ML IJ SOLN
1.0000 mg | Freq: Once | INTRAMUSCULAR | Status: AC
Start: 2021-05-16 — End: 2021-05-16
  Administered 2021-05-16: 1 mg via INTRAVENOUS
  Filled 2021-05-16: qty 1

## 2021-05-16 MED ORDER — ONDANSETRON HCL 4 MG/2ML IJ SOLN
4.0000 mg | Freq: Once | INTRAMUSCULAR | Status: AC
  Administered 2021-05-16: 4 mg via INTRAVENOUS
  Filled 2021-05-16: qty 2

## 2021-05-16 MED ORDER — SODIUM CHLORIDE 0.9 % IV SOLN: INTRAVENOUS | Status: DC

## 2021-05-16 NOTE — ED Provider Notes (Addendum)
[facility] EMERGENCY DEPARTMENT Provider Note   CSN: [id number] Arrival date & time: 05/16/21  2018     History Chief Complaint  Patient presents with   Back Pain    Alfred Martin is a 55 y.o. male.  Patient presenting with sort of significant pain in the upper thoracic back lower part of the neck that radiates to the right extremity goes down to the level about the wrist.  No numbness or weakness in the fingers.  This is been ongoing for a few weeks.  It has been bad since yesterday.  Some relief by putting an arm on top of his head.  No anterior chest pain.  No shortness of breath with it.  Patient recently started to get followed by cardiology here and was seen on June 13.  Establishing care with them.  He has a history of heart failure and reduced ejection fraction.  Nonischemic cardiomyopathy ejection fraction was 20 to 25% in 2016 hypertension type 2 diabetes and was again presenting to reestablish cardiac care.  Patient according to discussions with cardiology was first diagnosed with nonischemic cardiopathy when he was 75 at that time had a left heart catheterization which was normal.  He follows with cardiology at [facility] in the and was last seen there in 2017.  At the time the patient was seen by cardiology on 13 there was no shortness of breath chest pain or palpitations.  Cardiology did apply an external loop recorder.  To check his rhythms.  They are planning to do echocardiogram.  He did not tell them about this lower neck upper thoracic back pain that goes to the right upper extremity.  Patient has no lower extremity swelling.      Past Medical History:  Diagnosis Date   Allergy    CHF (congestive heart failure) ([place])    Gout    HTN (hypertension)    Hyperlipidemia    diet controlled and exercise   Joint pain     Patient Active Problem List   Diagnosis Date Noted   Chronic HFrEF (heart failure with reduced ejection fraction) ([place]) 05/17/2021    Radiculopathy 05/17/2021   DM2 (diabetes mellitus, type 2) ([place]) 05/17/2021   Acute renal failure superimposed on stage 2 chronic kidney disease ([place]) 05/16/2021   Allergy    CHF (congestive heart failure) ([facility])    Gout    Hyperlipidemia    Joint pain    Urinary frequency 04/03/2021   Guaiac positive stools 04/03/2021   Hemorrhoids 04/03/2021   Uncontrolled type 2 diabetes mellitus with hyperglycemia ([place]) 03/23/2020   Olecranon bursitis, left elbow 07/29/2018   Class 2 severe obesity with serious comorbidity and body mass index (BMI) of 35.0 to 35.9 in adult ([place]) 05/23/2018   Gout of multiple sites 11/21/2017   Hyperlipidemia LDL goal <100 08/12/2017   Joint swelling 08/25/2015   Cardiomyopathy ([place]) 07/27/2015   Abnormal echocardiogram 07/27/2015   Dyspnea on exertion 06/20/2015   HTN (hypertension) 06/01/2015   Preventative health care 06/01/2015    Past Surgical History:  Procedure Laterality Date   TONSILLECTOMY         Family History  Problem Relation Age of Onset   Arthritis Mother        Severe-- RA   Diabetes Mother    Hyperlipidemia Mother    High blood pressure Mother    Hypertension Mother    Obesity Mother    Colon cancer Paternal Uncle    Congestive Heart  Failure Paternal Uncle    Stroke Paternal Uncle    Diabetes Father    Hyperlipidemia Father    High blood pressure Father    Hypertension Father    Liver disease Father    Diabetes Brother    Hypertension Brother    HIV/AIDS Brother    Breast cancer Maternal Aunt    Arthritis Maternal Aunt        RA   Hypertension Brother    Drug abuse Brother        chf-- crack / cocaine   Congestive Heart Failure Brother    Arthritis Maternal Aunt    Arthritis Maternal Aunt    Heart disease Maternal Grandmother    Breast cancer Paternal Grandmother    Colon polyps Neg Hx    Rectal cancer Neg Hx    Stomach cancer Neg Hx     Social History   Tobacco Use   Smoking status: Never   Smokeless tobacco:  Never  Vaping Use   Vaping Use: Never used  Substance Use Topics   Alcohol use: No    Alcohol/week: 0.0 standard drinks   Drug use: No    Home Medications Prior to Admission medications   Medication Sig Start Date End Date Taking? Authorizing Provider  allopurinol (ZYLOPRIM) 300 MG tablet TAKE 1 TABLET BY MOUTH EVERY DAY Patient taking differently: Take 300 mg by mouth daily. 09/11/20  Yes [name] R, DO  fluticasone (FLONASE) 50 MCG/ACT nasal spray Place 2 sprays into both nostrils daily. 12/20/20  Yes [name] R, DO  hydrochlorothiazide (HYDRODIURIL) 25 MG tablet TAKE 1 TABLET BY MOUTH EVERY DAY Patient taking differently: Take 25 mg by mouth daily. 04/12/21  Yes [name] R, DO  hydroquinone 4 % cream APPLY TO AFFECTED AREA TWICE A DAY Patient taking differently: Apply 1 application topically 2 (two) times daily as needed (rash). 11/29/20  Yes [name] R, DO  ibuprofen (ADVIL) 200 MG tablet Take 800-1,000 mg by mouth every 6 (six) hours as needed for mild pain.   Yes [provider]  levocetirizine (XYZAL) 5 MG tablet TAKE 1 TABLET BY MOUTH EVERY DAY IN THE EVENING Patient taking differently: Take 5 mg by mouth daily as needed for allergies. 03/22/21  Yes [name] R, DO  metoprolol succinate (TOPROL-XL) 25 MG 24 hr tablet TAKE 1 TABLET BY MOUTH EVERY DAY Patient taking differently: Take 25 mg by mouth daily. 05/14/21  Yes [name] R, DO  sildenafil (VIAGRA) 100 MG tablet Take 0.5-1 tablets (50-100 mg total) by mouth daily as needed for erectile dysfunction. 06/12/20  Yes [name] R, DO  ACCU-CHEK GUIDE test strip USE AS DIRECTED UP TO 4 TIMES A DAY TO CHECK BLOOD SUGAR 04/25/21   Carollee Herter, [name], DO  amLODipine (NORVASC) 10 MG tablet Take 1 tablet (10 mg total) by mouth daily. 05/14/21 08/12/21  Tobb, Kardie, DO  blood glucose meter kit and supplies KIT Dispense based on patient and insurance preference.  Use up to four times daily as directed. 04/03/21   [name], DO  hydrocortisone-pramoxine [facility]) rectal foam Place 1 applicator rectally 2 (two) times daily. Patient not taking: Reported on 05/17/2021 04/03/21   [name], DO  Lancets MISC Use to check glucose three times daily 04/09/21   Carollee Herter, [name], DO  rosuvastatin (CRESTOR) 40 MG tablet Take 1 tablet (40 mg total) by mouth daily. Patient not taking:  No sig reported 04/03/21   Carollee Herter, [name], DO  sacubitril-valsartan (ENTRESTO) 24-26 MG Take 1 tablet by mouth 2 (two) times daily. 05/14/21   Tobb, Kardie, DO  Vitamin D, Ergocalciferol, (DRISDOL) 1.25 MG (50000 UNIT) CAPS capsule TAKE 1 CAPSULE (50,000 UNITS TOTAL) BY MOUTH EVERY 7 (SEVEN) DAYS. Patient not taking: No sig reported 04/09/21   [name], DO    Allergies    Patient has no known allergies.  Review of Systems   Review of Systems  Constitutional:  Negative for chills and fever.  HENT:  Negative for congestion, ear pain and sore throat.   Eyes:  Negative for pain and visual disturbance.  Respiratory:  Negative for cough, chest tightness, shortness of breath and wheezing.   Cardiovascular:  Negative for chest pain, palpitations and leg swelling.  Gastrointestinal:  Negative for abdominal pain and vomiting.  Genitourinary:  Negative for dysuria and hematuria.  Musculoskeletal:  Positive for back pain and neck pain. Negative for arthralgias.  Skin:  Negative for color change and rash.  Neurological:  Negative for seizures and syncope.  Psychiatric/Behavioral:  Negative for confusion.   All other systems reviewed and are negative.  Physical Exam Updated Vital Signs BP 129/82 (BP Location: Left Arm)   Pulse 63   Temp 97.9 F (36.6 C) (Oral)   Resp 14   Ht 1.753 m (_0 )   Wt 107.5 kg   SpO2 100%   BMI 35.00 kg/m   Physical Exam Vitals and nursing note reviewed.  Constitutional:      General: He is not in acute  distress.    Appearance: He is well-developed.  HENT:     Head: Normocephalic and atraumatic.  Eyes:     Extraocular Movements: Extraocular movements intact.     Conjunctiva/sclera: Conjunctivae normal.     Pupils: Pupils are equal, round, and reactive to light.  Neck:     Comments: Good range of motion at the neck.  No posterior tenderness to palpation upper thoracic spine without any tenderness to palpation. Cardiovascular:     Rate and Rhythm: Normal rate and regular rhythm.     Heart sounds: No murmur heard. Pulmonary:     Effort: Pulmonary effort is normal. No respiratory distress.     Breath sounds: Normal breath sounds. No wheezing.  Abdominal:     Palpations: Abdomen is soft.     Tenderness: There is no abdominal tenderness.  Musculoskeletal:        General: No swelling.     Cervical back: Normal range of motion and neck supple. No rigidity or tenderness.     Comments: Right upper extremity without any pain movement at the wrist fingers elbow or shoulder.  Radial pulses 2+.  Good cap refill.  No numbness or weakness to the fingers or hand.  Patient states subjectively he feels pain down to the level of the wrist.  Skin:    General: Skin is warm and dry.     Capillary Refill: Capillary refill takes less than 2 seconds.  Neurological:     General: No focal deficit present.     Mental Status: He is alert and oriented to person, place, and time.     Cranial Nerves: No cranial nerve deficit.     Sensory: No sensory deficit.     Motor: No weakness.    ED Results / Procedures / Treatments   Labs (all labs ordered are listed, but only abnormal results are displayed) Labs  Reviewed  COMPREHENSIVE METABOLIC PANEL - Abnormal; Notable for the following components:      Result Value   BUN 30 (*)    Creatinine, Ser 2.01 (*)    AST 60 (*)    ALT 75 (*)    Alkaline Phosphatase 35 (*)    Total Bilirubin 0.2 (*)    GFR, Estimated 38 (*)    All other components within normal limits   D-DIMER, QUANTITATIVE - Abnormal; Notable for the following components:   D-Dimer, Quant 0.55 (*)    All other components within normal limits  BASIC METABOLIC PANEL - Abnormal; Notable for the following components:   Glucose, Bld 109 (*)    BUN 31 (*)    Creatinine, Ser 1.77 (*)    GFR, Estimated 45 (*)    All other components within normal limits  GLUCOSE, CAPILLARY - Abnormal; Notable for the following components:   Glucose-Capillary 112 (*)    All other components within normal limits  TROPONIN I (HIGH SENSITIVITY) - Abnormal; Notable for the following components:   Troponin I (High Sensitivity) 29 (*)    All other components within normal limits  TROPONIN I (HIGH SENSITIVITY) - Abnormal; Notable for the following components:   Troponin I (High Sensitivity) 27 (*)    All other components within normal limits  RESP PANEL BY RT-PCR (FLU A&B, COVID) ARPGX2  CBC WITH DIFFERENTIAL/PLATELET  CBC  HIV ANTIBODY (ROUTINE TESTING W REFLEX)  URINALYSIS, ROUTINE W REFLEX MICROSCOPIC    EKG EKG Interpretation  Date/Time:  Wednesday May 16 2021 20:33:51 EDT Ventricular Rate:  73 PR Interval:  206 QRS Duration: 119 QT Interval:  409 QTC Calculation: 451 R Axis:   44 Text Interpretation: Sinus rhythm Borderline prolonged PR interval Incomplete left bundle branch block No previous ECGs available Confirmed by Fredia Sorrow [phone]) on 05/16/2021 8:40:45 PM Also confirmed by Fredia Sorrow [phone]), editor Hattie Perch ([phone])  on 05/17/2021 9:12:50 AM  Radiology US RENAL  Result Date: 05/17/2021 CLINICAL DATA:  Acute kidney injury EXAM: RENAL / URINARY TRACT ULTRASOUND COMPLETE COMPARISON:  None. FINDINGS: Right Kidney: Renal measurements: 10.6 x 4.5 x 4.4 cm = volume: 111 mL. 7 mm midpole cyst. Normal echotexture. No hydronephrosis. Left Kidney: Renal measurements: 10.9 x 5.6 x 6.4 cm = volume: 211 mL. Echogenicity within normal limits. No mass or hydronephrosis visualized.  Bladder: Appears normal for degree of bladder distention. Other: None. IMPRESSION: No acute findings.  No hydronephrosis. Electronically Signed   By: Rolm Baptise M.D.   On: 05/17/2021 03:43   DG Chest Port 1 View  Result Date: 05/16/2021 CLINICAL DATA:  55 year old male with back pain. EXAM: PORTABLE CHEST 1 VIEW COMPARISON:  None. FINDINGS: The lungs are clear. There is no pleural effusion pneumothorax. Mild cardiomegaly. Left pectoral pacemaker device. No acute osseous pathology. IMPRESSION: No active disease. Electronically Signed   By: Anner Crete M.D.   On: 05/16/2021 21:52    Procedures Procedures   CRITICAL CARE Performed by: Fredia Sorrow Total critical care time: 35 minutes Critical care time was exclusive of separately billable procedures and treating other patients. Critical care was necessary to treat or prevent imminent or life-threatening deterioration. Critical care was time spent personally by me on the following activities: development of treatment plan with patient and/or surrogate as well as nursing, discussions with consultants, evaluation of patient's response to treatment, examination of patient, obtaining history from patient or surrogate, ordering and performing treatments and interventions, ordering and review of laboratory studies,  ordering and review of radiographic studies, pulse oximetry and re-evaluation of patient's condition.   Medications Ordered in ED Medications  0.9 %  sodium chloride infusion ( Intravenous New Bag/Given 05/17/21 0007)  amLODipine (NORVASC) tablet 10 mg (10 mg Oral Given 05/17/21 0957)  allopurinol (ZYLOPRIM) tablet 300 mg (300 mg Oral Given 05/17/21 0957)  metoprolol succinate (TOPROL-XL) 24 hr tablet 25 mg (25 mg Oral Given 05/17/21 0956)  rosuvastatin (CRESTOR) tablet 40 mg (40 mg Oral Given 05/17/21 0956)  fluticasone (FLONASE) 50 MCG/ACT nasal spray 2 spray (2 sprays Each Nare Given 05/17/21 0957)  enoxaparin (LOVENOX) injection 40  mg (40 mg Subcutaneous Given 05/17/21 0957)  acetaminophen (TYLENOL) tablet 650 mg (has no administration in time range)    Or  acetaminophen (TYLENOL) suppository 650 mg (has no administration in time range)  ondansetron (ZOFRAN) tablet 4 mg (has no administration in time range)    Or  ondansetron (ZOFRAN) injection 4 mg (has no administration in time range)  ondansetron (ZOFRAN) injection 4 mg (4 mg Intravenous Given 05/16/21 2202)  HYDROmorphone (DILAUDID) injection 1 mg (1 mg Intravenous Given 05/16/21 2203)    ED Course  I have reviewed the triage vital signs and the nursing notes.  Pertinent labs & imaging results that were available during my care of the patient were reviewed by me and considered in my medical decision making (see chart for details).    MDM Rules/Calculators/A&P                          Clinically symptoms seem to be consistent with likely cervical radiculopathy.  But no numbness or weakness to the right hand.  But based on the fact that he has had in the past has been much worse since yesterday.  Initial troponin 29 delta troponin would be important.  D-dimer was 0.55 which for age adjustment is actually normal.  Chest x-ray without any acute findings.  No lower extremity swelling.  Patient given some pain medication which made his arm feel much better and the lower neck upper thoracic pain improved significantly.  Patient has some abnormal liver function test.  But was most remarkable is that his BUN/creatinine was significantly changed from just the beginning of May.  Where his BUN was 30 and creatinine was 2.01.  Giving him a GFR of 38.  This is double worse compared to where he was at that point.  Patient is on blood pressure medicines blood pressures well controlled.  He is on hydrochlorothiazide.  He is not on Lasix.  Clinically do not think that he is having a pulmonary embolus.  No anterior chest pain no shortness of breath.  Not tachycardic.  Blood pressure  reasonably controlled here.  Also do not think he is having a dissection.  I think he does have a component of cervical radiculopathy with significant improvement with pain.  However does have acute kidney injury.  Patient is on hydrochlorothiazide not Lasix.  Significant changes from the beginning of May.  Discussed with Dr. Myna Hidalgo hospitalist at [facility] and he will admit.  I will order COVID testing and will start slow process of hydration to see if there is improvement with the kidneys.  In addition delta troponin is pending. Final Clinical Impression(s) / ED Diagnoses Final diagnoses:  AKI (acute kidney injury) ([place])  Cervical radicular pain    Rx / DC Orders ED Discharge Orders     None  Fredia Sorrow, MD 05/16/21 2344    Fredia Sorrow, MD 05/17/21 1024

## 2021-05-16 NOTE — ED Triage Notes (Signed)
Pt c/o upper back pain that radiates down right arm x 3 days-states he is wearing a heart monitor x 3 days for hx of abnormal EKGs-NAD-steady gait

## 2021-05-17 ENCOUNTER — Telehealth: Payer: Self-pay | Admitting: Cardiology

## 2021-05-17 ENCOUNTER — Observation Stay (HOSPITAL_COMMUNITY): Payer: BC Managed Care – PPO

## 2021-05-17 ENCOUNTER — Other Ambulatory Visit (HOSPITAL_COMMUNITY): Payer: BC Managed Care – PPO

## 2021-05-17 DIAGNOSIS — M549 Dorsalgia, unspecified: Secondary | ICD-10-CM | POA: Diagnosis present

## 2021-05-17 DIAGNOSIS — N182 Chronic kidney disease, stage 2 (mild): Secondary | ICD-10-CM

## 2021-05-17 DIAGNOSIS — M5413 Radiculopathy, cervicothoracic region: Secondary | ICD-10-CM

## 2021-05-17 DIAGNOSIS — I5022 Chronic systolic (congestive) heart failure: Secondary | ICD-10-CM | POA: Diagnosis present

## 2021-05-17 DIAGNOSIS — Z79899 Other long term (current) drug therapy: Secondary | ICD-10-CM | POA: Diagnosis not present

## 2021-05-17 DIAGNOSIS — E1122 Type 2 diabetes mellitus with diabetic chronic kidney disease: Secondary | ICD-10-CM | POA: Diagnosis not present

## 2021-05-17 DIAGNOSIS — M5412 Radiculopathy, cervical region: Secondary | ICD-10-CM | POA: Diagnosis not present

## 2021-05-17 DIAGNOSIS — N179 Acute kidney failure, unspecified: Secondary | ICD-10-CM

## 2021-05-17 DIAGNOSIS — I509 Heart failure, unspecified: Secondary | ICD-10-CM | POA: Diagnosis not present

## 2021-05-17 DIAGNOSIS — I130 Hypertensive heart and chronic kidney disease with heart failure and stage 1 through stage 4 chronic kidney disease, or unspecified chronic kidney disease: Secondary | ICD-10-CM | POA: Diagnosis not present

## 2021-05-17 DIAGNOSIS — E119 Type 2 diabetes mellitus without complications: Secondary | ICD-10-CM

## 2021-05-17 DIAGNOSIS — M5410 Radiculopathy, site unspecified: Secondary | ICD-10-CM | POA: Diagnosis present

## 2021-05-17 DIAGNOSIS — I10 Essential (primary) hypertension: Secondary | ICD-10-CM

## 2021-05-17 DIAGNOSIS — Z20822 Contact with and (suspected) exposure to covid-19: Secondary | ICD-10-CM | POA: Diagnosis not present

## 2021-05-17 HISTORY — DX: Chronic systolic (congestive) heart failure: I50.22

## 2021-05-17 HISTORY — DX: Type 2 diabetes mellitus without complications: E11.9

## 2021-05-17 HISTORY — DX: Radiculopathy, site unspecified: M54.10

## 2021-05-17 LAB — GLUCOSE, CAPILLARY
Glucose-Capillary: 112 mg/dL — ABNORMAL HIGH (ref 70–99)
Glucose-Capillary: 111 mg/dL — ABNORMAL HIGH (ref 70–99)
Glucose-Capillary: 155 mg/dL — ABNORMAL HIGH (ref 70–99)
Glucose-Capillary: 100 mg/dL — ABNORMAL HIGH (ref 70–99)
Glucose-Capillary: 97 mg/dL (ref 70–99)

## 2021-05-17 LAB — BASIC METABOLIC PANEL
Anion gap: 9 (ref 5–15)
BUN: 31 mg/dL — ABNORMAL HIGH (ref 6–20)
CO2: 29 mmol/L (ref 22–32)
Calcium: 10 mg/dL (ref 8.9–10.3)
Chloride: 104 mmol/L (ref 98–111)
Creatinine, Ser: 1.77 mg/dL — ABNORMAL HIGH (ref 0.61–1.24)
GFR, Estimated: 45 mL/min — ABNORMAL LOW (ref >60)
Glucose, Bld: 109 mg/dL — ABNORMAL HIGH (ref 70–99)
Potassium: 3.5 mmol/L (ref 3.5–5.1)
Sodium: 142 mmol/L (ref 135–145)

## 2021-05-17 LAB — CBC
HCT: 43.1 % (ref 39.0–52.0)
Hemoglobin: 13.6 g/dL (ref 13.0–17.0)
MCH: 27.7 pg (ref 26.0–34.0)
MCHC: 31.6 g/dL (ref 30.0–36.0)
MCV: 87.8 fL (ref 80.0–100.0)
Platelets: 177 10*3/uL (ref 150–400)
RBC: 4.91 MIL/uL (ref 4.22–5.81)
RDW: 14 % (ref 11.5–15.5)
WBC: 5.6 10*3/uL (ref 4.0–10.5)
nRBC: 0 % (ref 0.0–0.2)

## 2021-05-17 LAB — URINALYSIS, ROUTINE W REFLEX MICROSCOPIC
Bilirubin Urine: NEGATIVE
Glucose, UA: NEGATIVE mg/dL
Hgb urine dipstick: NEGATIVE
Ketones, ur: NEGATIVE mg/dL
Leukocytes,Ua: NEGATIVE
Nitrite: NEGATIVE
Protein, ur: NEGATIVE mg/dL
Specific Gravity, Urine: 1.02 (ref 1.005–1.030)
pH: 6 (ref 5.0–8.0)

## 2021-05-17 LAB — HIV ANTIBODY (ROUTINE TESTING W REFLEX): HIV Screen 4th Generation wRfx: NONREACTIVE

## 2021-05-17 LAB — RESP PANEL BY RT-PCR (FLU A&B, COVID) ARPGX2
Influenza A by PCR: NEGATIVE
Influenza B by PCR: NEGATIVE
SARS Coronavirus 2 by RT PCR: NEGATIVE

## 2021-05-17 LAB — SODIUM, URINE, RANDOM: Sodium, Ur: 76 mmol/L

## 2021-05-17 LAB — CREATININE, URINE, RANDOM: Creatinine, Urine: 61.45 mg/dL

## 2021-05-17 LAB — TROPONIN I (HIGH SENSITIVITY): Troponin I (High Sensitivity): 27 ng/L — ABNORMAL HIGH (ref <18)

## 2021-05-17 MED ORDER — FLUTICASONE PROPIONATE 50 MCG/ACT NA SUSP
2.0000 | Freq: Every day | NASAL | Status: DC
  Administered 2021-05-17 – 2021-05-18 (×2): 2 via NASAL
  Filled 2021-05-17: qty 16

## 2021-05-17 MED ORDER — ONDANSETRON HCL 4 MG/2ML IJ SOLN: 4.0000 mg | Freq: Four times a day (QID) | INTRAMUSCULAR | Status: DC | PRN

## 2021-05-17 MED ORDER — ACETAMINOPHEN 650 MG RE SUPP: 650.0000 mg | Freq: Four times a day (QID) | RECTAL | Status: DC | PRN

## 2021-05-17 MED ORDER — HYDROCODONE-ACETAMINOPHEN 5-325 MG PO TABS: 1.0000 | ORAL_TABLET | Freq: Four times a day (QID) | ORAL | Status: DC | PRN

## 2021-05-17 MED ORDER — ACETAMINOPHEN 325 MG PO TABS: 650.0000 mg | ORAL_TABLET | Freq: Four times a day (QID) | ORAL | Status: DC | PRN

## 2021-05-17 MED ORDER — ONDANSETRON HCL 4 MG PO TABS: 4.0000 mg | ORAL_TABLET | Freq: Four times a day (QID) | ORAL | Status: DC | PRN

## 2021-05-17 MED ORDER — ALLOPURINOL 300 MG PO TABS
300.0000 mg | ORAL_TABLET | Freq: Every day | ORAL | Status: DC
  Administered 2021-05-17 – 2021-05-18 (×2): 300 mg via ORAL
  Filled 2021-05-17 (×2): qty 1

## 2021-05-17 MED ORDER — AMLODIPINE BESYLATE 10 MG PO TABS
10.0000 mg | ORAL_TABLET | Freq: Every day | ORAL | Status: DC
  Administered 2021-05-17 – 2021-05-18 (×2): 10 mg via ORAL
  Filled 2021-05-17 (×2): qty 1

## 2021-05-17 MED ORDER — ROSUVASTATIN CALCIUM 10 MG PO TABS
40.0000 mg | ORAL_TABLET | Freq: Every day | ORAL | Status: DC
  Administered 2021-05-17: 40 mg via ORAL
  Filled 2021-05-17: qty 4

## 2021-05-17 MED ORDER — ENOXAPARIN SODIUM 40 MG/0.4ML IJ SOSY
40.0000 mg | PREFILLED_SYRINGE | INTRAMUSCULAR | Status: DC
  Administered 2021-05-17 – 2021-05-18 (×2): 40 mg via SUBCUTANEOUS
  Filled 2021-05-17 (×2): qty 0.4

## 2021-05-17 MED ORDER — METOPROLOL SUCCINATE ER 25 MG PO TB24
25.0000 mg | ORAL_TABLET | Freq: Every day | ORAL | Status: DC
  Administered 2021-05-17 – 2021-05-18 (×2): 25 mg via ORAL
  Filled 2021-05-17 (×2): qty 1

## 2021-05-17 NOTE — Telephone Encounter (Signed)
Spoke to [place] with [facility]. Patient is currently admitted and needs a stat MRI. Zio will have to be removed to do this. He wanted to make [place] aware. Will check with Dr. Harriet Masson if she would like a new one ordered for him after he is discharged, or if she would like him to just go ahead and send back in and we can look at the data we have. Looks like he has only wore for a couple days.   Future reference: Room Number 1510 Direct Nurse station [phone]

## 2021-05-17 NOTE — Telephone Encounter (Signed)
Marjory Lies called to discuss the patient  device need to come off in order to get his MRI done. Please advise. His direct number is listed

## 2021-05-17 NOTE — H&P (Addendum)
History and Physical    Alfred Martin OVZ:[id number] DOB: May 01, 1966 DOA: 05/16/2021  PCP: Alfred Held, DO  Patient coming from: Home  I have personally briefly reviewed patient's old medical records in [address]  Chief Complaint: Neck pain  HPI: Alfred Martin is a 55 y.o. male with medical history significant of HTN, DM2, HLD, HFrEF with EF 40% during most recent stress echo in 2017, improved from 20-25% previously.  Pt has NICM with h/o clean LHC at age 50 (when first diagnosed).  Over the past Month, pt has been doing fairly extreme diet and exercise after a scare involving his vision and BGL of 515!  He has since come off of all DM hypoglycemic medications with the blessing of his endocrinologist.  Has appointment upcoming with ophthalmologist, but vision has since improved essentially back to baseline thankfully.  He saw cardiologist on Monday this week who wanted to switch him from Lotrel to amlodipine + entresto.  He hasn't made any medication switches in this regard yet.  Wants to see what 2d echo (scheduled for next week) shows first, because he's not having any CHF symptoms at this point.  Since starting diet and exercise however, he has developed neck, upper back pain that radiates shooting pains down his Martin arm.  Symptoms are intermittent, severe.  He has been trying to take both tylenol, and an inadvisably large amount of ibuprofen (he admits taking "way too much" ibuprofen) for his pain with only minimal relief.  Presented to ED today for ongoing symptoms.  No L sided CP, no SOB, no palpitations, no leg swelling.   ED Course: On lab work, fasting BGL looks great at 87 (especially for not being on any hypoglycemics when it was in the 500s just 45 days ago).  Unfortunately his creat is 2.0 (up from 1.1 last month).  BUN 30 (25 last month).  Pt transferred to [facility] for admission.   Review of Systems: As per HPI, otherwise all review of systems negative.  Past  Medical History:  Diagnosis Date   Allergy    CHF (congestive heart failure) (HCC)    Gout    HTN (hypertension)    Hyperlipidemia    diet controlled and exercise   Joint pain     Past Surgical History:  Procedure Laterality Date   TONSILLECTOMY       reports that he has never smoked. He has never used smokeless tobacco. He reports that he does not drink alcohol and does not use drugs.  No Known Allergies  Family History  Problem Relation Age of Onset   Arthritis Mother        Severe-- RA   Diabetes Mother    Hyperlipidemia Mother    High blood pressure Mother    Hypertension Mother    Obesity Mother    Colon cancer Paternal Uncle    Congestive Heart Failure Paternal Uncle    Stroke Paternal Uncle    Diabetes Father    Hyperlipidemia Father    High blood pressure Father    Hypertension Father    Liver disease Father    Diabetes Brother    Hypertension Brother    HIV/AIDS Brother    Breast cancer Maternal Aunt    Arthritis Maternal Aunt        RA   Hypertension Brother    Drug abuse Brother        chf-- crack / cocaine   Congestive Heart Failure Brother  Arthritis Maternal Aunt    Arthritis Maternal Aunt    Heart disease Maternal Grandmother    Breast cancer Paternal Grandmother    Colon polyps Neg Hx    Rectal cancer Neg Hx    Stomach cancer Neg Hx      Prior to Admission medications   Medication Sig Start Date End Date Taking? Authorizing Provider  ACCU-CHEK GUIDE test strip USE AS DIRECTED UP TO 4 TIMES A DAY TO CHECK BLOOD SUGAR 04/25/21   Alfred Martin, Alfred Apa, DO  allopurinol (ZYLOPRIM) 300 MG tablet TAKE 1 TABLET BY MOUTH EVERY DAY 09/11/20   Alfred Martin, Alfred Apa, DO  amLODipine (NORVASC) 10 MG tablet Take 1 tablet (10 mg total) by mouth daily. 05/14/21 08/12/21  Alfred Martin, Kardie, DO  blood glucose meter kit and supplies KIT Dispense based on patient and insurance preference. Use up to four times daily as directed. 04/03/21   Alfred Alfred R, DO   fluticasone (FLONASE) 50 MCG/ACT nasal spray Place 2 sprays into both nostrils daily. 10/18/20   Alfred Alfred R, DO  fluticasone (FLONASE) 50 MCG/ACT nasal spray Place 2 sprays into both nostrils daily. 12/20/20   Alfred Alfred R, DO  hydrochlorothiazide (HYDRODIURIL) 25 MG tablet TAKE 1 TABLET BY MOUTH EVERY DAY 04/12/21   Alfred Martin, Alfred Apa, DO  hydrocortisone-pramoxine [facility]) rectal foam Place 1 applicator rectally 2 (two) times daily. 04/03/21   Alfred Alfred R, DO  hydroquinone 4 % cream APPLY TO AFFECTED AREA TWICE A DAY 11/29/20   Alfred Held, DO  Lancets MISC Use to check glucose three times daily 04/09/21   Alfred Martin, Alfred Apa, DO  levocetirizine (XYZAL) 5 MG tablet TAKE 1 TABLET BY MOUTH EVERY DAY IN THE EVENING 03/22/21   Alfred Alfred R, DO  metoprolol succinate (TOPROL-XL) 25 MG 24 hr tablet TAKE 1 TABLET BY MOUTH EVERY DAY 05/14/21   Alfred Martin, Alfred Apa, DO  rosuvastatin (CRESTOR) 40 MG tablet Take 1 tablet (40 mg total) by mouth daily. 04/03/21   Alfred Martin, Alfred R, DO  sacubitril-valsartan (ENTRESTO) 24-26 MG Take 1 tablet by mouth 2 (two) times daily. 05/14/21   Alfred Martin, Kardie, DO  sildenafil (VIAGRA) 100 MG tablet Take 0.5-1 tablets (50-100 mg total) by mouth daily as needed for erectile dysfunction. 06/12/20   Alfred Held, DO  Vitamin D, Ergocalciferol, (DRISDOL) 1.25 MG (50000 UNIT) CAPS capsule TAKE 1 CAPSULE (50,000 UNITS TOTAL) BY MOUTH EVERY 7 (SEVEN) DAYS. 04/09/21   Alfred Held, DO    Physical Exam: Vitals:   05/16/21 2330 05/17/21 0100 05/17/21 0130 05/17/21 0221  BP: 125/85 123/78 121/66 (!) 124/99  Pulse: 61 (!) 58 71 65  Resp: '15 17 17 16  ' Temp:  98.2 F (36.8 C) 98.2 F (36.8 C) 97.8 F (36.6 C)  TempSrc:  Oral Oral Oral  SpO2: 98% 93% 92% 98%  Weight:      Height:        Constitutional: NAD, calm, comfortable Eyes: PERRL, lids and conjunctivae normal ENMT: Mucous membranes are moist. Posterior  pharynx clear of any exudate or lesions.Normal dentition.  Neck: normal, supple, no masses, no thyromegaly Respiratory: clear to auscultation bilaterally, no wheezing, no crackles. Normal respiratory effort. No accessory muscle use.  Cardiovascular: Regular rate and rhythm, no murmurs / rubs / gallops. No extremity edema. 2+ pedal pulses. No carotid bruits.  Abdomen: no tenderness, no masses palpated. No hepatosplenomegaly. Bowel sounds positive.  Musculoskeletal:  no clubbing / cyanosis. No joint deformity upper and lower extremities. Good ROM, no contractures. Normal muscle tone.  Skin: no rashes, lesions, ulcers. No induration Neurologic: CN 2-12 grossly intact. Sensation intact, DTR normal. Strength 5/5 in all 4.  Psychiatric: Normal judgment and insight. Alert and oriented x 3. Normal mood.    Labs on Admission: I have personally reviewed following labs and imaging studies  CBC: Recent Labs  Lab 05/16/21 2151  WBC 5.8  NEUTROABS 1.9  HGB 13.5  HCT 41.6  MCV 85.2  PLT 073   Basic Metabolic Panel: Recent Labs  Lab 05/16/21 2151  NA 138  K 3.6  CL 100  CO2 29  GLUCOSE 87  BUN 30*  CREATININE 2.01*  CALCIUM 9.8   GFR: Estimated Creatinine Clearance: 49.6 mL/min (A) (by C-G formula based on SCr of 2.01 mg/dL (H)). Liver Function Tests: Recent Labs  Lab 05/16/21 2151  AST 60*  ALT 75*  ALKPHOS 35*  BILITOT 0.2*  PROT 7.5  ALBUMIN 4.2   No results for input(s): LIPASE, AMYLASE in the last 168 hours. No results for input(s): AMMONIA in the last 168 hours. Coagulation Profile: No results for input(s): INR, PROTIME in the last 168 hours. Cardiac Enzymes: No results for input(s): CKTOTAL, CKMB, CKMBINDEX, TROPONINI in the last 168 hours. BNP (last 3 results) No results for input(s): PROBNP in the last 8760 hours. HbA1C: No results for input(s): HGBA1C in the last 72 hours. CBG: No results for input(s): GLUCAP in the last 168 hours. Lipid Profile: No results for  input(s): CHOL, HDL, LDLCALC, TRIG, CHOLHDL, LDLDIRECT in the last 72 hours. Thyroid Function Tests: No results for input(s): TSH, T4TOTAL, FREET4, T3FREE, THYROIDAB in the last 72 hours. Anemia Panel: No results for input(s): VITAMINB12, FOLATE, FERRITIN, TIBC, IRON, RETICCTPCT in the last 72 hours. Urine analysis:    Component Value Date/Time   BILIRUBINUR Negative 04/03/2021 1214   PROTEINUR Negative 04/03/2021 1214   UROBILINOGEN 0.2 04/03/2021 1214   NITRITE Negative 04/03/2021 1214   LEUKOCYTESUR Negative 04/03/2021 1214    Radiological Exams on Admission: DG Chest Port 1 View  Result Date: 05/16/2021 CLINICAL DATA:  55 year old male with back pain. EXAM: PORTABLE CHEST 1 VIEW COMPARISON:  None. FINDINGS: The lungs are clear. There is no pleural effusion pneumothorax. Mild cardiomegaly. Left pectoral pacemaker device. No acute osseous pathology. IMPRESSION: No active disease. Electronically Signed   By: Anner Crete M.D.   On: 05/16/2021 21:52    EKG: Independently reviewed.  Assessment/Plan Principal Problem:   Acute renal failure superimposed on stage 2 chronic kidney disease (HCC) Active Problems:   HTN (hypertension)   Chronic HFrEF (heart failure with reduced ejection fraction) (HCC)   Radiculopathy    AKI on CKD 2 - Strongly suspect overuse of NSAIDs for radiculopathy is to blame. Check UA Discussed need to avoid all NSAIDs with patient Gentle hydration with LR Strict intake and output Renal US Repeat BMP in AM Hold ARB, hold HCTZ Pt had not yet started entresto (so entresto not to blame here). Radiculopathy - Suspect neck and back pain is radiculopathy Checking MRI of cervical spine Ultimately after renal recovery, probably needs to be on neurontin / lyrica eventually And if symptoms persist, may need warrant NS referral (depending on MRI results). HTN - Hold Lotrel and HCTZ Start amlodipine (without ARB). Holding off on starting entresto in setting  of AKI Continue metoprolol HFrEF - Chronic, stable, asymptomatic 2d echo ordered for next week Pt following with  cards. DM2 - Apparently diet and exercise controlled at this point, FBGL only 87 today in ED, a marked improvement from the 515 it was x1 month ago. Off of all hypoglycemics at this time per endocrinologist. Will just check BGLs AC/HS while here.  DVT prophylaxis: Lovenox Code Status: Full Family Communication: No family in room Disposition Plan: Home after renal recovery Consults called: None Admission status: Place in 18    Alfred Martin, [place] [facility]  How to contact the [facility] Attending or Consulting provider [place] or covering provider during after hours [place], for this patient?  Check the care team in [facility] and look for a) attending/consulting [facility] provider listed and b) the [facility] team listed Log into [email or web address]  Amion Physician Scheduling and messaging for groups and whole hospitals  On call and physician scheduling software for group practices, residents, hospitalists and other medical providers for call, clinic, rotation and shift schedules. On[employer] is a hospital-wide system for scheduling doctors and paging doctors on call. EasyPlot is for scientific plotting and data analysis.  [email or web address]  and use [place]'s universal password to access. If you do not have the password, please contact the hospital operator.  Locate the [facility] provider you are looking for under [facility] and page to a number that you can be directly reached. If you still have difficulty reaching the provider, please page the [facility] (Director on Call) for the Hospitalists listed on amion for assistance.  05/17/2021, 3:23 AM

## 2021-05-17 NOTE — Telephone Encounter (Signed)
I did speak with the patient nurse and advised that is okay to take of the Zio patch.  I also asked that he give the patient that monitor Tramel after he is discharged from the hospital.

## 2021-05-17 NOTE — Progress Notes (Signed)
Brief same-day note:  Patient is a 55 year old male with history of hypertension, diabetes type 2, hyperlipidemia, heart failure with reduced ejection fraction with a EF of 40%, following with cardiology, history of nonischemic cardiomyopathy who presented to the emergency department with complaints of severe neck pain.  As per the report, he recently started doing a lot of running/jogging to lose weight and to control his diabetes.  Neck pain is started at the same time.  He was mainly complaining of neck pain on the right side which radiates to his arm.  For the neck pain, he was taking 200 mg of ibuprofen very frequently.  On presentation, he was hemodynamically stable.  His creatinine was found to be in the range of 2, baseline creatinine is normal. Patient was for the management of AKI, neck pain likely from radiculopathy.  He has been started on IV fluids.  His AKI is most likely from NSAIDs induced nephropathy.  Renal ultrasound did not show any hydronephrosis.  We are checking urine creatinine sodium.  We will continue IV fluids.  Kidney function is slowly improving.  Home ARB, HCTZ on hold.  We are doing cervical spine MRI today to rule out radiculopathy. Patient seen and examined the bedside this morning.  Hemodynamically stable.  His neck pain is better than yesterday.  His wife at the bedside.  We had a long discussion about current medical management and plan.

## 2021-05-18 DIAGNOSIS — N182 Chronic kidney disease, stage 2 (mild): Secondary | ICD-10-CM | POA: Diagnosis not present

## 2021-05-18 DIAGNOSIS — N179 Acute kidney failure, unspecified: Secondary | ICD-10-CM | POA: Diagnosis not present

## 2021-05-18 LAB — LIPID PANEL
Cholesterol: 146 mg/dL (ref 0–200)
HDL: 44 mg/dL (ref >40)
LDL Cholesterol: 84 mg/dL (ref 0–99)
Total CHOL/HDL Ratio: 3.3 RATIO
Triglycerides: 88 mg/dL (ref <150)
VLDL: 18 mg/dL (ref 0–40)

## 2021-05-18 LAB — BASIC METABOLIC PANEL
Anion gap: 5 (ref 5–15)
BUN: 17 mg/dL (ref 6–20)
CO2: 28 mmol/L (ref 22–32)
Calcium: 9 mg/dL (ref 8.9–10.3)
Chloride: 105 mmol/L (ref 98–111)
Creatinine, Ser: 1.46 mg/dL — ABNORMAL HIGH (ref 0.61–1.24)
GFR, Estimated: 56 mL/min — ABNORMAL LOW (ref >60)
Glucose, Bld: 97 mg/dL (ref 70–99)
Potassium: 3.4 mmol/L — ABNORMAL LOW (ref 3.5–5.1)
Sodium: 138 mmol/L (ref 135–145)

## 2021-05-18 LAB — GLUCOSE, CAPILLARY
Glucose-Capillary: 97 mg/dL (ref 70–99)
Glucose-Capillary: 90 mg/dL (ref 70–99)

## 2021-05-18 MED ORDER — PREDNISONE 20 MG PO TABS
40.0000 mg | ORAL_TABLET | Freq: Every day | ORAL | Status: DC
  Administered 2021-05-18: 40 mg via ORAL
  Filled 2021-05-18: qty 2

## 2021-05-18 MED ORDER — PREDNISONE 10 MG PO TABS: 10.0000 mg | ORAL_TABLET | Freq: Every day | ORAL | 0 refills | Status: DC

## 2021-05-18 MED ORDER — METHOCARBAMOL 500 MG PO TABS: 500.0000 mg | ORAL_TABLET | Freq: Four times a day (QID) | ORAL | 0 refills | Status: DC | PRN

## 2021-05-18 MED ORDER — OXYCODONE HCL 5 MG PO TABS: 5.0000 mg | ORAL_TABLET | Freq: Three times a day (TID) | ORAL | 0 refills | Status: DC | PRN

## 2021-05-18 MED ORDER — POTASSIUM CHLORIDE CRYS ER 10 MEQ PO TBCR
40.0000 meq | EXTENDED_RELEASE_TABLET | Freq: Once | ORAL | Status: AC
  Administered 2021-05-18: 40 meq via ORAL
  Filled 2021-05-18: qty 4

## 2021-05-18 MED ORDER — METHOCARBAMOL 500 MG PO TABS: 1000.0000 mg | ORAL_TABLET | Freq: Four times a day (QID) | ORAL | Status: DC | PRN

## 2021-05-18 NOTE — Progress Notes (Signed)
Pt being discharged to home with wife. Discharge instructions and medication education provided to pt.

## 2021-05-18 NOTE — Plan of Care (Signed)
  Problem: Education: Goal: Knowledge of General Education information will improve Description: Including pain rating scale, medication(s)/side effects and non-pharmacologic comfort measures Outcome: Progressing   Problem: Clinical Measurements: Goal: Ability to maintain clinical measurements within normal limits will improve Outcome: Progressing Goal: Diagnostic test results will improve Outcome: Progressing   Problem: Activity: Goal: Risk for activity intolerance will decrease Outcome: Progressing   Problem: Pain Managment: Goal: General experience of comfort will improve Outcome: Progressing   Problem: Safety: Goal: Ability to remain free from injury will improve Outcome: Progressing

## 2021-05-18 NOTE — Discharge Summary (Signed)
Physician Discharge Summary  Alfred Martin ONG:[id number] DOB: Mar 28, 1966 DOA: 05/16/2021  PCP: Alfred Held, DO  Admit date: 05/16/2021 Discharge date: 05/18/2021  Admitted From: Home Disposition:  Home  Discharge Condition:Stable CODE STATUS:FULL Diet recommendation: Heart Healthy    Brief/Interim Summary:  Patient is a 55 year old male with history of hypertension, diabetes type 2, hyperlipidemia, heart failure with reduced ejection fraction with a EF of 40%, following with cardiology, history of nonischemic cardiomyopathy who presented to the emergency department with complaints of severe neck pain.  As per the report, he recently started doing a lot of running/jogging to Martin weight and to control his diabetes.  Neck pain is started at the same time.  He was mainly complaining of neck pain on the right side which radiates to his arm.  For the neck pain, he was taking 200 mg of ibuprofen very frequently.  On presentation, he was hemodynamically stable.  His creatinine was found to be in the range of 2, baseline creatinine is normal. Patient was for the management of AKI, neck pain likely from radiculopathy.  He has been started on IV fluids.  His AKI is most likely from NSAIDs induced nephropathy.  Renal ultrasound did not show any hydronephrosis.  Kidney function improved with IV fluids. Cervical spine MRI today showed moderate to severe foraminal stenosis at multiple levels.  I discussed the case with Dr. Kathyrn Sheriff, neurology who recommended outpatient follow-up and muscle relaxants and steroids on discharge. Patient seen and examined the bedside this morning.  Hemodynamically stable.  His neck pain is much better.  Patient is medically stable for discharge to home today.  His wife was at the bedside.  We had a long discussion about discharge planning.  Discharge Diagnoses:  Principal Problem:   Acute renal failure superimposed on stage 2 chronic kidney disease (HCC) Active Problems:    HTN (hypertension)   Chronic HFrEF (heart failure with reduced ejection fraction) (HCC)   Radiculopathy   DM2 (diabetes mellitus, type 2) ([place])    Discharge Instructions  Discharge Instructions     Diet - low sodium heart healthy   Complete by: As directed    Discharge instructions   Complete by: As directed    1)Please take prescribed medications as instructed 2)Follow up with your PCP in a week.Do  a BMP test during the follow-up to check kidney function 3)Follow up with neurosurgery in 2 weeks.  Name and number of the provider has been attached 4)Avoid strenuous exercise in hot weather,hydrate urself   Increase activity slowly   Complete by: As directed       Allergies as of 05/18/2021   No Known Allergies      Medication List     STOP taking these medications    hydrocortisone-pramoxine rectal foam Commonly known as: PROCTOFOAM-HC   ibuprofen 200 MG tablet Commonly known as: ADVIL   Vitamin D (Ergocalciferol) 1.25 MG (50000 UNIT) Caps capsule Commonly known as: DRISDOL       TAKE these medications    Accu-Chek Guide test strip Generic drug: glucose blood USE AS DIRECTED UP TO 4 TIMES A DAY TO CHECK BLOOD SUGAR   allopurinol 300 MG tablet Commonly known as: ZYLOPRIM TAKE 1 TABLET BY MOUTH EVERY DAY   amLODipine 10 MG tablet Commonly known as: NORVASC Take 1 tablet (10 mg total) by mouth daily.   blood glucose meter kit and supplies Kit Dispense based on patient and insurance preference. Use up to four times daily as directed.  Entresto 24-26 MG Generic drug: sacubitril-valsartan Take 1 tablet by mouth 2 (two) times daily.   fluticasone 50 MCG/ACT nasal spray Commonly known as: FLONASE Place 2 sprays into both nostrils daily.   hydrochlorothiazide 25 MG tablet Commonly known as: HYDRODIURIL TAKE 1 TABLET BY MOUTH EVERY DAY   hydroquinone 4 % cream APPLY TO AFFECTED AREA TWICE A DAY What changed: See the new instructions.   Lancets  Misc Use to check glucose three times daily   levocetirizine 5 MG tablet Commonly known as: XYZAL TAKE 1 TABLET BY MOUTH EVERY DAY IN THE EVENING What changed:  how much to take how to take this when to take this reasons to take this   methocarbamol 500 MG tablet Commonly known as: ROBAXIN Take 1 tablet (500 mg total) by mouth every 6 (six) hours as needed for muscle spasms.   metoprolol succinate 25 MG 24 hr tablet Commonly known as: TOPROL-XL TAKE 1 TABLET BY MOUTH EVERY DAY   oxyCODONE 5 MG immediate release tablet Commonly known as: Roxicodone Take 1 tablet (5 mg total) by mouth every 8 (eight) hours as needed.   predniSONE 10 MG tablet Commonly known as: DELTASONE Take 1 tablet (10 mg total) by mouth daily with breakfast. Take 4 pills daily for 2 days then 3 pills daily for 2 days then 2 pills daily for 2 days then 1 pill daily for 2 days then stop Start taking on: May 19, 2021   rosuvastatin 40 MG tablet Commonly known as: CRESTOR Take 1 tablet (40 mg total) by mouth daily.   sildenafil 100 MG tablet Commonly known as: VIAGRA Take 0.5-1 tablets (50-100 mg total) by mouth daily as needed for erectile dysfunction.        Follow-up Information     Consuella Lose, MD. Call in 2 week(s).   Specialty: Neurosurgery Why: If symptoms worsen Contact information: [address] [place] [address] [phone]         Alfred Martin, [name], DO. Schedule an appointment as soon as possible for a visit in 1 week(s).   Specialty: Family Medicine Contact information: [address] [place] [address] [phone]                No Known Allergies  Consultations: Neurosurgery   Procedures/Studies: MR CERVICAL SPINE WO CONTRAST  Result Date: 05/17/2021 CLINICAL DATA:  Cervical radiculopathy.  No known injury. EXAM: MRI CERVICAL SPINE WITHOUT CONTRAST TECHNIQUE: Multiplanar, multisequence MR imaging of the  cervical spine was performed. No intravenous contrast was administered. COMPARISON:  None. FINDINGS: Alignment: Straightening without substantial sagittal subluxation. Vertebrae: Vertebral body heights are largely maintained. Mild multilevel discogenic/degenerative endplate signal changes. No specific evidence of acute fracture or discitis/osteomyelitis. No suspicious bone lesion. Cord: Normal cord signal. Posterior Fossa, vertebral arteries, paraspinal tissues: Visualized vertebral artery flow voids are maintained with right dominant vertebral artery. Unremarkable visualized posterior fossa limited sagittal assessment. Disc levels: C2-C3: Small posterior disc osteophyte complex without significant canal or foraminal stenosis. C3-C4: Posterior disc osteophyte complex and right greater than left facet and uncovertebral hypertrophy. Moderate right foraminal stenosis and mild canal stenosis. No significant left foraminal stenosis. C4-C5: Posterior disc osteophyte complex and right greater than left facet and uncovertebral hypertrophy. Moderate to severe right greater left foraminal stenosis. Moderate canal stenosis. C5-C6: Posterior disc osteophyte complex with left greater than right facet and uncovertebral hypertrophy. Moderate to severe left and moderate right foraminal stenosis. Mild canal stenosis. C6-C7: Posterior  disc osteophyte complex with left greater than right facet and uncovertebral hypertrophy. Moderate to severe bilateral foraminal stenosis. Mild canal stenosis. C7-T1: No significant disc protrusion, foraminal stenosis, or canal stenosis. IMPRESSION: 1. Moderate to severe foraminal stenosis bilaterally at C4-C5 and C6-C7 and on the left at C5-C6. Moderate foraminal stenosis on the right at C3-C4 and C5-C6. 2. Moderate canal stenosis at C4-C5. Mild canal stenosis at C3-C4, C5-C6, and C6-C7. Electronically Signed   By: Margaretha Sheffield MD   On: 05/17/2021 11:46   US RENAL  Result Date:  05/17/2021 CLINICAL DATA:  Acute kidney injury EXAM: RENAL / URINARY TRACT ULTRASOUND COMPLETE COMPARISON:  None. FINDINGS: Right Kidney: Renal measurements: 10.6 x 4.5 x 4.4 cm = volume: 111 mL. 7 mm midpole cyst. Normal echotexture. No hydronephrosis. Left Kidney: Renal measurements: 10.9 x 5.6 x 6.4 cm = volume: 211 mL. Echogenicity within normal limits. No mass or hydronephrosis visualized. Bladder: Appears normal for degree of bladder distention. Other: None. IMPRESSION: No acute findings.  No hydronephrosis. Electronically Signed   By: Rolm Baptise M.D.   On: 05/17/2021 03:43   DG Chest Port 1 View  Addendum Date: 05/17/2021   ADDENDUM REPORT: 05/17/2021 15:15 ADDENDUM: Please note the cardiac device is an external cardiac monitor and not a pacemaker. Electronically Signed   By: Anner Crete M.D.   On: 05/17/2021 15:15   Result Date: 05/17/2021 CLINICAL DATA:  55 year old male with back pain. EXAM: PORTABLE CHEST 1 VIEW COMPARISON:  None. FINDINGS: The lungs are clear. There is no pleural effusion pneumothorax. Mild cardiomegaly. Left pectoral pacemaker device. No acute osseous pathology. IMPRESSION: No active disease. Electronically Signed: By: Anner Crete M.D. On: 05/16/2021 21:52       Discharge Exam: Vitals:   05/17/21 2032 05/18/21 0432  BP: (!) 132/95 121/78  Pulse: 67 63  Resp: 18 18  Temp: (!) 97.5 F (36.4 C) 97.7 F (36.5 C)  SpO2: 99% 96%   Vitals:   05/17/21 0957 05/17/21 1421 05/17/21 2032 05/18/21 0432  BP: 129/82 126/84 (!) 132/95 121/78  Pulse: 63 64 67 63  Resp: '14 16 18 18  ' Temp: 97.9 F (36.6 C) 98 F (36.7 C) (!) 97.5 F (36.4 C) 97.7 F (36.5 C)  TempSrc: Oral Oral Oral Oral  SpO2: 100% 100% 99% 96%  Weight:      Height:        General: Pt is alert, awake, not in acute distress,obese Cardiovascular: RRR, S1/S2 +, no rubs, no gallops Respiratory: CTA bilaterally, no wheezing, no rhonchi Abdominal: Soft, NT, ND, bowel sounds + Extremities:  no edema, no cyanosis    The results of significant diagnostics from this hospitalization (including imaging, microbiology, ancillary and laboratory) are listed below for reference.     Microbiology: Recent Results (from the past 240 hour(s))  Resp Panel by RT-PCR (Flu A&B, Covid) Nasopharyngeal Swab     Status: None   Collection Time: 05/17/21 12:08 AM   Specimen: Nasopharyngeal Swab; Nasopharyngeal(NP) swabs in vial transport medium  Result Value Ref Range Status   SARS Coronavirus 2 by RT PCR NEGATIVE NEGATIVE Final    Comment: (NOTE) SARS-CoV-2 target nucleic acids are NOT DETECTED.  The SARS-CoV-2 RNA is generally detectable in upper respiratory specimens during the acute phase of infection. The lowest concentration of SARS-CoV-2 viral copies this assay can detect is 138 copies/mL. A negative result does not preclude SARS-Cov-2 infection and should not be used as the sole basis for treatment or other patient management  decisions. A negative result may occur with  improper specimen collection/handling, submission of specimen other than nasopharyngeal swab, presence of viral mutation(s) within the areas targeted by this assay, and inadequate number of viral copies(<138 copies/mL). A negative result must be combined with clinical observations, patient history, and epidemiological information. The expected result is Negative.  Fact Sheet for Patients:  EntrepreneurPulse.com.au  Fact Sheet for Healthcare Providers:  IncredibleEmployment.be  This test is no t yet approved or cleared by the [place] FDA and  has been authorized for detection and/or diagnosis of SARS-CoV-2 by FDA under an Emergency Use Authorization (EUA). This EUA will remain  in effect (meaning this test can be used) for the duration of the COVID-19 declaration under Section 564(b)(1) of the Act, 21 U.S.C.section 360bbb-3(b)(1), unless the authorization is terminated   or revoked sooner.       Influenza A by PCR NEGATIVE NEGATIVE Final   Influenza B by PCR NEGATIVE NEGATIVE Final    Comment: (NOTE) The Xpert Xpress SARS-CoV-2/FLU/RSV plus assay is intended as an aid in the diagnosis of influenza from Nasopharyngeal swab specimens and should not be used as a sole basis for treatment. Nasal washings and aspirates are unacceptable for Xpert Xpress SARS-CoV-2/FLU/RSV testing.  Fact Sheet for Patients: EntrepreneurPulse.com.au  Fact Sheet for Healthcare Providers: IncredibleEmployment.be  This test is not yet approved or cleared by the [place] FDA and has been authorized for detection and/or diagnosis of SARS-CoV-2 by FDA under an Emergency Use Authorization (EUA). This EUA will remain in effect (meaning this test can be used) for the duration of the COVID-19 declaration under Section 564(b)(1) of the Act, 21 U.S.C. section 360bbb-3(b)(1), unless the authorization is terminated or revoked.  Performed at [facility], [address] [place], [place] [address]      Labs: BNP (last 3 results) No results for input(s): BNP in the last 8760 hours. Basic Metabolic Panel: Recent Labs  Lab 05/16/21 2151 05/17/21 0509 05/18/21 0525  NA 138 142 138  K 3.6 3.5 3.4*  CL 100 104 105  CO2 '29 29 28  ' GLUCOSE 87 109* 97  BUN 30* 31* 17  CREATININE 2.01* 1.77* 1.46*  CALCIUM 9.8 10.0 9.0   Liver Function Tests: Recent Labs  Lab 05/16/21 2151  AST 60*  ALT 75*  ALKPHOS 35*  BILITOT 0.2*  PROT 7.5  ALBUMIN 4.2   No results for input(s): LIPASE, AMYLASE in the last 168 hours. No results for input(s): AMMONIA in the last 168 hours. CBC: Recent Labs  Lab 05/16/21 2151 05/17/21 0509  WBC 5.8 5.6  NEUTROABS 1.9  --   HGB 13.5 13.6  HCT 41.6 43.1  MCV 85.2 87.8  PLT 186 177   Cardiac Enzymes: No results for input(s): CKTOTAL, CKMB, CKMBINDEX, TROPONINI in the last 168  hours. BNP: Invalid input(s): POCBNP CBG: Recent Labs  Lab 05/17/21 1218 05/17/21 1719 05/17/21 1917 05/17/21 2149 05/18/21 0708  GLUCAP 111* 155* 100* 97 97   D-Dimer Recent Labs    05/16/21 2151  DDIMER 0.55*   Hgb A1c No results for input(s): HGBA1C in the last 72 hours. Lipid Profile Recent Labs    05/18/21 0525  CHOL 146  HDL 44  LDLCALC 84  TRIG 88  CHOLHDL 3.3   Thyroid function studies No results for input(s): TSH, T4TOTAL, T3FREE, THYROIDAB in the last 72 hours.  Invalid input(s): FREET3 Anemia work up No results for input(s): VITAMINB12, FOLATE, FERRITIN, TIBC, IRON, RETICCTPCT in the  last 72 hours. Urinalysis    Component Value Date/Time   COLORURINE YELLOW 05/17/2021 1216   APPEARANCEUR CLEAR 05/17/2021 1216   LABSPEC 1.020 05/17/2021 1216   PHURINE 6.0 05/17/2021 1216   GLUCOSEU NEGATIVE 05/17/2021 1216   HGBUR NEGATIVE 05/17/2021 [place] 05/17/2021 1216   BILIRUBINUR Negative 04/03/2021 [place] 05/17/2021 1216   PROTEINUR NEGATIVE 05/17/2021 1216   UROBILINOGEN 0.2 04/03/2021 1214   NITRITE NEGATIVE 05/17/2021 1216   LEUKOCYTESUR NEGATIVE 05/17/2021 1216   Sepsis Labs Invalid input(s): PROCALCITONIN,  WBC,  LACTICIDVEN Microbiology Recent Results (from the past 240 hour(s))  Resp Panel by RT-PCR (Flu A&B, Covid) Nasopharyngeal Swab     Status: None   Collection Time: 05/17/21 12:08 AM   Specimen: Nasopharyngeal Swab; Nasopharyngeal(NP) swabs in vial transport medium  Result Value Ref Range Status   SARS Coronavirus 2 by RT PCR NEGATIVE NEGATIVE Final    Comment: (NOTE) SARS-CoV-2 target nucleic acids are NOT DETECTED.  The SARS-CoV-2 RNA is generally detectable in upper respiratory specimens during the acute phase of infection. The lowest concentration of SARS-CoV-2 viral copies this assay can detect is 138 copies/mL. A negative result does not preclude SARS-Cov-2 infection and should not be used  as the sole basis for treatment or other patient management decisions. A negative result may occur with  improper specimen collection/handling, submission of specimen other than nasopharyngeal swab, presence of viral mutation(s) within the areas targeted by this assay, and inadequate number of viral copies(<138 copies/mL). A negative result must be combined with clinical observations, patient history, and epidemiological information. The expected result is Negative.  Fact Sheet for Patients:  EntrepreneurPulse.com.au  Fact Sheet for Healthcare Providers:  IncredibleEmployment.be  This test is no t yet approved or cleared by the [place] FDA and  has been authorized for detection and/or diagnosis of SARS-CoV-2 by FDA under an Emergency Use Authorization (EUA). This EUA will remain  in effect (meaning this test can be used) for the duration of the COVID-19 declaration under Section 564(b)(1) of the Act, 21 U.S.C.section 360bbb-3(b)(1), unless the authorization is terminated  or revoked sooner.       Influenza A by PCR NEGATIVE NEGATIVE Final   Influenza B by PCR NEGATIVE NEGATIVE Final    Comment: (NOTE) The Xpert Xpress SARS-CoV-2/FLU/RSV plus assay is intended as an aid in the diagnosis of influenza from Nasopharyngeal swab specimens and should not be used as a sole basis for treatment. Nasal washings and aspirates are unacceptable for Xpert Xpress SARS-CoV-2/FLU/RSV testing.  Fact Sheet for Patients: EntrepreneurPulse.com.au  Fact Sheet for Healthcare Providers: IncredibleEmployment.be  This test is not yet approved or cleared by the [place] FDA and has been authorized for detection and/or diagnosis of SARS-CoV-2 by FDA under an Emergency Use Authorization (EUA). This EUA will remain in effect (meaning this test can be used) for the duration of the COVID-19 declaration under Section 564(b)(1)  of the Act, 21 U.S.C. section 360bbb-3(b)(1), unless the authorization is terminated or revoked.  Performed at [facility], [address], [place], [place] [address]     Please note: You were cared for by a hospitalist during your hospital stay. Once you are discharged, your primary care physician will handle any further medical issues. Please note that NO REFILLS for any discharge medications will be authorized once you are discharged, as it is imperative that you return to your primary care physician (or establish a relationship with a primary care  physician if you do not have one) for your post hospital discharge needs so that they can reassess your need for medications and monitor your lab values.    Time coordinating discharge: 40 minutes  SIGNED:   Shelly Coss, MD  [facility] 05/18/2021, 10:42 AM Pager [phone]  If 7PM-7AM, please contact night-coverage [email or web address] Password [email or web address]

## 2021-05-21 ENCOUNTER — Other Ambulatory Visit (HOSPITAL_COMMUNITY): Payer: BC Managed Care – PPO

## 2021-05-22 NOTE — Progress Notes (Signed)
Called patient for follow up post hospitalization. Patient states that he has not yet reached out to the neurologist for an appointment for his spinal stenosis. Re educated patient on the importance of proper lifting and exercise as well as following up with the neurologist. Patient states he is staying away from ibuprofen and is feeling much better since discharge. Encouraged patient to follow up with PCP and call them if any issues arise.

## 2021-05-24 ENCOUNTER — Other Ambulatory Visit: Payer: Self-pay

## 2021-05-24 ENCOUNTER — Ambulatory Visit (HOSPITAL_BASED_OUTPATIENT_CLINIC_OR_DEPARTMENT_OTHER)
Admission: RE | Admit: 2021-05-24 | Discharge: 2021-05-24 | Disposition: A | Discharge status: Home / Self Care | Payer: BC Managed Care – PPO | Source: Ambulatory Visit | Attending: Family Medicine | Admitting: Family Medicine

## 2021-05-24 DIAGNOSIS — E785 Hyperlipidemia, unspecified: Secondary | ICD-10-CM | POA: Insufficient documentation

## 2021-05-24 DIAGNOSIS — I10 Essential (primary) hypertension: Secondary | ICD-10-CM | POA: Insufficient documentation

## 2021-05-24 DIAGNOSIS — I340 Nonrheumatic mitral (valve) insufficiency: Secondary | ICD-10-CM | POA: Diagnosis not present

## 2021-05-24 DIAGNOSIS — R42 Dizziness and giddiness: Secondary | ICD-10-CM | POA: Diagnosis not present

## 2021-05-24 LAB — ECHOCARDIOGRAM COMPLETE
AR max vel: 1.64 cm2
AV Area VTI: 1.81 cm2
AV Area mean vel: 1.59 cm2
AV Mean grad: 3 mmHg
AV Peak grad: 5.2 mmHg
Ao pk vel: 1.14 m/s
Area-P 1/2: 6.27 cm2
Calc EF: 40.2 %
S' Lateral: 5.52 cm
Single Plane A2C EF: 38.9 %
Single Plane A4C EF: 43.8 %

## 2021-05-24 NOTE — Progress Notes (Signed)
*  PRELIMINARY RESULTS* Echocardiogram 2D Echocardiogram has been performed.  Luisa Hart RDCS 05/24/2021, 10:12 AM

## 2021-06-08 ENCOUNTER — Other Ambulatory Visit: Payer: Self-pay | Admitting: Family Medicine

## 2021-06-08 DIAGNOSIS — E1165 Type 2 diabetes mellitus with hyperglycemia: Secondary | ICD-10-CM

## 2021-06-13 ENCOUNTER — Other Ambulatory Visit: Payer: Self-pay | Admitting: Family Medicine

## 2021-06-18 ENCOUNTER — Other Ambulatory Visit: Payer: Self-pay | Admitting: Family Medicine

## 2021-06-20 ENCOUNTER — Ambulatory Visit (INDEPENDENT_AMBULATORY_CARE_PROVIDER_SITE_OTHER): Payer: BC Managed Care – PPO | Admitting: Gastroenterology

## 2021-06-20 ENCOUNTER — Telehealth: Payer: Self-pay | Admitting: General Surgery

## 2021-06-20 ENCOUNTER — Encounter: Payer: Self-pay | Admitting: Gastroenterology

## 2021-06-20 ENCOUNTER — Other Ambulatory Visit: Payer: Self-pay

## 2021-06-20 VITALS — BP 130/84 | HR 76 | Ht 69.0 in | Wt 230.2 lb

## 2021-06-20 DIAGNOSIS — Z1211 Encounter for screening for malignant neoplasm of colon: Secondary | ICD-10-CM | POA: Diagnosis not present

## 2021-06-20 DIAGNOSIS — E669 Obesity, unspecified: Secondary | ICD-10-CM

## 2021-06-20 DIAGNOSIS — R195 Other fecal abnormalities: Secondary | ICD-10-CM | POA: Diagnosis not present

## 2021-06-20 DIAGNOSIS — I5022 Chronic systolic (congestive) heart failure: Secondary | ICD-10-CM | POA: Diagnosis not present

## 2021-06-20 DIAGNOSIS — E119 Type 2 diabetes mellitus without complications: Secondary | ICD-10-CM

## 2021-06-20 DIAGNOSIS — Z1212 Encounter for screening for malignant neoplasm of rectum: Secondary | ICD-10-CM

## 2021-06-20 DIAGNOSIS — Z6834 Body mass index (BMI) 34.0-34.9, adult: Secondary | ICD-10-CM

## 2021-06-20 MED ORDER — CLENPIQ 10-3.5-12 MG-GM -GM/160ML PO SOLN: 1.0000 | ORAL | 0 refills | Status: DC

## 2021-06-20 NOTE — Telephone Encounter (Signed)
[facility] Pre-operative Risk Assessment     Request for Cardiac clearance:     Endoscopy Procedure  What type of surgery is being performed?     Colonoscopy with propofol at [facility]  When is this surgery scheduled?     09/12/2021  What type of clearance is required ?   Cardiac  Are there any medications that need to be held  or prescribed prior to surgery and for how long?    Practice name and name of physician performing surgery?      [facility]   What is your office phone and fax number?      Phone- [phone]  [phone]  Anesthesia type (None, local, MAC, general) ?       MAC

## 2021-06-20 NOTE — Patient Instructions (Signed)
If you are age 56 or older, your body mass index should be between 23-30. Your Body mass index is 34 kg/m. If this is out of the aforementioned range listed, please consider follow up with your Primary Care Provider.  If you are age 33 or younger, your body mass index should be between 19-25. Your Body mass index is 34 kg/m. If this is out of the aformentioned range listed, please consider follow up with your Primary Care Provider.   Due to recent changes in healthcare laws, you may see the results of your imaging and laboratory studies on MyChart before your provider has had a chance to review them.  We understand that in some cases there may be results that are confusing or concerning to you. Not all laboratory results come back in the same time frame and the provider may be waiting for multiple results in order to interpret others.  Please give [place] 48 hours in order for your provider to thoroughly review all the results before contacting the office for clarification of your results.   Thank you for choosing me and [facility].  Vito Cirigliano, D.O.

## 2021-06-20 NOTE — Progress Notes (Signed)
Chief Complaint: FOBT positive stool, hematochezia, hemorrhoids   Referring Provider:     Carollee Herter, [name], DO    HPI:     Alfred Martin is a 54 y.o. male with a history of HTN, diabetes, HLD, spinal stenosis, heart failure with EF 25-30 %, nonischemic cardiomyopathy, referred to the [facility] for Colon Cancer screening. Recent evaluation by PCM n/f FOBT+ stools on recent prostate exam that he attributes to hemorrhoids. Hemorrhoidal sxs have since resolved and he is o/w w/o GI sxs.   Follows in the Cardiology clinic with recent Zio patch evaluation and has f/u on 07/04/2021. Hgb A1c was 13.8% in 04/2021. Since then, he reports improved home BG checks.    Did have an EGD in his 20's for unclear reasons and does not recall findings. Otherwise, no prior colonoscopy.   Paternal Uncle with Colon Cancer. Otherwise, no known FDR with CRC, GI malignancy, liver disease, pancreatic disease, or IBD.    CBC Latest Ref Rng & Units 05/17/2021 05/16/2021 04/03/2021  WBC 4.0 - 10.5 K/uL 5.6 5.8 6.4  Hemoglobin 13.0 - 17.0 g/dL 13.6 13.5 14.8  Hematocrit 39.0 - 52.0 % 43.1 41.6 45.5  Platelets 150 - 400 K/uL 177 186 170.0     Past Medical History:  Diagnosis Date   Allergy    CHF (congestive heart failure) (HCC)    Gout    HTN (hypertension)    Hyperlipidemia    diet controlled and exercise   Joint pain      Past Surgical History:  Procedure Laterality Date   TONSILLECTOMY     Family History  Problem Relation Age of Onset   Arthritis Mother        Severe-- RA   Diabetes Mother    Hyperlipidemia Mother    High blood pressure Mother    Hypertension Mother    Obesity Mother    Colon cancer Paternal Uncle    Congestive Heart Failure Paternal Uncle    Stroke Paternal Uncle    Diabetes Father    Hyperlipidemia Father    High blood pressure Father    Hypertension Father    Liver disease Father    Diabetes Brother    Hypertension Brother    HIV/AIDS  Brother    Breast cancer Maternal Aunt    Arthritis Maternal Aunt        RA   Hypertension Brother    Drug abuse Brother        chf-- crack / cocaine   Congestive Heart Failure Brother    Arthritis Maternal Aunt    Arthritis Maternal Aunt    Heart disease Maternal Grandmother    Breast cancer Paternal Grandmother    Colon polyps Neg Hx    Rectal cancer Neg Hx    Stomach cancer Neg Hx    Social History   Tobacco Use   Smoking status: Never   Smokeless tobacco: Never  Vaping Use   Vaping Use: Never used  Substance Use Topics   Alcohol use: No    Alcohol/week: 0.0 standard drinks   Drug use: No   Current Outpatient Medications  Medication Sig Dispense Refill   ACCU-CHEK GUIDE test strip USE AS DIRECTED UP TO 4 TIMES A DAY TO CHECK BLOOD SUGAR 100 strip 1   Accu-Chek Softclix Lancets lancets USE TO CHECK GLUCOSE THREE TIMES DAILY 100 each 1   allopurinol (ZYLOPRIM) 300 MG tablet TAKE  1 TABLET BY MOUTH EVERY DAY 90 tablet 2   amLODipine (NORVASC) 10 MG tablet Take 1 tablet (10 mg total) by mouth daily. 90 tablet 3   blood glucose meter kit and supplies KIT Dispense based on patient and insurance preference. Use up to four times daily as directed. 1 each 0   fluticasone (FLONASE) 50 MCG/ACT nasal spray Place 2 sprays into both nostrils daily. 16 g 6   hydrochlorothiazide (HYDRODIURIL) 25 MG tablet TAKE 1 TABLET BY MOUTH EVERY DAY 90 tablet 1   hydroquinone 4 % cream APPLY TO AFFECTED AREA TWICE A DAY 28.35 g 1   levocetirizine (XYZAL) 5 MG tablet TAKE 1 TABLET BY MOUTH EVERY DAY IN THE EVENING 90 tablet 0   methocarbamol (ROBAXIN) 500 MG tablet Take 1 tablet (500 mg total) by mouth every 6 (six) hours as needed for muscle spasms. 30 tablet 0   metoprolol succinate (TOPROL-XL) 25 MG 24 hr tablet TAKE 1 TABLET BY MOUTH EVERY DAY 90 tablet 1   oxyCODONE (ROXICODONE) 5 MG immediate release tablet Take 1 tablet (5 mg total) by mouth every 8 (eight) hours as needed. 20 tablet 0    predniSONE (DELTASONE) 10 MG tablet Take 1 tablet (10 mg total) by mouth daily with breakfast. Take 4 pills daily for 2 days then 3 pills daily for 2 days then 2 pills daily for 2 days then 1 pill daily for 2 days then stop 20 tablet 0   rosuvastatin (CRESTOR) 40 MG tablet Take 1 tablet (40 mg total) by mouth daily. 90 tablet 0   sildenafil (VIAGRA) 100 MG tablet Take 0.5-1 tablets (50-100 mg total) by mouth daily as needed for erectile dysfunction. 10 tablet 11   No current facility-administered medications for this visit.   No Known Allergies   Review of Systems: All systems reviewed and negative except where noted in HPI.     Physical Exam:    Wt Readings from Last 3 Encounters:  06/20/21 230 lb 4 oz (104.4 kg)  05/16/21 237 lb (107.5 kg)  05/14/21 233 lb (105.7 kg)    BP 130/84   Pulse 76   Ht _0  (1.753 m)   Wt 230 lb 4 oz (104.4 kg)   SpO2 98%   BMI 34.00 kg/m  Constitutional:  Pleasant, in no acute distress. Psychiatric: Normal mood and affect. Behavior is normal. EENT: Pupils normal.  Conjunctivae are normal. No scleral icterus. Neck supple. No cervical LAD. Cardiovascular: Normal rate, regular rhythm. No edema Pulmonary/chest: Effort normal and breath sounds normal. No wheezing, rales or rhonchi. Abdominal: Soft, nondistended, nontender. Bowel sounds active throughout. There are no masses palpable. No hepatomegaly. Neurological: Alert and oriented to person place and time. Skin: Skin is warm and dry. No rashes noted.   ASSESSMENT AND PLAN;   1) Colon cancer screening 2) FOBT positive stool - Schedule colonoscopy - If clinically significant hemorrhoidal disease, briefly discussed hemorrhoid banding  3) CHF with EF 25-30% 4) Nonischemic cardiomyopathy 5) Diabetes - Colonoscopy to be scheduled at [facility] due to elevated periprocedural risks - Cardiac clearance pending appt with Dr. Harriet Masson on 8/3  The indications, risks, and benefits of colonoscopy were  explained to the patient in detail. Risks include but are not limited to bleeding, perforation, adverse reaction to medications, and cardiopulmonary compromise. Sequelae include but are not limited to the possibility of surgery, hospitalization, and mortality. The patient verbalized understanding and wished to proceed. All questions answered, referred to the scheduler and bowel  prep ordered. Further recommendations pending results of the exam.      Alfred Bullion, DO, FACG  06/20/2021, 9:50 AM   Carollee Herter, [name], *

## 2021-06-22 NOTE — Telephone Encounter (Signed)
   Patient Name: Alfred Martin  DOB: [birth year] MRN: [id number]  Primary Cardiologist: Berniece Salines, DO  Chart reviewed as part of pre-operative protocol coverage for endoscopy and colonoscopy 09/12/2021.  Mr. Phillip Heal was last seen in office 05/14/2021 by Dr. Harriet Masson.    He is scheduled for an upcoming appointment is with Dr. Harriet Masson 07/04/21, which will occur before the below procedure.    We will update this appointment to include pre-op evaluation in the clinic notes. Will remove from box.   Arvil Chaco, PA-C 06/22/2021, 1:56 PM

## 2021-06-29 ENCOUNTER — Other Ambulatory Visit: Payer: Self-pay | Admitting: Family Medicine

## 2021-06-29 DIAGNOSIS — J0140 Acute pansinusitis, unspecified: Secondary | ICD-10-CM

## 2021-07-03 ENCOUNTER — Other Ambulatory Visit: Payer: Self-pay | Admitting: Family Medicine

## 2021-07-03 DIAGNOSIS — E1169 Type 2 diabetes mellitus with other specified complication: Secondary | ICD-10-CM

## 2021-07-03 DIAGNOSIS — E785 Hyperlipidemia, unspecified: Secondary | ICD-10-CM

## 2021-07-04 ENCOUNTER — Telehealth: Payer: Self-pay | Admitting: Cardiology

## 2021-07-04 ENCOUNTER — Other Ambulatory Visit: Payer: Self-pay

## 2021-07-04 ENCOUNTER — Ambulatory Visit: Payer: BC Managed Care – PPO | Admitting: Cardiology

## 2021-07-04 ENCOUNTER — Encounter: Payer: Self-pay | Admitting: Cardiology

## 2021-07-04 VITALS — BP 134/88 | HR 71 | Ht 69.0 in | Wt 231.1 lb

## 2021-07-04 DIAGNOSIS — I10 Essential (primary) hypertension: Secondary | ICD-10-CM | POA: Diagnosis not present

## 2021-07-04 DIAGNOSIS — I5022 Chronic systolic (congestive) heart failure: Secondary | ICD-10-CM

## 2021-07-04 DIAGNOSIS — E669 Obesity, unspecified: Secondary | ICD-10-CM | POA: Insufficient documentation

## 2021-07-04 DIAGNOSIS — I420 Dilated cardiomyopathy: Secondary | ICD-10-CM | POA: Diagnosis not present

## 2021-07-04 HISTORY — DX: Obesity, unspecified: E66.9

## 2021-07-04 MED ORDER — ENTRESTO 24-26 MG PO TABS: 1.0000 | ORAL_TABLET | Freq: Two times a day (BID) | ORAL | 3 refills | Status: DC

## 2021-07-04 NOTE — Progress Notes (Signed)
Cardiology Office Note:    Date:  07/04/2021   ID:  Alfred Martin, DOB 01/29/1966, MRN [id number]  PCP:  [name], DO  Cardiologist:  Berniece Salines, DO  Electrophysiologist:  None   Referring MD: Carollee Herter, [name], *    History of Present Illness:    Alfred Martin is a 55 y.o. male with a hx of heart failure with reduced ejection fraction most recent EF 25 to 30% which was done in June 2022, initial diagnosis of cardiomyopathy  his EF was 20 to 25% during that time he underwent a left heart catheterization which did not show any evidence of coronary artery disease, he got an echocardiogram in 2016 showing a EF of 20 to 25%, he did follow-up with [facility] cardiology and per records in 2017 he underwent a stress echo which was negative at the time his EF was 40%.  I saw the patient on May 14, 2021 at that time we repeated his echocardiogram as well as he had some PVCs I placed a monitor on the patient. During that visit I stopped his benazepril and started the patient on Entresto with instruction on how to transition. Unfortunately the patient did not transition from benazepril to [facility] as advised.  He said that he did not trust what he read about Entresto.  He was able to get his echocardiogram we did show evidence that his EF was 25 to 30%.  He wore the ZIO monitor which show occasional PACs.   Past Medical History:  Diagnosis Date   Allergy    CHF (congestive heart failure) (HCC)    Gout    HTN (hypertension)    Hyperlipidemia    diet controlled and exercise   Joint pain     Past Surgical History:  Procedure Laterality Date   TONSILLECTOMY      Current Medications: Current Meds  Medication Sig   ACCU-CHEK GUIDE test strip USE AS DIRECTED UP TO 4 TIMES A DAY TO CHECK BLOOD SUGAR   Accu-Chek Softclix Lancets lancets USE TO CHECK GLUCOSE THREE TIMES DAILY   allopurinol (ZYLOPRIM) 300 MG tablet TAKE 1 TABLET BY MOUTH EVERY DAY   amLODipine (NORVASC) 10 MG  tablet Take 1 tablet (10 mg total) by mouth daily.   blood glucose meter kit and supplies KIT Dispense based on patient and insurance preference. Use up to four times daily as directed.   fluticasone (FLONASE) 50 MCG/ACT nasal spray Place 2 sprays into both nostrils daily.   hydrochlorothiazide (HYDRODIURIL) 25 MG tablet TAKE 1 TABLET BY MOUTH EVERY DAY   hydroquinone 4 % cream APPLY TO AFFECTED AREA TWICE A DAY   levocetirizine (XYZAL) 5 MG tablet TAKE 1 TABLET BY MOUTH EVERY DAY IN THE EVENING   methocarbamol (ROBAXIN) 500 MG tablet Take 1 tablet (500 mg total) by mouth every 6 (six) hours as needed for muscle spasms.   metoprolol succinate (TOPROL-XL) 25 MG 24 hr tablet TAKE 1 TABLET BY MOUTH EVERY DAY   oxyCODONE (ROXICODONE) 5 MG immediate release tablet Take 1 tablet (5 mg total) by mouth every 8 (eight) hours as needed.   predniSONE (DELTASONE) 10 MG tablet Take 1 tablet (10 mg total) by mouth daily with breakfast. Take 4 pills daily for 2 days then 3 pills daily for 2 days then 2 pills daily for 2 days then 1 pill daily for 2 days then stop   sacubitril-valsartan (ENTRESTO) 24-26 MG Take 1 tablet by mouth 2 (two)  times daily.   sildenafil (VIAGRA) 100 MG tablet Take 0.5-1 tablets (50-100 mg total) by mouth daily as needed for erectile dysfunction.   Sod Picosulfate-Mag Ox-Cit Acd (CLENPIQ) 10-3.5-12 MG-GM -GM/160ML SOLN Take 1 kit by mouth as directed.   [DISCONTINUED] rosuvastatin (CRESTOR) 40 MG tablet Take 1 tablet (40 mg total) by mouth daily.     Allergies:   Patient has no known allergies.   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, prison and probation services  Tobacco Use   Smoking status: Never   Smokeless tobacco: Never  Vaping Use    Vaping Use: Never used  Substance and Sexual Activity   Alcohol use: No    Alcohol/week: 0.0 standard drinks   Drug use: No   Sexual activity: Yes  Other Topics Concern   Not on file  Social History Narrative   Exercise-- jogging 3-4 days a week   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   Social Determinants of Radio broadcast assistant Strain: Not on file  Food Insecurity: Not on file  Transportation Needs: Not on file  Physical Activity: Not on file  Stress: Not on file  Social Connections: Not on file     Family History: The patient's family history includes Arthritis in his maternal aunt, maternal aunt, maternal aunt, and mother; Breast cancer in his maternal aunt and paternal grandmother; Colon cancer in his paternal uncle; Congestive Heart Failure in his brother and paternal uncle; Diabetes in his brother, father, and mother; Drug abuse in his brother; HIV/AIDS in his brother; Heart disease in his maternal grandmother; High blood pressure in his father and mother; Hyperlipidemia in his father and mother; Hypertension in his brother, brother, father, and mother; Liver disease in his father; Obesity in his mother; Stroke in his paternal uncle. There is no history of Colon polyps, Rectal cancer, or Stomach cancer.  ROS:   Review of Systems  Constitution: Negative for decreased appetite, fever and weight gain.  HENT: Negative for congestion, ear discharge, hoarse voice and sore throat.   Eyes: Negative for discharge, redness, vision loss in right eye and visual halos.  Cardiovascular: Negative for chest pain, dyspnea on exertion, leg swelling, orthopnea and palpitations.  Respiratory: Negative for cough, hemoptysis, shortness of breath and snoring.   Endocrine: Negative for heat intolerance and polyphagia.  Hematologic/Lymphatic: Negative for bleeding problem. Does not bruise/bleed easily.  Skin: Negative for flushing, nail changes, rash and suspicious lesions.  Musculoskeletal: Negative  for arthritis, joint pain, muscle cramps, myalgias, neck pain and stiffness.  Gastrointestinal: Negative for abdominal pain, bowel incontinence, diarrhea and excessive appetite.  Genitourinary: Negative for decreased libido, genital sores and incomplete emptying.  Neurological: Negative for brief paralysis, focal weakness, headaches and loss of balance.  Psychiatric/Behavioral: Negative for altered mental status, depression and suicidal ideas.  Allergic/Immunologic: Negative for HIV exposure and persistent infections.    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  Zio monitor Patch Wear Time:  3 days and 1 hours starting May 14, 2021 Indication, premature ventricular complex   Patient had a minimum HR of 48 bpm, maximum HR of 159 bpm, and average HR of 70 bpm.   Predominant underlying rhythm was Sinus Rhythm.   Premature atrial complexes were rare. Premature ventricular complexes were occasional (3.2%, 9735).   Symptoms associated with sinus rhythm.   No ventricular tachycardia, no pauses, no atrial fibrillation noted.   Conclusion: Occasional premature ventricular complexes   TTE 05/24/2021 Left ventricular ejection fraction, by estimation, is 25 to 30%. The left ventricle has severely decreased function. The left ventricle has no regional wall motion abnormalities. The left ventricular internal cavity  size was moderately dilated. Left  ventricular diastolic parameters are indeterminate.   2. Right ventricular systolic function is normal. The right ventricular  size is normal. There is normal pulmonary artery systolic pressure.   3. The mitral valve is normal in structure. Mild mitral valve  regurgitation. No evidence of mitral  stenosis.   4. The aortic valve is normal in structure. Aortic valve regurgitation is  not visualized. No aortic stenosis is present.   5. The inferior vena cava is normal in size with greater than 50%  respiratory variability, suggesting right atrial pressure of 3 mmHg.   FINDINGS   Left Ventricle: Left ventricular ejection fraction, by estimation, is 25  to 30%. The left ventricle has severely decreased function. The left  ventricle has no regional wall motion abnormalities. The left ventricular  internal cavity size was moderately  dilated. There is no left ventricular hypertrophy. Left ventricular  diastolic parameters are indeterminate.   Right Ventricle: The right ventricular size is normal. No increase in  right ventricular wall thickness. Right ventricular systolic function is  normal. There is normal pulmonary artery systolic pressure. The tricuspid  regurgitant velocity is 2.67 m/s, and   with an assumed right atrial pressure of 3 mmHg, the estimated right  ventricular systolic pressure is 50.5 mmHg.   Left Atrium: Left atrial size was normal in size.   Right Atrium: Right atrial size was normal in size.   Pericardium: There is no evidence of pericardial effusion.   Mitral Valve: The mitral valve is normal in structure. Mild mitral valve  regurgitation. No evidence of mitral valve stenosis.   Tricuspid Valve: The tricuspid valve is normal in structure. Tricuspid  valve regurgitation is mild . No evidence of tricuspid stenosis.   Aortic Valve: The aortic valve is normal in structure. Aortic valve  regurgitation is not visualized. No aortic stenosis is present. Aortic  valve mean gradient measures 3.0 mmHg. Aortic valve peak gradient measures  5.2 mmHg. Aortic valve area, by VTI  measures 1.81 cm.   Pulmonic Valve: The pulmonic valve was normal in structure. Pulmonic valve  regurgitation is not visualized. No evidence of pulmonic stenosis.   Aorta: The aortic root  is normal in size and structure.   Venous: The inferior vena cava is normal in size with greater than 50%  respiratory variability, suggesting right atrial pressure of 3 mmHg.   IAS/Shunts: No atrial level shunt detected by color flow Doppler.   Recent Labs: 04/03/2021: TSH 1.47 05/16/2021: ALT 75 05/17/2021: Hemoglobin 13.6; Platelets 177 05/18/2021: BUN 17; Creatinine, Ser 1.46; Potassium 3.4; Sodium 138  Recent Lipid Panel    Component Value Date/Time   CHOL 146 05/18/2021 0525   TRIG 88 05/18/2021 0525   HDL 44 05/18/2021 0525   CHOLHDL 3.3 05/18/2021 0525   VLDL  18 05/18/2021 0525   LDLCALC 84 05/18/2021 0525   LDLCALC 128 (H) 05/22/2018 1636   LDLDIRECT 127.0 03/18/2019 1503    Physical Exam:    VS:  BP 134/88 (BP Location: Right Arm, Patient Position: Sitting, Cuff Size: Large)   Pulse 71   Ht _0  (1.753 m)   Wt 231 lb 1.9 oz (104.8 kg)   SpO2 98%   BMI 34.13 kg/m     Wt Readings from Last 3 Encounters:  07/04/21 231 lb 1.9 oz (104.8 kg)  06/20/21 230 lb 4 oz (104.4 kg)  05/16/21 237 lb (107.5 kg)     GEN: Well nourished, well developed in no acute distress HEENT: Normal NECK: No JVD; No carotid bruits LYMPHATICS: No lymphadenopathy CARDIAC: S1S2 noted,RRR, no murmurs, rubs, gallops RESPIRATORY:  Clear to auscultation without rales, wheezing or rhonchi  ABDOMEN: Soft, non-tender, non-distended, +bowel sounds, no guarding. EXTREMITIES: No edema, No cyanosis, no clubbing MUSCULOSKELETAL:  No deformity  SKIN: Warm and dry NEUROLOGIC:  Alert and oriented x 3, non-focal PSYCHIATRIC:  Normal affect, good insight  ASSESSMENT:    1. Dilated cardiomyopathy ([place])   2. Hypertension, unspecified type   3. Chronic HFrEF (heart failure with reduced ejection fraction) (HCC)   4. Obesity (BMI 30-39.9)    PLAN:     His visit today consisted of educating the patient about the goal for guideline directed medication in the presence of his dilated nonischemic  cardiomyopathy.  He had not started [facility] as he he sent a prior note reporting that he did not want to change due to things that he had read and did not trust the use of Entresto.  I spent time educating the patient about Delene Loll informing him that this medication is a better use for his dilated cardiomyopathy/heart failure then the use of benazepril.  I explained his testing results to him including the echo as well as the ZIO monitor which showed occasional PAC VCs.  I did advise the patient that he is going to need to be on guideline directed medical therapy which will include Entresto, Aldactone as well as [place] for now.  During this counseling I advised the patient that once on optimal medical therapy we will need to have repeat imaging which can be echocardiogram or cardiac MRI.  I also advised the patient that if his EF does not improve we will need to start discussing the use of defibrillator.  Unfortunately he says "I will die first before I use a defibrillator".  I would like to refer the patient to my heart failure colleagues but he has declined for now.  Therefore for now he is excepted to start [facility] we will start him back on Entresto 24-26 mg twice daily.  He will follow-up in 6 weeks for possible titration and addition of Aldactone.  The patient understands the need to lose weight with diet and exercise. We have discussed specific strategies for this.  Blood pressure is acceptable in the office.  We will continue to monitor.  The patient is in agreement with the above plan. The patient left the office in stable condition.  The patient will follow up in 6 weeks.   Medication Adjustments/Labs and Tests Ordered: Current medicines are reviewed at length with the patient today.  Concerns regarding medicines are outlined above.  Orders Placed This Encounter  Procedures   EKG 12-Lead   Meds ordered this encounter  Medications   sacubitril-valsartan (ENTRESTO) 24-26 MG     Sig: Take  1 tablet by mouth 2 (two) times daily.    Dispense:  180 tablet    Refill:  3    Patient Instructions  Medication Instructions:  Your physician has recommended you make the following change in your medication:  START: Entresto 24-46 mg twice daily *If you need a refill on your cardiac medications before your next appointment, please call your pharmacy*   Lab Work: None If you have labs (blood work) drawn today and your tests are completely normal, you will receive your results only by: [place] (if you have MyChart) OR A paper copy in the mail If you have any lab test that is abnormal or we need to change your treatment, we will call you to review the results.   Testing/Procedures: None   Follow-Up: At [facility], you and your health needs are our priority.  As part of our continuing mission to provide you with exceptional heart care, we have created designated Provider Care Teams.  These Care Teams include your primary Cardiologist (physician) and Advanced Practice Providers (APPs -  Physician Assistants and Nurse Practitioners) who all work together to provide you with the care you need, when you need it.  We recommend signing up for the patient portal called "MyChart".  Sign up information is provided on this After Visit Summary.  MyChart is used to connect with patients for Virtual Visits (Telemedicine).  Patients are able to view lab/test results, encounter notes, upcoming appointments, etc.  Non-urgent messages can be sent to your provider as well.   To learn more about what you can do with MyChart, go to [email or web address].    Your next appointment:   6 week(s)  The format for your next appointment:   In Person  Provider:   Berniece Salines, DO   Other Instructions    Adopting a Healthy Lifestyle.  Know what a healthy weight is for you (roughly BMI <25) and aim to maintain this   Aim for 7+ servings of fruits and vegetables daily   65-80+  fluid ounces of water or unsweet tea for healthy kidneys   Limit to max 1 drink of alcohol per day; avoid smoking/tobacco   Limit animal fats in diet for cholesterol and heart health - choose grass fed whenever available   Avoid highly processed foods, and foods high in saturated/trans fats   Aim for low stress - take time to unwind and care for your mental health   Aim for 150 min of moderate intensity exercise weekly for heart health, and weights twice weekly for bone health   Aim for 7-9 hours of sleep daily   When it comes to diets, agreement about the perfect plan isnt easy to find, even among the experts. Experts at the [employer] developed an idea known as the Healthy Eating Plate. Just imagine a plate divided into logical, healthy portions.   The emphasis is on diet quality:   Load up on vegetables and fruits - one-half of your plate: Aim for color and variety, and remember that potatoes dont count.   Go for whole grains - one-quarter of your plate: Whole wheat, barley, wheat berries, quinoa, oats, brown rice, and foods made with them. If you want pasta, go with whole wheat pasta.   Protein power - one-quarter of your plate: Fish, chicken, beans, and nuts are all healthy, versatile protein sources. Limit red meat.   The diet, however, does go beyond the plate, offering a few other suggestions.  Use healthy plant oils, such as olive, canola, soy, corn, sunflower and peanut. Check the labels, and avoid partially hydrogenated oil, which have unhealthy trans fats.   If youre thirsty, drink water. Coffee and tea are good in moderation, but skip sugary drinks and limit milk and dairy products to one or two daily servings.   The type of carbohydrate in the diet is more important than the amount. Some sources of carbohydrates, such as vegetables, fruits, whole grains, and beans-are healthier than others.   Finally, stay active  Signed, Berniece Salines, DO   07/04/2021 9:07 PM    [place]

## 2021-07-04 NOTE — Telephone Encounter (Signed)
Pt c/o medication issue:  1. Name of Medication:  sacubitril-valsartan (ENTRESTO) 24-26 MG  2. How are you currently taking this medication (dosage and times per day)? Not currently taking  3. Are you having a reaction (difficulty breathing--STAT)? No   4. What is your medication issue? Needs PA for this medication.

## 2021-07-04 NOTE — Patient Instructions (Signed)
Medication Instructions:  Your physician has recommended you make the following change in your medication:  START: Entresto 24-46 mg twice daily *If you need a refill on your cardiac medications before your next appointment, please call your pharmacy*   Lab Work: None If you have labs (blood work) drawn today and your tests are completely normal, you will receive your results only by: [place] (if you have MyChart) OR A paper copy in the mail If you have any lab test that is abnormal or we need to change your treatment, we will call you to review the results.   Testing/Procedures: None   Follow-Up: At [facility], you and your health needs are our priority.  As part of our continuing mission to provide you with exceptional heart care, we have created designated Provider Care Teams.  These Care Teams include your primary Cardiologist (physician) and Advanced Practice Providers (APPs -  Physician Assistants and Nurse Practitioners) who all work together to provide you with the care you need, when you need it.  We recommend signing up for the patient portal called "MyChart".  Sign up information is provided on this After Visit Summary.  MyChart is used to connect with patients for Virtual Visits (Telemedicine).  Patients are able to view lab/test results, encounter notes, upcoming appointments, etc.  Non-urgent messages can be sent to your provider as well.   To learn more about what you can do with MyChart, go to [email or web address].    Your next appointment:   6 week(s)  The format for your next appointment:   In Person  Provider:   Berniece Salines, DO   Other Instructions

## 2021-07-12 NOTE — Telephone Encounter (Signed)
Prior Authorization sent for [employer]: Alfred Martin - [place] Case ID: [id number]

## 2021-07-13 NOTE — Telephone Encounter (Signed)
Prior authorization approved for Entresto 24-26 mg  Alfred Martin Key: [id number] - [place] Case ID: [id number] Outcome Approved on August 11  Patient made aware of approval

## 2021-07-16 IMAGING — DX DG CHEST 1V PORT
1 series · 1 of 1 positions shown · non-contrast
Comparison: None.
COMPARISON: None.

Addendum:
CLINICAL DATA: 55-year-old male with back pain.

EXAM:
PORTABLE CHEST 1 VIEW

[chest ap]
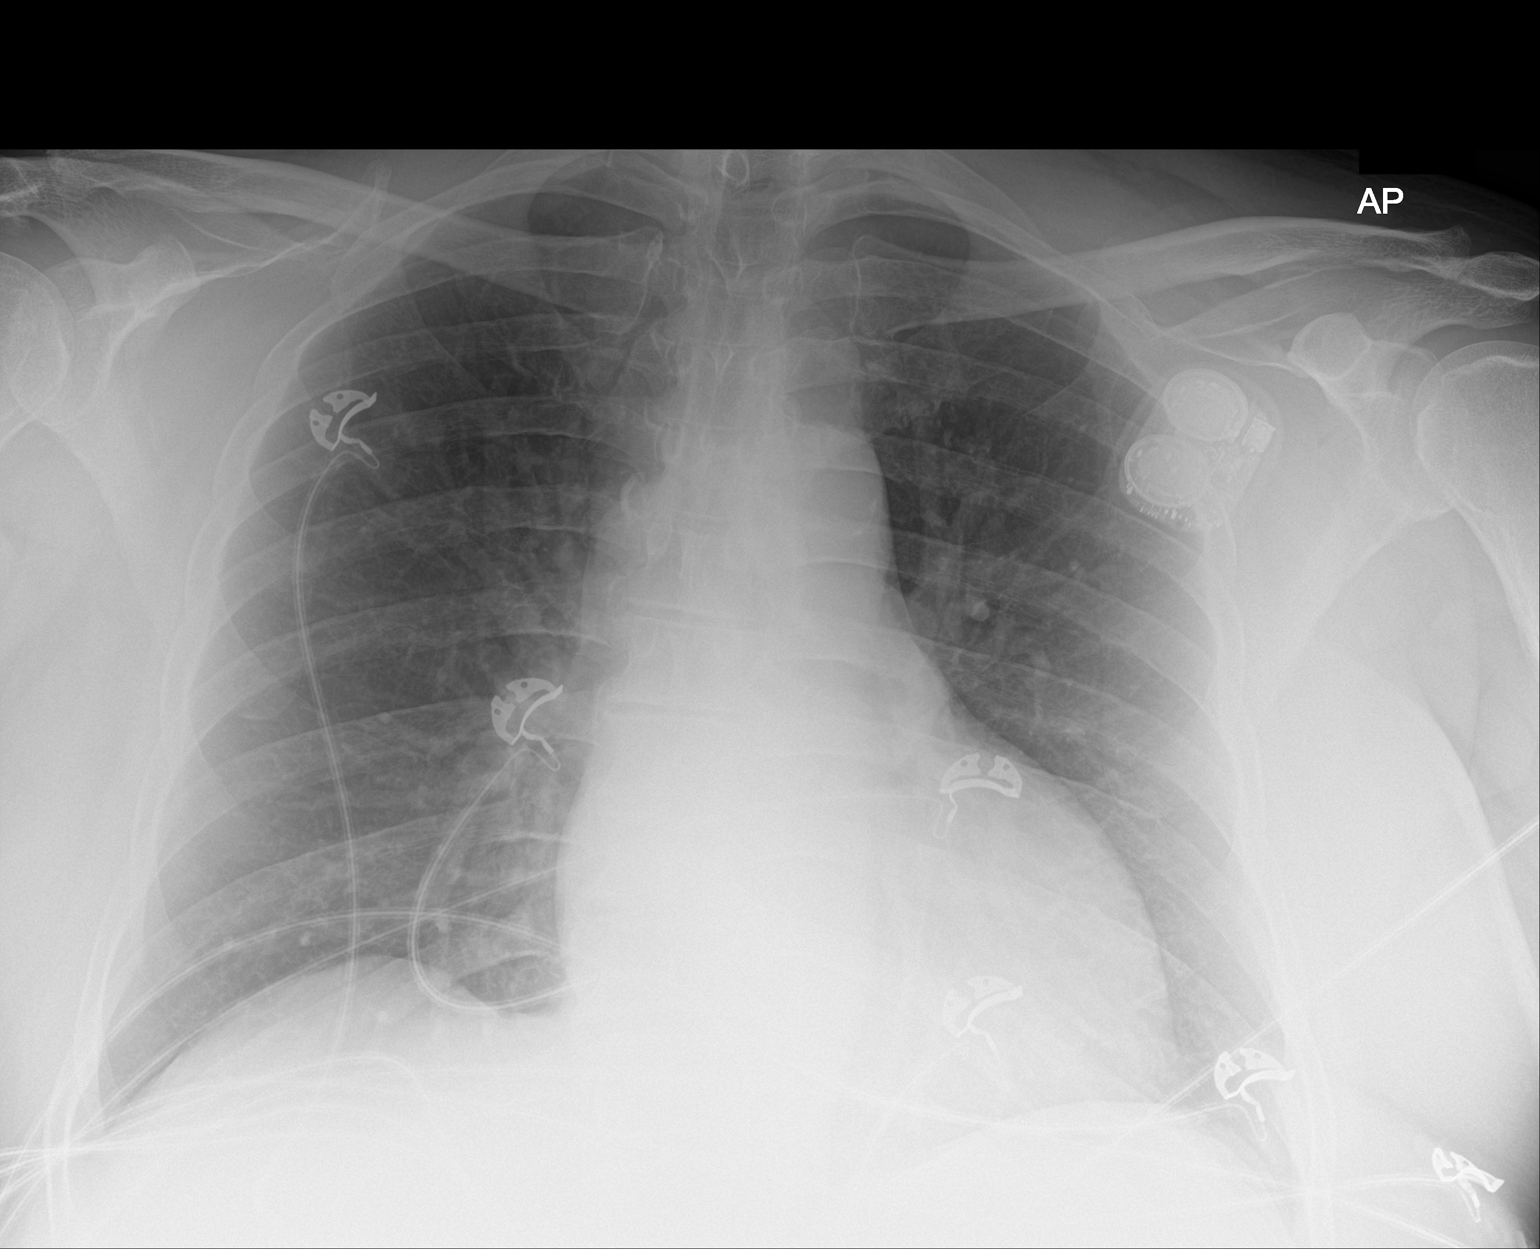

[1 of 1 positions shown; findings below may reference images not displayed]

FINDINGS: The lungs are clear. There is no pleural effusion pneumothorax. Mild
cardiomegaly. Left pectoral pacemaker device. No acute osseous
pathology.
IMPRESSION: No active disease.

ADDENDUM:
Please note the cardiac device is an external cardiac monitor and
not a pacemaker.

*** End of Addendum ***
FINDINGS: The lungs are clear. There is no pleural effusion pneumothorax. Mild
cardiomegaly. Left pectoral pacemaker device. No acute osseous
pathology.
IMPRESSION: No active disease.

## 2021-07-31 ENCOUNTER — Encounter: Payer: Self-pay | Admitting: Internal Medicine

## 2021-07-31 ENCOUNTER — Ambulatory Visit: Payer: BC Managed Care – PPO | Admitting: Internal Medicine

## 2021-07-31 ENCOUNTER — Other Ambulatory Visit: Payer: Self-pay

## 2021-07-31 VITALS — BP 138/90 | HR 77 | Ht 69.0 in | Wt 227.4 lb

## 2021-07-31 DIAGNOSIS — E119 Type 2 diabetes mellitus without complications: Secondary | ICD-10-CM

## 2021-07-31 HISTORY — DX: Type 2 diabetes mellitus without complications: E11.9

## 2021-07-31 LAB — POCT GLYCOSYLATED HEMOGLOBIN (HGB A1C): Hemoglobin A1C: 6 % — AB (ref 4.0–5.6)

## 2021-07-31 NOTE — Patient Instructions (Signed)
-   You have done AMAZING ! Keep up the Good Work !

## 2021-07-31 NOTE — Progress Notes (Signed)
Name: Alfred Martin  Age/ Sex: 55 y.o., male   MRN/ DOB: [id number], 04/22/1966     PCP: [name], DO   Reason for Endocrinology Evaluation: Type 2 Diabetes Mellitus  Initial Endocrine Consultative Visit: 05/01/2021    PATIENT IDENTIFIER: Alfred Martin is a 55 y.o. male with a past medical history of T2DM, HTN, Cardiomyopathy and CHF. The patient has followed with Endocrinology clinic since 05/01/2021 for consultative assistance with management of his diabetes.  DIABETIC HISTORY:  Alfred Martin was diagnosed with DM in 2020, he was prescribed Rybelsus and Tresiba in 04/2021 but did not start due to severe blurry vision. His hemoglobin A1c has ranged from 6.5% in 2020, peaking at 13.8% in 2022.   On his initial visit his A1c was 13.8%. He was not taking Rybelsus due to blurry vision, he was also not taking [place]. His BG's have been in the low 100's after he made major lifestyle changes and we opted to monitor.    SUBJECTIVE:   During the last visit (05/01/2021): A1c 13.8% BG's trending down and we opted to monitor.   Today (07/31/2021): Alfred Martin is here for a follow up on diabetes management.  He checks his blood sugars 2 times daily. The patient has not had hypoglycemic episodes since the last clinic visit  Pt was hospitalized 05/2021 for AKI  due to ibuprofen due to cervical DJD    His vision was improved tremendously   HOME DIABETES REGIMEN:  N/A     Statin: yes ACE-I/ARB: yes Prior Diabetic Education: no   METER DOWNLOAD SUMMARY: Date range evaluated: 8/16-8/30/2022 Average Number Tests/Day = 1.9 Overall Mean FS Glucose = 109 Standard Deviation = 9  BG Ranges: Low = 94 High = 130   Hypoglycemic Events/30 Days: BG < 50 = 0 Episodes of symptomatic severe hypoglycemia = 0    DIABETIC COMPLICATIONS: Microvascular complications:   Denies: CKD, retinopathy, neuropathy Last Eye Exam: Completed 05/2021  Macrovascular complications:   Denies: CAD, CVA,  PVD   HISTORY:  Past Medical History:  Past Medical History:  Diagnosis Date   Allergy    CHF (congestive heart failure) (HCC)    Gout    HTN (hypertension)    Hyperlipidemia    diet controlled and exercise   Joint pain    Past Surgical History:  Past Surgical History:  Procedure Laterality Date   TONSILLECTOMY     Social History:  reports that he has never smoked. He has never used smokeless tobacco. He reports that he does not drink alcohol and does not use drugs. Family History:  Family History  Problem Relation Age of Onset   Arthritis Mother        Severe-- RA   Diabetes Mother    Hyperlipidemia Mother    High blood pressure Mother    Hypertension Mother    Obesity Mother    Colon cancer Paternal Uncle    Congestive Heart Failure Paternal Uncle    Stroke Paternal Uncle    Diabetes Father    Hyperlipidemia Father    High blood pressure Father    Hypertension Father    Liver disease Father    Diabetes Brother    Hypertension Brother    HIV/AIDS Brother    Breast cancer Maternal Aunt    Arthritis Maternal Aunt        RA   Hypertension Brother    Drug abuse Brother        chf-- crack /  cocaine   Congestive Heart Failure Brother    Arthritis Maternal Aunt    Arthritis Maternal Aunt    Heart disease Maternal Grandmother    Breast cancer Paternal Grandmother    Colon polyps Neg Hx    Rectal cancer Neg Hx    Stomach cancer Neg Hx      HOME MEDICATIONS: Allergies as of 07/31/2021   No Known Allergies      Medication List        Accurate as of July 31, 2021  8:32 AM. If you have any questions, ask your nurse or doctor.          Accu-Chek Guide test strip Generic drug: glucose blood USE AS DIRECTED UP TO 4 TIMES A DAY TO CHECK BLOOD SUGAR   Accu-Chek Softclix Lancets lancets USE TO CHECK GLUCOSE THREE TIMES DAILY   allopurinol 300 MG tablet Commonly known as: ZYLOPRIM TAKE 1 TABLET BY MOUTH EVERY DAY   amLODipine 10 MG tablet Commonly  known as: NORVASC Take 1 tablet (10 mg total) by mouth daily.   blood glucose meter kit and supplies Kit Dispense based on patient and insurance preference. Use up to four times daily as directed.   Clenpiq 10-3.5-12 MG-GM -GM/160ML Soln Generic drug: Sod Picosulfate-Mag Ox-Cit Acd Take 1 kit by mouth as directed.   Entresto 24-26 MG Generic drug: sacubitril-valsartan Take 1 tablet by mouth 2 (two) times daily.   fluticasone 50 MCG/ACT nasal spray Commonly known as: FLONASE Place 2 sprays into both nostrils daily.   hydrochlorothiazide 25 MG tablet Commonly known as: HYDRODIURIL TAKE 1 TABLET BY MOUTH EVERY DAY   hydroquinone 4 % cream APPLY TO AFFECTED AREA TWICE A DAY   levocetirizine 5 MG tablet Commonly known as: XYZAL TAKE 1 TABLET BY MOUTH EVERY DAY IN THE EVENING   methocarbamol 500 MG tablet Commonly known as: ROBAXIN Take 1 tablet (500 mg total) by mouth every 6 (six) hours as needed for muscle spasms.   metoprolol succinate 25 MG 24 hr tablet Commonly known as: TOPROL-XL TAKE 1 TABLET BY MOUTH EVERY DAY   oxyCODONE 5 MG immediate release tablet Commonly known as: Roxicodone Take 1 tablet (5 mg total) by mouth every 8 (eight) hours as needed.   predniSONE 10 MG tablet Commonly known as: DELTASONE Take 1 tablet (10 mg total) by mouth daily with breakfast. Take 4 pills daily for 2 days then 3 pills daily for 2 days then 2 pills daily for 2 days then 1 pill daily for 2 days then stop   rosuvastatin 40 MG tablet Commonly known as: CRESTOR TAKE 1 TABLET BY MOUTH EVERY DAY   sildenafil 100 MG tablet Commonly known as: VIAGRA Take 0.5-1 tablets (50-100 mg total) by mouth daily as needed for erectile dysfunction.         OBJECTIVE:   Vital Signs: BP 138/90 (BP Location: Left Arm, Patient Position: Sitting, Cuff Size: Small)   Pulse 77   Ht _0  (1.753 m)   Wt 227 lb 6.4 oz (103.1 kg)   SpO2 99%   BMI 33.58 kg/m   Wt Readings from Last 3 Encounters:   07/31/21 227 lb 6.4 oz (103.1 kg)  07/04/21 231 lb 1.9 oz (104.8 kg)  06/20/21 230 lb 4 oz (104.4 kg)     Exam: General: Pt appears well and is in NAD  Neck: General: Supple without adenopathy. Thyroid: Thyroid size normal.  No goiter or nodules appreciated.   Lungs: Clear with good BS  bilat with no rales, rhonchi, or wheezes  Heart: RRR with normal S1 and S2 and no gallops; no murmurs; no rub  Abdomen: Normoactive bowel sounds, soft, nontender, without masses or organomegaly palpable  Extremities: No pretibial edema.   Neuro: MS is good with appropriate affect, pt is alert and Ox3    DM foot exam: 05/01/2021   The skin of the feet is intact without sores or ulcerations. The pedal pulses are 2+ on right and 2+ on left. The sensation is intact to a screening 5.07, 10 gram monofilament bilaterally    DATA REVIEWED:  Lab Results  Component Value Date   HGBA1C 6.0 (A) 07/31/2021   HGBA1C 13.8 (H) 04/03/2021   HGBA1C 6.6 (H) 09/21/2019   Lab Results  Component Value Date   MICROALBUR 1.1 04/03/2021   LDLCALC 84 05/18/2021   CREATININE 1.46 (H) 05/18/2021   Results for GEORGIOS, KINA (MRN [id number]) as of 07/31/2021 08:30  Ref. Range 05/18/2021 05:25  Sodium Latest Ref Range: 135 - 145 mmol/L 138  Potassium Latest Ref Range: 3.5 - 5.1 mmol/L 3.4 (L)  Chloride Latest Ref Range: 98 - 111 mmol/L 105  CO2 Latest Ref Range: 22 - 32 mmol/L 28  Glucose Latest Ref Range: 70 - 99 mg/dL 97  BUN Latest Ref Range: 6 - 20 mg/dL 17  Creatinine Latest Ref Range: 0.61 - 1.24 mg/dL 1.46 (H)  Calcium Latest Ref Range: 8.9 - 10.3 mg/dL 9.0  Anion gap Latest Ref Range: 5 - 15  5  GFR, Estimated Latest Ref Range: >60 mL/min 56 (L)  Total CHOL/HDL Ratio Latest Units: RATIO 3.3  Cholesterol Latest Ref Range: 0 - 200 mg/dL 146  HDL Cholesterol Latest Ref Range: >40 mg/dL 44  LDL (calc) Latest Ref Range: 0 - 99 mg/dL 84  Triglycerides Latest Ref Range: <150 mg/dL 88  VLDL Latest Ref Range: 0 -  40 mg/dL 18    ASSESSMENT / PLAN / RECOMMENDATIONS:   1) Type 2 Diabetes Mellitus, Optimally controlled, With out complications - Most recent A1c of 6.0 %. Goal A1c < 7.0 %.     - I have praised the pt on improved glycemic control with just lifestyle changes - He is very motivated and I have encouraged him to continue with exercise and avoiding sugar sweetened beverages    MEDICATIONS: N/A  EDUCATION / INSTRUCTIONS: BG monitoring instructions: Patient is instructed to check his blood sugars 2-3 times a week Call [facility] if: BG persistently < 70  I reviewed the Rule of 15 for the treatment of hypoglycemia in detail with the patient. Literature supplied.     2) Diabetic complications:  Eye: Does not have known diabetic retinopathy.  Neuro/ Feet: Does not have known diabetic peripheral neuropathy .  Renal: Patient does not have known baseline CKD. He   is on an ACEI/ARB at present.   3) Dyslipidemia : LDL at goal. Pt on Rosuvastatin 40 mg daily    F/U in 6 months     Signed electronically by: Mack Guise, MD  [facility]  [facility] [address] [place], [place] [address] Phone: [phone] FAX: [phone]   CC: [name], DO [address] [place] [address] Phone: [phone]  Fax: [phone]  Return to Endocrinology clinic as below: Future Appointments  Date Time Provider [place]  07/31/2021  9:10 AM Aerika Groll, Melanie Crazier, MD [facility]  08/23/2021  8:00 AM  Tobb, Kardie, DO [facility] None  10/04/2021  8:40 AM Lowne Koren Shiver, DO [facility]

## 2021-08-04 ENCOUNTER — Other Ambulatory Visit: Payer: Self-pay | Admitting: Family Medicine

## 2021-08-21 ENCOUNTER — Other Ambulatory Visit: Payer: Self-pay

## 2021-08-23 ENCOUNTER — Ambulatory Visit: Payer: BC Managed Care – PPO | Admitting: Cardiology

## 2021-08-29 ENCOUNTER — Encounter: Payer: Self-pay | Admitting: Gastroenterology

## 2021-08-31 ENCOUNTER — Other Ambulatory Visit: Payer: Self-pay | Admitting: Family Medicine

## 2021-08-31 DIAGNOSIS — I10 Essential (primary) hypertension: Secondary | ICD-10-CM

## 2021-09-03 ENCOUNTER — Encounter (HOSPITAL_COMMUNITY): Payer: Self-pay | Admitting: Gastroenterology

## 2021-09-12 ENCOUNTER — Encounter (HOSPITAL_COMMUNITY): Payer: Self-pay | Admitting: Gastroenterology

## 2021-09-12 ENCOUNTER — Ambulatory Visit (HOSPITAL_COMMUNITY): Payer: BC Managed Care – PPO | Admitting: Registered Nurse

## 2021-09-12 ENCOUNTER — Other Ambulatory Visit: Payer: Self-pay

## 2021-09-12 ENCOUNTER — Encounter (HOSPITAL_COMMUNITY)
Admission: RE | Disposition: A | Discharge status: Home / Self Care | Payer: Self-pay | Source: Home / Self Care | Attending: Gastroenterology

## 2021-09-12 ENCOUNTER — Ambulatory Visit (HOSPITAL_COMMUNITY)
Admission: RE | Admit: 2021-09-12 | Discharge: 2021-09-12 | Disposition: A | Discharge status: Home / Self Care | Payer: BC Managed Care – PPO | Attending: Gastroenterology | Admitting: Gastroenterology

## 2021-09-12 DIAGNOSIS — K640 First degree hemorrhoids: Secondary | ICD-10-CM

## 2021-09-12 DIAGNOSIS — E669 Obesity, unspecified: Secondary | ICD-10-CM

## 2021-09-12 DIAGNOSIS — K921 Melena: Secondary | ICD-10-CM | POA: Diagnosis present

## 2021-09-12 DIAGNOSIS — C187 Malignant neoplasm of sigmoid colon: Secondary | ICD-10-CM | POA: Diagnosis not present

## 2021-09-12 DIAGNOSIS — Z6833 Body mass index (BMI) 33.0-33.9, adult: Secondary | ICD-10-CM | POA: Diagnosis not present

## 2021-09-12 DIAGNOSIS — D125 Benign neoplasm of sigmoid colon: Secondary | ICD-10-CM

## 2021-09-12 DIAGNOSIS — Z8379 Family history of other diseases of the digestive system: Secondary | ICD-10-CM | POA: Insufficient documentation

## 2021-09-12 DIAGNOSIS — K621 Rectal polyp: Secondary | ICD-10-CM

## 2021-09-12 DIAGNOSIS — Z833 Family history of diabetes mellitus: Secondary | ICD-10-CM | POA: Insufficient documentation

## 2021-09-12 DIAGNOSIS — Z79899 Other long term (current) drug therapy: Secondary | ICD-10-CM | POA: Diagnosis not present

## 2021-09-12 DIAGNOSIS — Z8349 Family history of other endocrine, nutritional and metabolic diseases: Secondary | ICD-10-CM | POA: Insufficient documentation

## 2021-09-12 DIAGNOSIS — D123 Benign neoplasm of transverse colon: Secondary | ICD-10-CM

## 2021-09-12 DIAGNOSIS — Z803 Family history of malignant neoplasm of breast: Secondary | ICD-10-CM | POA: Diagnosis not present

## 2021-09-12 DIAGNOSIS — Z8249 Family history of ischemic heart disease and other diseases of the circulatory system: Secondary | ICD-10-CM | POA: Insufficient documentation

## 2021-09-12 DIAGNOSIS — D124 Benign neoplasm of descending colon: Secondary | ICD-10-CM | POA: Diagnosis not present

## 2021-09-12 DIAGNOSIS — K5730 Diverticulosis of large intestine without perforation or abscess without bleeding: Secondary | ICD-10-CM | POA: Diagnosis not present

## 2021-09-12 DIAGNOSIS — Z1211 Encounter for screening for malignant neoplasm of colon: Secondary | ICD-10-CM | POA: Diagnosis not present

## 2021-09-12 DIAGNOSIS — R195 Other fecal abnormalities: Secondary | ICD-10-CM

## 2021-09-12 DIAGNOSIS — I130 Hypertensive heart and chronic kidney disease with heart failure and stage 1 through stage 4 chronic kidney disease, or unspecified chronic kidney disease: Secondary | ICD-10-CM | POA: Insufficient documentation

## 2021-09-12 DIAGNOSIS — Z6834 Body mass index (BMI) 34.0-34.9, adult: Secondary | ICD-10-CM

## 2021-09-12 DIAGNOSIS — D120 Benign neoplasm of cecum: Secondary | ICD-10-CM | POA: Insufficient documentation

## 2021-09-12 DIAGNOSIS — D122 Benign neoplasm of ascending colon: Secondary | ICD-10-CM

## 2021-09-12 DIAGNOSIS — E1122 Type 2 diabetes mellitus with diabetic chronic kidney disease: Secondary | ICD-10-CM | POA: Diagnosis not present

## 2021-09-12 DIAGNOSIS — I5022 Chronic systolic (congestive) heart failure: Secondary | ICD-10-CM | POA: Diagnosis not present

## 2021-09-12 DIAGNOSIS — Z823 Family history of stroke: Secondary | ICD-10-CM | POA: Diagnosis not present

## 2021-09-12 DIAGNOSIS — Z1212 Encounter for screening for malignant neoplasm of rectum: Secondary | ICD-10-CM

## 2021-09-12 DIAGNOSIS — K648 Other hemorrhoids: Secondary | ICD-10-CM | POA: Insufficient documentation

## 2021-09-12 DIAGNOSIS — N182 Chronic kidney disease, stage 2 (mild): Secondary | ICD-10-CM | POA: Diagnosis not present

## 2021-09-12 DIAGNOSIS — Z8261 Family history of arthritis: Secondary | ICD-10-CM | POA: Diagnosis not present

## 2021-09-12 HISTORY — PX: COLONOSCOPY WITH PROPOFOL: SHX5780

## 2021-09-12 HISTORY — PX: POLYPECTOMY: SHX5525

## 2021-09-12 HISTORY — PX: SUBMUCOSAL TATTOO INJECTION: SHX6856

## 2021-09-12 SURGERY — COLONOSCOPY WITH PROPOFOL
Anesthesia: Monitor Anesthesia Care

## 2021-09-12 MED ORDER — PROPOFOL 1000 MG/100ML IV EMUL
INTRAVENOUS | Status: AC
  Filled 2021-09-12: qty 100

## 2021-09-12 MED ORDER — PROPOFOL 10 MG/ML IV BOLUS
INTRAVENOUS | Status: DC | PRN
  Administered 2021-09-12: 20 mg via INTRAVENOUS
  Administered 2021-09-12: 30 mg via INTRAVENOUS

## 2021-09-12 MED ORDER — PROPOFOL 500 MG/50ML IV EMUL
INTRAVENOUS | Status: DC | PRN
  Administered 2021-09-12: 125 ug/kg/min via INTRAVENOUS

## 2021-09-12 MED ORDER — SPOT INK MARKER SYRINGE KIT
PACK | SUBMUCOSAL | Status: AC
  Filled 2021-09-12: qty 5

## 2021-09-12 MED ORDER — SPOT INK MARKER SYRINGE KIT
PACK | SUBMUCOSAL | Status: DC | PRN
  Administered 2021-09-12: 4 mL via SUBMUCOSAL

## 2021-09-12 MED ORDER — LACTATED RINGERS IV SOLN: Freq: Once | INTRAVENOUS | Status: AC

## 2021-09-12 MED ORDER — SODIUM CHLORIDE 0.9 % IV SOLN: INTRAVENOUS | Status: DC

## 2021-09-12 SURGICAL SUPPLY — 21 items

## 2021-09-12 NOTE — Op Note (Signed)
[facility] Patient Name: Alfred Martin Procedure Date: 09/12/2021 MRN: [id number] Attending MD: Gerrit Heck , MD Date of Birth: [birth year] CSN: [id number] Age: 54 Admit Type: Outpatient Procedure:                Colonoscopy Indications:              Screening for colorectal malignant neoplasm,                            Incidental - Heme positive stool Providers:                Gerrit Heck, MD, Jeanella Cara, RN,                            Tyna Jaksch Technician Referring MD:              Medicines:                Monitored Anesthesia Care Complications:            No immediate complications. Estimated Blood Loss:     Estimated blood loss was minimal. Procedure:                Pre-Anesthesia Assessment:                           - Prior to the procedure, a History and Physical                            was performed, and patient medications and                            allergies were reviewed. The patient's tolerance of                            previous anesthesia was also reviewed. The risks                            and benefits of the procedure and the sedation                            options and risks were discussed with the patient.                            All questions were answered, and informed consent                            was obtained. Prior Anticoagulants: The patient has                            taken no previous anticoagulant or antiplatelet                            agents. ASA Grade Assessment: III - A patient with                            severe  systemic disease. After reviewing the risks                            and benefits, the patient was deemed in                            satisfactory condition to undergo the procedure.                           After obtaining informed consent, the colonoscope                            was passed under direct vision. Throughout the                            procedure,  the patient's blood pressure, pulse, and                            oxygen saturations were monitored continuously. The                            CF-HQ190L ([id number]) Olympus colonoscope was                            introduced through the anus and advanced to the the                            terminal ileum. The colonoscopy was performed                            without difficulty. The patient tolerated the                            procedure well. The quality of the bowel                            preparation was good. The terminal ileum, ileocecal                            valve, appendiceal orifice, and rectum were                            photographed. Scope In: 8:54:46 AM Scope Out: 9:34:48 AM Scope Withdrawal Time: 0 hours 34 minutes 9 seconds  Total Procedure Duration: 0 hours 40 minutes 2 seconds  Findings:      The perianal and digital rectal examinations were normal.      13 sessile polyps were found in the rectum (6), sigmoid colon (1),       descending colon (2), transverse colon (2), ascending colon (1), and       cecum (1). The polyps were 2 to 6 mm in size. These polyps were removed       with a cold snare. Resection and retrieval were complete. Estimated       blood loss was minimal.      A 8 mm  polyp was found in the sigmoid colon. The polyp was       semi-pedunculated. The polyp was removed with a cold snare. Resection       and retrieval were complete. Estimated blood loss was minimal.      A 20 mm polyp was found in the sigmoid colon, located 40 cm from the       anal verge. The polyp was pedunculated. The polyp was removed with a hot       snare. Resection and retrieval were complete. Area 2 cm distal to the       polyp was tattooed with an injection of 2 mL of Spot (carbon black).      A few small-mouthed diverticula were found in the sigmoid colon.      Non-bleeding internal hemorrhoids were found during retroflexion. The       hemorrhoids were small.       The terminal ileum appeared normal. Impression:               - 13 2 to 6 mm polyps in the rectum, in the sigmoid                            colon, in the descending colon, in the transverse                            colon, in the ascending colon and in the cecum,                            removed with a cold snare. Resected and retrieved.                           - One 8 mm polyp in the sigmoid colon, removed with                            a cold snare. Resected and retrieved.                           - One 20 mm polyp in the sigmoid colon, removed                            with a hot snare. Resected and retrieved. Tattooed.                           - Diverticulosis in the sigmoid colon.                           - Non-bleeding internal hemorrhoids.                           - The examined portion of the ileum was normal. Moderate Sedation:      Not Applicable - Patient had care per Anesthesia. Recommendation:           - Patient has a contact number available for                            emergencies. The  signs and symptoms of potential                            delayed complications were discussed with the                            patient. Return to normal activities tomorrow.                            Written discharge instructions were provided to the                            patient.                           - Resume previous diet.                           - Continue present medications.                           - Await pathology results.                           - Repeat colonoscopy for surveillance based on                            pathology results.                           - Return to GI office PRN.                           - Use fiber, for example Citrucel, Fibercon, Konsyl                            or Metamucil.                           - Internal hemorrhoids were noted on this study and                            may be amenable to hemorrhoid band  ligation. If you                            are interested in further treatment of these                            hemorrhoids with band ligation, please contact my                            clinic to set up an appointment for evaluation and                            treatment. Procedure Code(s):        --- Professional ---  45385, Colonoscopy, flexible; with removal of                            tumor(s), polyp(s), or other lesion(s) by snare                            technique                           45381, Colonoscopy, flexible; with directed                            submucosal injection(s), any substance Diagnosis Code(s):        --- Professional ---                           Z12.11, Encounter for screening for malignant                            neoplasm of colon                           K62.1, Rectal polyp                           K63.5, Polyp of colon                           K64.8, Other hemorrhoids                           K57.30, Diverticulosis of large intestine without                            perforation or abscess without bleeding CPT copyright 2019 American Medical Association. All rights reserved. The codes documented in this report are preliminary and upon coder review may  be revised to meet current compliance requirements. Gerrit Heck, MD 09/12/2021 9:44:48 AM Number of Addenda: 0

## 2021-09-12 NOTE — Interval H&P Note (Signed)
History and Physical Interval Note:  09/12/2021 7:45 AM  Alfred Martin  has presented today for surgery, with the diagnosis of colorectal cancer screening. low ejection fracture.  The various methods of treatment have been discussed with the patient and family. After consideration of risks, benefits and other options for treatment, the patient has consented to  Procedure(s): COLONOSCOPY WITH PROPOFOL (N/A) as a surgical intervention.  The patient's history has been reviewed, patient examined, no change in status, stable for surgery.  I have reviewed the patient's chart and labs.  Questions were answered to the patient's satisfaction.     Dominic Pea [name]

## 2021-09-12 NOTE — H&P (Signed)
GASTROENTEROLOGY PROCEDURE H&P NOTE   Primary Care Physician: [name], DO    Reason for Procedure:  FOBT positive stool, colon cancer screening  Plan:    Colonoscopy  Patient is appropriate for endoscopic procedure(s) in the ambulatory ([facility]) setting.  The nature of the procedure, as well as the risks, benefits, and alternatives were carefully and thoroughly reviewed with the patient. Ample time for discussion and questions allowed. The patient understood, was satisfied, and agreed to proceed.     HPI: Alfred Martin is a 55 y.o. male who presents for colonoscopy for evaluation of FOBT positive stool and colon cancer screening.  Due to history of CHF with EF 25-30%, procedure scheduled at [facility] due to elevated periprocedural risks.  Was last seen in the GI clinic 06/20/2021, and no significant changes in medical history in the interim.  Past Medical History:  Diagnosis Date   Abnormal echocardiogram 07/27/2015   Acute renal failure superimposed on stage 2 chronic kidney disease ([place]) 05/16/2021   Allergy    Cardiomyopathy ([place]) 07/27/2015   Overview:  Dilated.   CHF (congestive heart failure) (HCC)    Chronic HFrEF (heart failure with reduced ejection fraction) ([place]) 05/17/2021   Class 2 severe obesity with serious comorbidity and body mass index (BMI) of 35.0 to 35.9 in adult [facility]) 05/23/2018   DM2 (diabetes mellitus, type 2) ([place]) 05/17/2021   Dyspnea on exertion 06/20/2015   6 Minute Walk Test 06/20/15- 99% at rest, 97% at end of walk, 99% after 2 minutes rest. POatient walked 528 meters without stopping or incident. No oxgenation limitation on this test.   Gout    Gout of multiple sites 11/21/2017   Guaiac positive stools 04/03/2021   Hemorrhoids 04/03/2021   HTN (hypertension)    Hyperlipidemia    diet controlled and exercise   Hyperlipidemia LDL goal <100 08/12/2017   Joint pain    Joint swelling 08/25/2015   Obesity (BMI 30-39.9) 07/04/2021   Olecranon bursitis,  left elbow 07/29/2018   Preventative health care 06/01/2015   Radiculopathy 05/17/2021   Type 2 diabetes mellitus without complication, without long-term current use of insulin ([place]) 07/31/2021   Uncontrolled type 2 diabetes mellitus with hyperglycemia ([place]) 03/23/2020   Urinary frequency 04/03/2021    Past Surgical History:  Procedure Laterality Date   TONSILLECTOMY      Prior to Admission medications   Medication Sig Start Date End Date Taking? Authorizing Provider  allopurinol (ZYLOPRIM) 300 MG tablet TAKE 1 TABLET BY MOUTH EVERY DAY 06/14/21  Yes [name] R, DO  amLODipine-benazepril (LOTREL) 10-40 MG capsule Take 1 capsule by mouth in the morning.   Yes [provider]  fluticasone (FLONASE) 50 MCG/ACT nasal spray Place 2 sprays into both nostrils daily. 12/20/20  Yes [name] R, DO  hydrochlorothiazide (HYDRODIURIL) 25 MG tablet TAKE 1 TABLET BY MOUTH EVERY DAY 04/12/21  Yes [name] R, DO  hydroquinone 4 % cream Apply 1 application topically at bedtime.   Yes [provider]  levocetirizine (XYZAL) 5 MG tablet TAKE 1 TABLET BY MOUTH EVERY DAY IN THE EVENING 06/29/21  Yes [name] R, DO  metoprolol succinate (TOPROL-XL) 25 MG 24 hr tablet TAKE 1 TABLET BY MOUTH EVERY DAY 05/14/21  Yes Carollee Herter, Kendrick Fries R, DO  ACCU-CHEK GUIDE test strip USE AS DIRECTED UP TO 4 TIMES A DAY TO CHECK BLOOD SUGAR 08/07/21   [name], DO  Accu-Chek Softclix  Lancets lancets USE TO CHECK GLUCOSE THREE TIMES DAILY 06/08/21   Carollee Herter, [name], DO  methocarbamol (ROBAXIN) 500 MG tablet Take 1 tablet (500 mg total) by mouth every 6 (six) hours as needed for muscle spasms. Patient not taking: No sig reported 05/18/21   Shelly Coss, MD  rosuvastatin (CRESTOR) 40 MG tablet TAKE 1 TABLET BY MOUTH EVERY DAY Patient not taking: No sig reported 07/04/21   Carollee Herter, [name], DO  sacubitril-valsartan (ENTRESTO) 24-26 MG Take 1 tablet by mouth 2 (two)  times daily. Patient not taking: No sig reported 07/04/21   Tobb, Kardie, DO    Current Facility-Administered Medications  Medication Dose Route Frequency Provider Last Rate Last Admin   0.9 %  sodium chloride infusion   Intravenous Continuous Leonore Frankson V, DO       lactated ringers infusion   Intravenous Once Chun Sellen V, DO        Allergies as of 06/20/2021   (No Known Allergies)    Family History  Problem Relation Age of Onset   Arthritis Mother        Severe-- RA   Diabetes Mother    Hyperlipidemia Mother    High blood pressure Mother    Hypertension Mother    Obesity Mother    Colon cancer Paternal Uncle    Congestive Heart Failure Paternal Uncle    Stroke Paternal Uncle    Diabetes Father    Hyperlipidemia Father    High blood pressure Father    Hypertension Father    Liver disease Father    Diabetes Brother    Hypertension Brother    HIV/AIDS Brother    Breast cancer Maternal Aunt    Arthritis Maternal Aunt        RA   Hypertension Brother    Drug abuse Brother        chf-- crack / cocaine   Congestive Heart Failure Brother    Arthritis Maternal Aunt    Arthritis Maternal Aunt    Heart disease Maternal Grandmother    Breast cancer Paternal Grandmother    Colon polyps Neg Hx    Rectal cancer Neg Hx    Stomach cancer Neg Hx     Social History   Socioeconomic History   Marital status: Married    Spouse name: [name]   Number of children: 4   Years of education: Not on file   Highest education level: Not on file  Occupational History   Occupation: high school english teacer    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, prison and probation services  Tobacco Use   Smoking status: Never   Smokeless tobacco: Never  Vaping Use   Vaping Use: Never used  Substance and Sexual Activity   Alcohol use: No    Alcohol/week: 0.0 standard drinks   Drug use: No   Sexual activity: Yes  Other Topics Concern   Not on file  Social History Narrative   Exercise--  jogging 3-4 days a week   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   Social Determinants of Radio broadcast assistant Strain: Not on file  Food Insecurity: Not on file  Transportation Needs: Not on file  Physical Activity: Not on file  Stress: Not on file  Social Connections: Not on file  Intimate Partner Violence: Not on file    Physical Exam: Vital signs in last 24 hours: @BP  (!) 121/49   Pulse 83   Temp 97.8 F (36.6 C) (Oral)  Ht 5\' 9"  (1.753 m)   Wt 102.1 kg   SpO2 99%   BMI 33.23 kg/m  GEN: NAD EYE: Sclerae anicteric ENT: MMM CV: Non-tachycardic Pulm: CTA b/l GI: Soft, NT/ND NEURO:  Alert & Oriented x 3   Gerrit Heck, DO [facility]   09/12/2021 7:42 AM

## 2021-09-12 NOTE — Discharge Instructions (Signed)

## 2021-09-12 NOTE — Anesthesia Preprocedure Evaluation (Signed)
Anesthesia Evaluation  Patient identified by MRN, date of birth, ID band Patient awake    Reviewed: Allergy & Precautions, NPO status , Patient's Chart, lab work & pertinent test results  History of Anesthesia Complications Negative for: history of anesthetic complications  Airway Mallampati: II  TM Distance: >3 FB Neck ROM: Full    Dental  (+) Dental Advisory Given, Teeth Intact   Pulmonary neg pulmonary ROS,    breath sounds clear to auscultation       Cardiovascular hypertension, Pt. on medications and Pt. on home beta blockers +CHF   Rhythm:Regular  1. Left ventricular ejection fraction, by estimation, is 25 to 30%. The  left ventricle has severely decreased function. The left ventricle has no  regional wall motion abnormalities. The left ventricular internal cavity  size was moderately dilated. Left  ventricular diastolic parameters are indeterminate.  2. Right ventricular systolic function is normal. The right ventricular  size is normal. There is normal pulmonary artery systolic pressure.  3. The mitral valve is normal in structure. Mild mitral valve  regurgitation. No evidence of mitral stenosis.  4. The aortic valve is normal in structure. Aortic valve regurgitation is  not visualized. No aortic stenosis is present.  5. The inferior vena cava is normal in size with greater than 50%  respiratory variability, suggesting right atrial pressure of 3 mmHg.    Neuro/Psych negative neurological ROS  negative psych ROS   GI/Hepatic negative GI ROS, Neg liver ROS,   Endo/Other  diabetes  Renal/GU Renal InsufficiencyRenal diseaseLab Results      Component                Value               Date                      CREATININE               1.46 (H)            05/18/2021                Musculoskeletal negative musculoskeletal ROS (+)   Abdominal   Peds  Hematology negative hematology ROS (+)   Anesthesia  Other Findings   Reproductive/Obstetrics                             Anesthesia Physical Anesthesia Plan  ASA: 3  Anesthesia Plan: MAC   Post-op Pain Management:    Induction: Intravenous  PONV Risk Score and Plan: 1 and Propofol infusion and Treatment may vary due to age or medical condition  Airway Management Planned: Nasal Cannula  Additional Equipment: None  Intra-op Plan:   Post-operative Plan:   Informed Consent: I have reviewed the patients History and Physical, chart, labs and discussed the procedure including the risks, benefits and alternatives for the proposed anesthesia with the patient or authorized representative who has indicated his/her understanding and acceptance.     Dental advisory given  Plan Discussed with: CRNA and Anesthesiologist  Anesthesia Plan Comments:         Anesthesia Quick Evaluation

## 2021-09-12 NOTE — Anesthesia Procedure Notes (Addendum)
Procedure Name: MAC Date/Time: 09/12/2021 8:45 AM Performed by: Lissa Morales, CRNA Pre-anesthesia Checklist: Patient identified, Emergency Drugs available, Suction available and Patient being monitored Patient Re-evaluated:Patient Re-evaluated prior to induction Oxygen Delivery Method: Simple face mask Placement Confirmation: positive ETCO2

## 2021-09-12 NOTE — Transfer of Care (Signed)
Immediate Anesthesia Transfer of Care Note  Patient: Alfred Martin  Procedure(s) Performed: COLONOSCOPY WITH PROPOFOL POLYPECTOMY SUBMUCOSAL TATTOO INJECTION  Patient Location: PACU  Anesthesia Type:MAC  Level of Consciousness: awake, alert , oriented and patient cooperative  Airway & Oxygen Therapy: Patient Spontanous Breathing and Patient connected to face mask oxygen  Post-op Assessment: Report given to RN, Post -op Vital signs reviewed and stable and Patient moving all extremities X 4  Post vital signs: stable  Last Vitals:  Vitals Value Taken Time  BP 111/62 09/12/21 0941  Temp    Pulse 79 09/12/21 0943  Resp 22 09/12/21 0943  SpO2 99 % 09/12/21 0943  Vitals shown include unvalidated device data.  Last Pain:  Vitals:   09/12/21 0737  TempSrc: Oral  PainSc: 0-No pain         Complications: No notable events documented.

## 2021-09-13 ENCOUNTER — Encounter (HOSPITAL_COMMUNITY): Payer: Self-pay | Admitting: Gastroenterology

## 2021-09-14 ENCOUNTER — Other Ambulatory Visit: Payer: Self-pay

## 2021-09-14 DIAGNOSIS — C189 Malignant neoplasm of colon, unspecified: Secondary | ICD-10-CM

## 2021-09-14 LAB — SURGICAL PATHOLOGY

## 2021-09-18 ENCOUNTER — Other Ambulatory Visit: Payer: Self-pay

## 2021-09-18 ENCOUNTER — Other Ambulatory Visit (INDEPENDENT_AMBULATORY_CARE_PROVIDER_SITE_OTHER): Payer: BC Managed Care – PPO

## 2021-09-18 DIAGNOSIS — C189 Malignant neoplasm of colon, unspecified: Secondary | ICD-10-CM

## 2021-09-18 NOTE — Anesthesia Postprocedure Evaluation (Signed)
Anesthesia Post Note  Patient: [name]  Procedure(s) Performed: COLONOSCOPY WITH PROPOFOL POLYPECTOMY SUBMUCOSAL TATTOO INJECTION     Patient location during evaluation: Endoscopy Anesthesia Type: MAC Level of consciousness: awake and alert Pain management: pain level controlled Vital Signs Assessment: post-procedure vital signs reviewed and stable Respiratory status: spontaneous breathing, nonlabored ventilation, respiratory function stable and patient connected to nasal cannula oxygen Cardiovascular status: stable and blood pressure returned to baseline Postop Assessment: no apparent nausea or vomiting Anesthetic complications: no   No notable events documented.  Last Vitals:  Vitals:   09/12/21 0950 09/12/21 1000  BP: 117/76 111/73  Pulse: 72 67  Resp: 16 12  Temp:    SpO2: 96% 97%    Last Pain:  Vitals:   09/12/21 1000  TempSrc:   PainSc: 0-No pain                 Amandajo Gonder

## 2021-09-19 LAB — CEA: CEA: 0.6 ng/mL

## 2021-09-20 ENCOUNTER — Other Ambulatory Visit: Payer: Self-pay | Admitting: Family Medicine

## 2021-09-20 DIAGNOSIS — E559 Vitamin D deficiency, unspecified: Secondary | ICD-10-CM

## 2021-09-22 ENCOUNTER — Other Ambulatory Visit: Payer: Self-pay | Admitting: Family Medicine

## 2021-09-29 ENCOUNTER — Other Ambulatory Visit: Payer: Self-pay | Admitting: Family Medicine

## 2021-09-29 DIAGNOSIS — E785 Hyperlipidemia, unspecified: Secondary | ICD-10-CM

## 2021-09-29 DIAGNOSIS — E1169 Type 2 diabetes mellitus with other specified complication: Secondary | ICD-10-CM

## 2021-09-29 DIAGNOSIS — J0140 Acute pansinusitis, unspecified: Secondary | ICD-10-CM

## 2021-10-04 ENCOUNTER — Other Ambulatory Visit: Payer: Self-pay

## 2021-10-04 ENCOUNTER — Encounter: Payer: Self-pay | Admitting: Family Medicine

## 2021-10-04 ENCOUNTER — Ambulatory Visit: Payer: BC Managed Care – PPO | Admitting: Family Medicine

## 2021-10-04 VITALS — BP 126/70 | HR 58 | Temp 98.1°F | Resp 18 | Ht 69.0 in | Wt 229.6 lb

## 2021-10-04 DIAGNOSIS — E1169 Type 2 diabetes mellitus with other specified complication: Secondary | ICD-10-CM

## 2021-10-04 DIAGNOSIS — I10 Essential (primary) hypertension: Secondary | ICD-10-CM

## 2021-10-04 DIAGNOSIS — E1165 Type 2 diabetes mellitus with hyperglycemia: Secondary | ICD-10-CM | POA: Diagnosis not present

## 2021-10-04 DIAGNOSIS — E785 Hyperlipidemia, unspecified: Secondary | ICD-10-CM

## 2021-10-04 LAB — COMPREHENSIVE METABOLIC PANEL
ALT: 27 U/L (ref 0–53)
AST: 29 U/L (ref 0–37)
Albumin: 4.5 g/dL (ref 3.5–5.2)
Alkaline Phosphatase: 38 U/L — ABNORMAL LOW (ref 39–117)
BUN: 23 mg/dL (ref 6–23)
CO2: 28 mEq/L (ref 19–32)
Calcium: 9.8 mg/dL (ref 8.4–10.5)
Chloride: 103 mEq/L (ref 96–112)
Creatinine, Ser: 1.35 mg/dL (ref 0.40–1.50)
GFR: 58.61 mL/min — ABNORMAL LOW (ref >60.00)
Glucose, Bld: 97 mg/dL (ref 70–99)
Potassium: 3.9 mEq/L (ref 3.5–5.1)
Sodium: 138 mEq/L (ref 135–145)
Total Bilirubin: 0.9 mg/dL (ref 0.2–1.2)
Total Protein: 7.3 g/dL (ref 6.0–8.3)

## 2021-10-04 LAB — MICROALBUMIN / CREATININE URINE RATIO
Creatinine,U: 143.6 mg/dL
Microalb Creat Ratio: 0.6 mg/g (ref 0.0–30.0)
Microalb, Ur: 0.8 mg/dL (ref 0.0–1.9)

## 2021-10-04 LAB — LIPID PANEL
Cholesterol: 184 mg/dL (ref 0–200)
HDL: 56.6 mg/dL (ref >39.00)
LDL Cholesterol: 113 mg/dL — ABNORMAL HIGH (ref 0–99)
NonHDL: 127.67
Total CHOL/HDL Ratio: 3
Triglycerides: 72 mg/dL (ref 0.0–149.0)
VLDL: 14.4 mg/dL (ref 0.0–40.0)

## 2021-10-04 LAB — HEMOGLOBIN A1C: Hgb A1c MFr Bld: 6.3 % (ref 4.6–6.5)

## 2021-10-04 NOTE — Assessment & Plan Note (Signed)
Per endo °

## 2021-10-04 NOTE — Progress Notes (Addendum)
Subjective:   By signing my name below, I, Alfred Martin, attest that this documentation has been prepared under the direction and in the presence of Dr. [name], DO. 10/04/2021    Patient ID: Alfred Martin, male    DOB: 1966/03/12, 55 y.o.   MRN: [id number]  Chief Complaint  Patient presents with   Hypertension   Hyperlipidemia   Follow-up    Hypertension  Hyperlipidemia  Patient is in today for a office visit.   Hypertension- His blood pressure is slightly elevated during this visit. She continues taking 10-40 mg Lotrel daily PO, 25 mg metoprolol succinate daily PO, 25 mg hydrochlorothiazide daily PO. He does not take entresto daily PO due to feeling fatigued while taking it. He no longer sees his cardiology. He refuses to see a heart failure specialist. He does not want to change his current medication. He is also limiting his salt intake in his diet. He occasionally checks his blood pressure. He denies having any dizziness or leg swelling.   BP Readings from Last 3 Encounters:  10/04/21 126/70  09/12/21 111/73  07/31/21 138/90   Pulse Readings from Last 3 Encounters:  10/04/21 (!) 58  09/12/21 67  07/31/21 77   Blood sugar- He is not taking any medication and is controlling his diabetes through diet.  Vision- He reports seeing a vision specialist and reports his vision improved since he changed his diet.  Lab Results  Component Value Date   HGBA1C 6.0 (A) 07/31/2021   Immunizations- He is not interested in receiving the shingles vaccines. He is not interested in receiving the Covid-19 vaccines and does not have any at this time. He is not interested in receiving the flu vaccine.    Health Maintenance Due  Topic Date Due   FOOT EXAM  Never done    Past Medical History:  Diagnosis Date   Abnormal echocardiogram 07/27/2015   Acute renal failure superimposed on stage 2 chronic kidney disease ([place]) 05/16/2021   Allergy    Cardiomyopathy ([place]) 07/27/2015    Overview:  Dilated.   CHF (congestive heart failure) (HCC)    Chronic HFrEF (heart failure with reduced ejection fraction) ([place]) 05/17/2021   Class 2 severe obesity with serious comorbidity and body mass index (BMI) of 35.0 to 35.9 in adult [facility]) 05/23/2018   DM2 (diabetes mellitus, type 2) ([place]) 05/17/2021   Dyspnea on exertion 06/20/2015   6 Minute Walk Test 06/20/15- 99% at rest, 97% at end of walk, 99% after 2 minutes rest. POatient walked 528 meters without stopping or incident. No oxgenation limitation on this test.   Gout    Gout of multiple sites 11/21/2017   Guaiac positive stools 04/03/2021   Hemorrhoids 04/03/2021   HTN (hypertension)    Hyperlipidemia    diet controlled and exercise   Hyperlipidemia LDL goal <100 08/12/2017   Joint pain    Joint swelling 08/25/2015   Obesity (BMI 30-39.9) 07/04/2021   Olecranon bursitis, left elbow 07/29/2018   Preventative health care 06/01/2015   Radiculopathy 05/17/2021   Type 2 diabetes mellitus without complication, without long-term current use of insulin ([place]) 07/31/2021   Uncontrolled type 2 diabetes mellitus with hyperglycemia ([place]) 03/23/2020   Urinary frequency 04/03/2021    Past Surgical History:  Procedure Laterality Date   COLONOSCOPY WITH PROPOFOL N/A 09/12/2021   Procedure: COLONOSCOPY WITH PROPOFOL;  Surgeon: Lavena Bullion, DO;  Location: [facility];  Service: Gastroenterology;  Laterality: N/A;   POLYPECTOMY  09/12/2021  Procedure: POLYPECTOMY;  Surgeon: Lavena Bullion, DO;  Location: [facility];  Service: Gastroenterology;;   SUBMUCOSAL TATTOO INJECTION  09/12/2021   Procedure: SUBMUCOSAL TATTOO INJECTION;  Surgeon: Lavena Bullion, DO;  Location: [facility];  Service: Gastroenterology;;   TONSILLECTOMY      Family History  Problem Relation Age of Onset   Arthritis Mother        Severe-- RA   Diabetes Mother    Hyperlipidemia Mother    High blood pressure Mother    Hypertension Mother    Obesity Mother     Colon cancer Paternal Uncle    Congestive Heart Failure Paternal Uncle    Stroke Paternal Uncle    Diabetes Father    Hyperlipidemia Father    High blood pressure Father    Hypertension Father    Liver disease Father    Diabetes Brother    Hypertension Brother    HIV/AIDS Brother    Breast cancer Maternal Aunt    Arthritis Maternal Aunt        RA   Hypertension Brother    Drug abuse Brother        chf-- crack / cocaine   Congestive Heart Failure Brother    Arthritis Maternal Aunt    Arthritis Maternal Aunt    Heart disease Maternal Grandmother    Breast cancer Paternal Grandmother    Colon polyps Neg Hx    Rectal cancer Neg Hx    Stomach cancer Neg Hx     Social History   Socioeconomic History   Marital status: Married    Spouse name: Alfred Martin   Number of children: 4   Years of education: Not on file   Highest education level: Not on file  Occupational History   Occupation: high school english teacer    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, prison and probation services  Tobacco Use   Smoking status: Never   Smokeless tobacco: Never  Vaping Use   Vaping Use: Never used  Substance and Sexual Activity   Alcohol use: No    Alcohol/week: 0.0 standard drinks   Drug use: No   Sexual activity: Yes  Other Topics Concern   Not on file  Social History Narrative   Exercise-- jogging 3-4 days a week   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   Social Determinants of Radio broadcast assistant Strain: Not on file  Food Insecurity: Not on file  Transportation Needs: Not on file  Physical Activity: Not on file  Stress: Not on file  Social Connections: Not on file  Intimate Partner Violence: Not on file    Outpatient Medications Prior to Visit  Medication Sig Dispense Refill   ACCU-CHEK GUIDE test strip USE AS DIRECTED UP TO 4 TIMES A DAY TO CHECK BLOOD SUGAR 100 strip 1   Accu-Chek Softclix Lancets lancets USE TO CHECK GLUCOSE THREE TIMES DAILY 100 each 1   allopurinol (ZYLOPRIM) 300 MG  tablet TAKE 1 TABLET BY MOUTH EVERY DAY 90 tablet 2   amLODipine-benazepril (LOTREL) 10-40 MG capsule Take 1 capsule by mouth in the morning.     fluticasone (FLONASE) 50 MCG/ACT nasal spray Place 2 sprays into both nostrils daily. 16 g 6   hydrochlorothiazide (HYDRODIURIL) 25 MG tablet TAKE 1 TABLET BY MOUTH EVERY DAY 90 tablet 1   hydroquinone 4 % cream Apply 1 application topically at bedtime.     levocetirizine (XYZAL) 5 MG tablet TAKE 1 TABLET BY MOUTH EVERY DAY IN THE  EVENING 90 tablet 1   methocarbamol (ROBAXIN) 500 MG tablet Take 1 tablet (500 mg total) by mouth every 6 (six) hours as needed for muscle spasms. 30 tablet 0   metoprolol succinate (TOPROL-XL) 25 MG 24 hr tablet TAKE 1 TABLET BY MOUTH EVERY DAY 90 tablet 1   rosuvastatin (CRESTOR) 40 MG tablet TAKE 1 TABLET BY MOUTH EVERY DAY 90 tablet 1   sacubitril-valsartan (ENTRESTO) 24-26 MG Take 1 tablet by mouth 2 (two) times daily. (Patient not taking: No sig reported) 180 tablet 3   No facility-administered medications prior to visit.    No Known Allergies  Review of Systems  Cardiovascular:  Negative for leg swelling.  Neurological:  Negative for dizziness.      Objective:    Physical Exam Constitutional:      General: He is not in acute distress.    Appearance: Normal appearance. He is not ill-appearing.  HENT:     Head: Normocephalic and atraumatic.     Right Ear: External ear normal.     Left Ear: External ear normal.  Eyes:     Extraocular Movements: Extraocular movements intact.     Pupils: Pupils are equal, round, and reactive to light.  Cardiovascular:     Rate and Rhythm: Normal rate and regular rhythm.     Heart sounds: Normal heart sounds. No murmur heard.   No gallop.  Pulmonary:     Effort: Pulmonary effort is normal. No respiratory distress.     Breath sounds: Normal breath sounds. No wheezing or rales.  Skin:    General: Skin is warm and dry.  Neurological:     Mental Status: He is alert and  oriented to person, place, and time.  Psychiatric:        Behavior: Behavior normal.    BP 126/70 (BP Location: Right Arm, Cuff Size: Normal)   Pulse (!) 58   Temp 98.1 F (36.7 C) (Oral)   Resp 18   Ht 5\' 9"  (1.753 m)   Wt 229 lb 9.6 oz (104.1 kg)   SpO2 99%   BMI 33.91 kg/m  Wt Readings from Last 3 Encounters:  10/04/21 229 lb 9.6 oz (104.1 kg)  09/12/21 225 lb (102.1 kg)  07/31/21 227 lb 6.4 oz (103.1 kg)       Assessment & Plan:   Problem List Items Addressed This Visit       Unprioritized   DM2 (diabetes mellitus, type 2) ([place]) - Primary (Chronic)   Relevant Orders   Hemoglobin A1c   Comprehensive metabolic panel   HTN (hypertension) (Chronic)    Well controlled, no changes to meds. Encouraged heart healthy diet such as the DASH diet and exercise as tolerated.       Relevant Orders   Hemoglobin A1c   Comprehensive metabolic panel   Lipid panel   Microalbumin / creatinine urine ratio   Hyperlipidemia    Encourage heart healthy diet such as MIND or DASH diet, increase exercise, avoid trans fats, simple carbohydrates and processed foods, consider a krill or fish or flaxseed oil cap daily.       Uncontrolled type 2 diabetes mellitus with hyperglycemia ([place])    Per endo      Other Visit Diagnoses     Hyperlipidemia associated with type 2 diabetes mellitus ([place])       Relevant Orders   Hemoglobin A1c   Comprehensive metabolic panel   Lipid panel   Microalbumin / creatinine urine ratio  No orders of the defined types were placed in this encounter.   I, Dr. [name], DO, personally preformed the services described in this documentation.  All medical record entries made by the scribe were at my direction and in my presence.  I have reviewed the chart and discharge instructions (if applicable) and agree that the record reflects my personal performance and is accurate and complete. 10/04/2021   I,Alfred Martin,acting as a scribe for  [name], DO.,have documented all relevant documentation on the behalf of [name], DO,as directed by  [name], DO while in the presence of [name], DO.   [name], DO

## 2021-10-04 NOTE — Patient Instructions (Signed)

## 2021-10-04 NOTE — Assessment & Plan Note (Signed)
Encourage heart healthy diet such as MIND or DASH diet, increase exercise, avoid trans fats, simple carbohydrates and processed foods, consider a krill or fish or flaxseed oil cap daily.  °

## 2021-10-04 NOTE — Assessment & Plan Note (Signed)
Well controlled, no changes to meds. Encouraged heart healthy diet such as the DASH diet and exercise as tolerated.  °

## 2021-10-08 ENCOUNTER — Other Ambulatory Visit: Payer: Self-pay | Admitting: Family Medicine

## 2021-10-08 DIAGNOSIS — I10 Essential (primary) hypertension: Secondary | ICD-10-CM

## 2021-10-08 DIAGNOSIS — E119 Type 2 diabetes mellitus without complications: Secondary | ICD-10-CM

## 2021-10-28 ENCOUNTER — Other Ambulatory Visit: Payer: Self-pay | Admitting: Family Medicine

## 2021-10-28 DIAGNOSIS — J0140 Acute pansinusitis, unspecified: Secondary | ICD-10-CM

## 2021-10-29 ENCOUNTER — Other Ambulatory Visit: Payer: Self-pay | Admitting: Family Medicine

## 2021-11-05 ENCOUNTER — Other Ambulatory Visit: Payer: Self-pay | Admitting: Family Medicine

## 2021-11-18 ENCOUNTER — Other Ambulatory Visit: Payer: Self-pay | Admitting: Family Medicine

## 2021-12-19 ENCOUNTER — Encounter: Payer: Self-pay | Admitting: Gastroenterology

## 2021-12-30 ENCOUNTER — Encounter: Payer: Self-pay | Admitting: Emergency Medicine

## 2021-12-30 ENCOUNTER — Telehealth: Payer: BC Managed Care – PPO | Admitting: Emergency Medicine

## 2021-12-30 DIAGNOSIS — J069 Acute upper respiratory infection, unspecified: Secondary | ICD-10-CM | POA: Diagnosis not present

## 2021-12-30 MED ORDER — BENZONATATE 100 MG PO CAPS: 100.0000 mg | ORAL_CAPSULE | Freq: Two times a day (BID) | ORAL | 0 refills | Status: DC | PRN

## 2021-12-30 MED ORDER — AZELASTINE HCL 0.1 % NA SOLN: 1.0000 | Freq: Two times a day (BID) | NASAL | 12 refills | Status: AC

## 2021-12-30 NOTE — Progress Notes (Signed)
I have spent 5 minutes in review of e-visit questionnaire, review and updating patient chart, medical decision making and response to patient.   Anaysha Andre, PA-C    

## 2021-12-30 NOTE — Progress Notes (Signed)

## 2022-01-11 ENCOUNTER — Other Ambulatory Visit: Payer: Self-pay | Admitting: Family Medicine

## 2022-01-26 ENCOUNTER — Other Ambulatory Visit: Payer: Self-pay | Admitting: Family Medicine

## 2022-01-26 DIAGNOSIS — L819 Disorder of pigmentation, unspecified: Secondary | ICD-10-CM

## 2022-01-29 ENCOUNTER — Encounter: Payer: Self-pay | Admitting: Internal Medicine

## 2022-01-29 ENCOUNTER — Ambulatory Visit: Payer: BC Managed Care – PPO | Admitting: Internal Medicine

## 2022-01-29 VITALS — BP 122/84 | HR 88 | Ht 69.0 in | Wt 235.6 lb

## 2022-01-29 DIAGNOSIS — E119 Type 2 diabetes mellitus without complications: Secondary | ICD-10-CM | POA: Diagnosis not present

## 2022-01-29 DIAGNOSIS — E1169 Type 2 diabetes mellitus with other specified complication: Secondary | ICD-10-CM

## 2022-01-29 DIAGNOSIS — E785 Hyperlipidemia, unspecified: Secondary | ICD-10-CM | POA: Diagnosis not present

## 2022-01-29 DIAGNOSIS — E1165 Type 2 diabetes mellitus with hyperglycemia: Secondary | ICD-10-CM

## 2022-01-29 LAB — POCT GLYCOSYLATED HEMOGLOBIN (HGB A1C): Hemoglobin A1C: 5.9 % — AB (ref 4.0–5.6)

## 2022-01-29 NOTE — Progress Notes (Signed)
Name: Alfred Martin  Age/ Sex: 56 y.o., male   MRN/ DOB: [id number], 07-21-1966     PCP: [name], DO   Reason for Endocrinology Evaluation: Type 2 Diabetes Mellitus  Initial Endocrine Consultative Visit: 05/01/2021    PATIENT IDENTIFIER: Alfred Martin is a 56 y.o. male with a past medical history of T2DM, HTN, Cardiomyopathy and CHF. The patient has followed with Endocrinology clinic since 05/01/2021 for consultative assistance with management of his diabetes.  DIABETIC HISTORY:  Alfred Martin was diagnosed with DM in 2020, he was prescribed Rybelsus and Tresiba in 04/2021 but did not start due to severe blurry vision. His hemoglobin A1c has ranged from 6.5% in 2020, peaking at 13.8% in 2022.   On his initial visit his A1c was 13.8%. He was not taking Rybelsus due to blurry vision, he was also not taking [place]. His BG's have been in the low 100's after he made major lifestyle changes and we opted to monitor.   Repeat A1c 6.0% 07/2021    SUBJECTIVE:   During the last visit (07/31/2021): A1c 6.0% , no medications offered       Today (01/29/2022): Alfred Martin is here for a follow up on diabetes management.  He checks his blood sugars daily  The patient has not had hypoglycemic episodes since the last clinic visit  Denies nausea, vomiting or diarrhea  Denies vision changes     HOME DIABETES REGIMEN:  N/A     Statin: yes ACE-I/ARB: yes Prior Diabetic Education: no   METER DOWNLOAD SUMMARY: Unable to download  81- 140 mg/dL    DIABETIC COMPLICATIONS: Microvascular complications:   Denies: CKD, retinopathy, neuropathy Last Eye Exam: Completed 05/2021  Macrovascular complications:   Denies: CAD, CVA, PVD   HISTORY:  Past Medical History:  Past Medical History:  Diagnosis Date   Abnormal echocardiogram 07/27/2015   Acute renal failure superimposed on stage 2 chronic kidney disease ([place]) 05/16/2021   Allergy    Cardiomyopathy ([place]) 07/27/2015   Overview:   Dilated.   CHF (congestive heart failure) (HCC)    Chronic HFrEF (heart failure with reduced ejection fraction) ([place]) 05/17/2021   Class 2 severe obesity with serious comorbidity and body mass index (BMI) of 35.0 to 35.9 in adult [facility]) 05/23/2018   DM2 (diabetes mellitus, type 2) ([place]) 05/17/2021   Dyspnea on exertion 06/20/2015   6 Minute Walk Test 06/20/15- 99% at rest, 97% at end of walk, 99% after 2 minutes rest. POatient walked 528 meters without stopping or incident. No oxgenation limitation on this test.   Gout    Gout of multiple sites 11/21/2017   Guaiac positive stools 04/03/2021   Hemorrhoids 04/03/2021   HTN (hypertension)    Hyperlipidemia    diet controlled and exercise   Hyperlipidemia LDL goal <100 08/12/2017   Joint pain    Joint swelling 08/25/2015   Obesity (BMI 30-39.9) 07/04/2021   Olecranon bursitis, left elbow 07/29/2018   Preventative health care 06/01/2015   Radiculopathy 05/17/2021   Type 2 diabetes mellitus without complication, without long-term current use of insulin ([place]) 07/31/2021   Uncontrolled type 2 diabetes mellitus with hyperglycemia ([place]) 03/23/2020   Urinary frequency 04/03/2021   Past Surgical History:  Past Surgical History:  Procedure Laterality Date   COLONOSCOPY WITH PROPOFOL N/A 09/12/2021   Procedure: COLONOSCOPY WITH PROPOFOL;  Surgeon: Lavena Bullion, DO;  [facility];  Service: Gastroenterology;  Laterality: N/A;   POLYPECTOMY  09/12/2021   Procedure:  POLYPECTOMY;  Surgeon: Lavena Bullion, DO;  Location: [facility];  Service: Gastroenterology;;   SUBMUCOSAL TATTOO INJECTION  09/12/2021   Procedure: SUBMUCOSAL TATTOO INJECTION;  Surgeon: Lavena Bullion, DO;  [facility];  Service: Gastroenterology;;   TONSILLECTOMY     Social History:  reports that he has never smoked. He has never used smokeless tobacco. He reports that he does not drink alcohol and does not use drugs. Family History:  Family History  Problem  Relation Age of Onset   Arthritis Mother        Severe-- RA   Diabetes Mother    Hyperlipidemia Mother    High blood pressure Mother    Hypertension Mother    Obesity Mother    Colon cancer Paternal Uncle    Congestive Heart Failure Paternal Uncle    Stroke Paternal Uncle    Diabetes Father    Hyperlipidemia Father    High blood pressure Father    Hypertension Father    Liver disease Father    Diabetes Brother    Hypertension Brother    HIV/AIDS Brother    Breast cancer Maternal Aunt    Arthritis Maternal Aunt        RA   Hypertension Brother    Drug abuse Brother        chf-- crack / cocaine   Congestive Heart Failure Brother    Arthritis Maternal Aunt    Arthritis Maternal Aunt    Heart disease Maternal Grandmother    Breast cancer Paternal Grandmother    Colon polyps Neg Hx    Rectal cancer Neg Hx    Stomach cancer Neg Hx      HOME MEDICATIONS: Allergies as of 01/29/2022   No Known Allergies      Medication List        Accurate as of January 29, 2022  8:56 AM. If you have any questions, ask your nurse or doctor.          Accu-Chek Guide test strip Generic drug: glucose blood USE AS DIRECTED UP TO 4 TIMES A DAY TO CHECK BLOOD SUGAR   Accu-Chek Softclix Lancets lancets USE TO CHECK GLUCOSE THREE TIMES DAILY   allopurinol 300 MG tablet Commonly known as: ZYLOPRIM TAKE 1 TABLET BY MOUTH EVERY DAY   amLODipine-benazepril 10-40 MG capsule Commonly known as: LOTREL TAKE 1 CAPSULE BY MOUTH EVERY DAY   azelastine 0.1 % nasal spray Commonly known as: ASTELIN Place 1 spray into both nostrils 2 (two) times daily. Use in each nostril as directed   benzonatate 100 MG capsule Commonly known as: TESSALON Take 1 capsule (100 mg total) by mouth 2 (two) times daily as needed for cough.   Entresto 24-26 MG Generic drug: sacubitril-valsartan Take 1 tablet by mouth 2 (two) times daily.   fluticasone 50 MCG/ACT nasal spray Commonly known as: FLONASE SPRAY  2 SPRAYS INTO EACH NOSTRIL EVERY DAY   hydrochlorothiazide 25 MG tablet Commonly known as: HYDRODIURIL TAKE 1 TABLET BY MOUTH EVERY DAY   hydroquinone 4 % cream APPLY TO AFFECTED AREA TWICE A DAY   levocetirizine 5 MG tablet Commonly known as: XYZAL TAKE 1 TABLET BY MOUTH EVERY DAY IN THE EVENING   methocarbamol 500 MG tablet Commonly known as: ROBAXIN Take 1 tablet (500 mg total) by mouth every 6 (six) hours as needed for muscle spasms.   metoprolol succinate 25 MG 24 hr tablet Commonly known as: TOPROL-XL TAKE 1 TABLET BY MOUTH EVERY DAY  rosuvastatin 40 MG tablet Commonly known as: CRESTOR TAKE 1 TABLET BY MOUTH EVERY DAY         OBJECTIVE:   Vital Signs: BP 122/84 (BP Location: Left Arm, Patient Position: Sitting, Cuff Size: Large)    Pulse 88    Ht 5\' 9"  (1.753 m)    Wt 235 lb 9.6 oz (106.9 kg)    SpO2 96%    BMI 34.79 kg/m   Wt Readings from Last 3 Encounters:  01/29/22 235 lb 9.6 oz (106.9 kg)  10/04/21 229 lb 9.6 oz (104.1 kg)  09/12/21 225 lb (102.1 kg)     Exam: General: Pt appears well and is in NAD  Neck: General: Supple without adenopathy. Thyroid: Thyroid size normal.  No goiter or nodules appreciated.   Lungs: Clear with good BS bilat with no rales, rhonchi, or wheezes  Heart: RRR with normal S1 and S2 and no gallops; no murmurs; no rub  Abdomen: Normoactive bowel sounds, soft, nontender, without masses or organomegaly palpable  Extremities: No pretibial edema.   Neuro: MS is good with appropriate affect, pt is alert and Ox3    DM foot exam: 01/29/2022   The skin of the feet is intact without sores or ulcerations. The pedal pulses are 2+ on right and 2+ on left. The sensation is intact to a screening 5.07, 10 gram monofilament bilaterally    DATA REVIEWED:  Lab Results  Component Value Date   HGBA1C 5.9 (A) 01/29/2022   HGBA1C 6.3 10/04/2021   HGBA1C 6.0 (A) 07/31/2021   Lab Results  Component Value Date   MICROALBUR 0.8 10/04/2021    LDLCALC 113 (H) 10/04/2021   CREATININE 1.35 10/04/2021   Results for LONN, IM (MRN [id number]) as of 07/31/2021 08:30  Ref. Range 05/18/2021 05:25  Sodium Latest Ref Range: 135 - 145 mmol/L 138  Potassium Latest Ref Range: 3.5 - 5.1 mmol/L 3.4 (L)  Chloride Latest Ref Range: 98 - 111 mmol/L 105  CO2 Latest Ref Range: 22 - 32 mmol/L 28  Glucose Latest Ref Range: 70 - 99 mg/dL 97  BUN Latest Ref Range: 6 - 20 mg/dL 17  Creatinine Latest Ref Range: 0.61 - 1.24 mg/dL 1.46 (H)  Calcium Latest Ref Range: 8.9 - 10.3 mg/dL 9.0  Anion gap Latest Ref Range: 5 - 15  5  GFR, Estimated Latest Ref Range: >60 mL/min 56 (L)  Total CHOL/HDL Ratio Latest Units: RATIO 3.3  Cholesterol Latest Ref Range: 0 - 200 mg/dL 146  HDL Cholesterol Latest Ref Range: >40 mg/dL 44  LDL (calc) Latest Ref Range: 0 - 99 mg/dL 84  Triglycerides Latest Ref Range: <150 mg/dL 88  VLDL Latest Ref Range: 0 - 40 mg/dL 18    ASSESSMENT / PLAN / RECOMMENDATIONS:   1) Type 2 Diabetes Mellitus, Optimally controlled, Without complications - Most recent A1c of 5.9 %. Goal A1c < 7.0 %.     - I have praised the pt on improved glycemic control with just lifestyle changes - No changes    MEDICATIONS: N/A  EDUCATION / INSTRUCTIONS: BG monitoring instructions: Patient is instructed to check his blood sugars 2-3 times a week Call [facility] if: BG persistently < 70  I reviewed the Rule of 15 for the treatment of hypoglycemia in detail with the patient. Literature supplied.     2) Diabetic complications:  Eye: Does not have known diabetic retinopathy.  Neuro/ Feet: Does not have known diabetic peripheral neuropathy .  Renal: Patient  does not have known baseline CKD. He   is on an ACEI/ARB at present.   3) Dyslipidemia :    - LDL above goal. He is not taking his rosuvastatin . Discussed cardiovascular benefits or statins. We also discussed arthralgias and myalgias a side effect     F/U in 6  months     Signed electronically by: Mack Guise, MD  [facility]  [address] [place], [place] [address] Phone: [phone] FAX: [phone]   CC: [name], DO [address] [place] [address] Phone: [phone]  Fax: [phone]  Return to Endocrinology clinic as below: Future Appointments  Date Time Provider [place]  04/04/2022  8:20 AM [name], DO [id number]

## 2022-02-25 ENCOUNTER — Telehealth: Payer: BC Managed Care – PPO | Admitting: Physician Assistant

## 2022-02-25 ENCOUNTER — Other Ambulatory Visit: Payer: Self-pay | Admitting: Family Medicine

## 2022-02-25 DIAGNOSIS — J0140 Acute pansinusitis, unspecified: Secondary | ICD-10-CM

## 2022-02-25 DIAGNOSIS — I10 Essential (primary) hypertension: Secondary | ICD-10-CM

## 2022-02-25 DIAGNOSIS — M4802 Spinal stenosis, cervical region: Secondary | ICD-10-CM

## 2022-02-25 MED ORDER — METHYLPREDNISOLONE 4 MG PO TBPK: ORAL_TABLET | ORAL | 0 refills | Status: DC

## 2022-02-25 MED ORDER — METHOCARBAMOL 500 MG PO TABS: 500.0000 mg | ORAL_TABLET | Freq: Three times a day (TID) | ORAL | 0 refills | Status: DC | PRN

## 2022-02-25 NOTE — Progress Notes (Signed)
We are sorry that you are not feeling well.  Here is how we plan to help! ? ?Based on what you have shared with me it looks like you mostly have acute back pain. ? ?Acute back pain is defined as musculoskeletal pain that can resolve in 1-3 weeks with conservative treatment. ? ?I have prescribed Merdrol dose pack. This is a steroid taper pack, as well as Methocarbamol '500mg'$  Take 1 tablet every 8 hours as needed for muscle spasm.  Some patients experience stomach irritation or in increased heartburn with anti-inflammatory drugs.  Please keep in mind that muscle relaxer's can cause fatigue and should not be taken while at work or driving.  Back pain is very common.  The pain often gets better over time.  The cause of back pain is usually not dangerous.  Most people can learn to manage their back pain on their own. ? ?Home Care ?Stay active.  Start with short walks on flat ground if you can.  Try to walk farther each day. ?Do not sit, drive or stand in one place for more than 30 minutes.  Do not stay in bed. ?Do not avoid exercise or work.  Activity can help your back heal faster. ?Be careful when you bend or lift an object.  Bend at your knees, keep the object close to you, and do not twist. ?Sleep on a firm mattress.  Lie on your side, and bend your knees.  If you lie on your back, put a pillow under your knees. ?Only take medicines as told by your doctor. ?Put ice on the injured area. ?Put ice in a plastic bag ?Place a towel between your skin and the bag ?Leave the ice on for 15-20 minutes, 3-4 times a day for the first 2-3 days. 210 After that, you can switch between ice and heat packs. ?Ask your doctor about back exercises or massage. ?Avoid feeling anxious or stressed.  Find good ways to deal with stress, such as exercise. ? ?Get Help Right Way If: ?Your pain does not go away with rest or medicine. ?Your pain does not go away in 1 week. ?You have new problems. ?You do not feel well. ?The pain spreads into your  legs. ?You cannot control when you poop (bowel movement) or pee (urinate) ?You feel sick to your stomach (nauseous) or throw up (vomit) ?You have belly (abdominal) pain. ?You feel like you may pass out (faint). ?If you develop a fever. ? ?Make Sure you: ?Understand these instructions. ?Will watch your condition ?Will get help right away if you are not doing well or get worse. ? ?Your e-visit answers were reviewed by a board certified advanced clinical practitioner to complete your personal care plan.  Depending on the condition, your plan could have included both over the counter or prescription medications. ? ?If there is a problem please reply  once you have received a response from your provider. ? ?Your safety is important to [place].  If you have drug allergies check your prescription carefully.   ? ?You can use MyChart to ask questions about today?s visit, request a non-urgent call back, or ask for a work or school excuse for 24 hours related to this e-Visit. If it has been greater than 24 hours you will need to follow up with your provider, or enter a new e-Visit to address those concerns. ? ?You will get an e-mail in the next two days asking about your experience.  I hope that your e-visit has been  valuable and will speed your recovery. Thank you for using e-visits. ? ? ?I provided 5 minutes of non face-to-face time during this encounter for chart review and documentation.  ? ?

## 2022-03-18 ENCOUNTER — Other Ambulatory Visit: Payer: Self-pay | Admitting: Family Medicine

## 2022-03-31 ENCOUNTER — Other Ambulatory Visit: Payer: Self-pay | Admitting: Family Medicine

## 2022-03-31 DIAGNOSIS — E1169 Type 2 diabetes mellitus with other specified complication: Secondary | ICD-10-CM

## 2022-04-04 ENCOUNTER — Encounter: Payer: Self-pay | Admitting: Family Medicine

## 2022-04-04 ENCOUNTER — Ambulatory Visit (INDEPENDENT_AMBULATORY_CARE_PROVIDER_SITE_OTHER): Payer: BC Managed Care – PPO | Admitting: Family Medicine

## 2022-04-04 VITALS — BP 100/80 | HR 83 | Temp 97.8°F | Resp 18 | Ht 69.0 in | Wt 243.6 lb

## 2022-04-04 DIAGNOSIS — Z0000 Encounter for general adult medical examination without abnormal findings: Secondary | ICD-10-CM | POA: Diagnosis not present

## 2022-04-04 DIAGNOSIS — I420 Dilated cardiomyopathy: Secondary | ICD-10-CM | POA: Diagnosis not present

## 2022-04-04 DIAGNOSIS — E119 Type 2 diabetes mellitus without complications: Secondary | ICD-10-CM | POA: Diagnosis not present

## 2022-04-04 DIAGNOSIS — E1169 Type 2 diabetes mellitus with other specified complication: Secondary | ICD-10-CM | POA: Diagnosis not present

## 2022-04-04 DIAGNOSIS — E785 Hyperlipidemia, unspecified: Secondary | ICD-10-CM

## 2022-04-04 DIAGNOSIS — I5022 Chronic systolic (congestive) heart failure: Secondary | ICD-10-CM

## 2022-04-04 DIAGNOSIS — N182 Chronic kidney disease, stage 2 (mild): Secondary | ICD-10-CM

## 2022-04-04 DIAGNOSIS — I5020 Unspecified systolic (congestive) heart failure: Secondary | ICD-10-CM

## 2022-04-04 DIAGNOSIS — E1165 Type 2 diabetes mellitus with hyperglycemia: Secondary | ICD-10-CM | POA: Diagnosis not present

## 2022-04-04 DIAGNOSIS — N179 Acute kidney failure, unspecified: Secondary | ICD-10-CM

## 2022-04-04 DIAGNOSIS — Z125 Encounter for screening for malignant neoplasm of prostate: Secondary | ICD-10-CM | POA: Diagnosis not present

## 2022-04-04 DIAGNOSIS — I10 Essential (primary) hypertension: Secondary | ICD-10-CM

## 2022-04-04 LAB — LIPID PANEL
Cholesterol: 193 mg/dL (ref 0–200)
HDL: 50.5 mg/dL (ref >39.00)
LDL Cholesterol: 122 mg/dL — ABNORMAL HIGH (ref 0–99)
NonHDL: 142.88
Total CHOL/HDL Ratio: 4
Triglycerides: 102 mg/dL (ref 0.0–149.0)
VLDL: 20.4 mg/dL (ref 0.0–40.0)

## 2022-04-04 LAB — CBC WITH DIFFERENTIAL/PLATELET
Basophils Absolute: 0 10*3/uL (ref 0.0–0.1)
Basophils Relative: 0.7 % (ref 0.0–3.0)
Eosinophils Absolute: 0.2 10*3/uL (ref 0.0–0.7)
Eosinophils Relative: 3.2 % (ref 0.0–5.0)
HCT: 44.9 % (ref 39.0–52.0)
Hemoglobin: 14.3 g/dL (ref 13.0–17.0)
Lymphocytes Relative: 43.9 % (ref 12.0–46.0)
Lymphs Abs: 2.6 10*3/uL (ref 0.7–4.0)
MCHC: 31.8 g/dL (ref 30.0–36.0)
MCV: 84.7 fl (ref 78.0–100.0)
Monocytes Absolute: 0.7 10*3/uL (ref 0.1–1.0)
Monocytes Relative: 12.2 % — ABNORMAL HIGH (ref 3.0–12.0)
Neutro Abs: 2.3 10*3/uL (ref 1.4–7.7)
Neutrophils Relative %: 40 % — ABNORMAL LOW (ref 43.0–77.0)
Platelets: 175 10*3/uL (ref 150.0–400.0)
RBC: 5.3 Mil/uL (ref 4.22–5.81)
RDW: 14.9 % (ref 11.5–15.5)
WBC: 5.8 10*3/uL (ref 4.0–10.5)

## 2022-04-04 LAB — COMPREHENSIVE METABOLIC PANEL
ALT: 26 U/L (ref 0–53)
AST: 24 U/L (ref 0–37)
Albumin: 4.3 g/dL (ref 3.5–5.2)
Alkaline Phosphatase: 35 U/L — ABNORMAL LOW (ref 39–117)
BUN: 18 mg/dL (ref 6–23)
CO2: 29 mEq/L (ref 19–32)
Calcium: 9.6 mg/dL (ref 8.4–10.5)
Chloride: 102 mEq/L (ref 96–112)
Creatinine, Ser: 1.34 mg/dL (ref 0.40–1.50)
GFR: 58.93 mL/min — ABNORMAL LOW (ref >60.00)
Glucose, Bld: 107 mg/dL — ABNORMAL HIGH (ref 70–99)
Potassium: 4.2 mEq/L (ref 3.5–5.1)
Sodium: 139 mEq/L (ref 135–145)
Total Bilirubin: 0.5 mg/dL (ref 0.2–1.2)
Total Protein: 7.2 g/dL (ref 6.0–8.3)

## 2022-04-04 LAB — TSH: TSH: 1.76 u[IU]/mL (ref 0.35–5.50)

## 2022-04-04 LAB — PSA: PSA: 1.35 ng/mL (ref 0.10–4.00)

## 2022-04-04 NOTE — Assessment & Plan Note (Signed)
Tolerating statin, encouraged heart healthy diet, avoid trans fats, minimize simple carbs and saturated fats. Increase exercise as tolerated 

## 2022-04-04 NOTE — Patient Instructions (Signed)

## 2022-04-04 NOTE — Assessment & Plan Note (Signed)
Well controlled, no changes to meds. Encouraged heart healthy diet such as the DASH diet and exercise as tolerated.  °

## 2022-04-04 NOTE — Assessment & Plan Note (Signed)
Encourage heart healthy diet such as MIND or DASH diet, increase exercise, avoid trans fats, simple carbohydrates and processed foods, consider a krill or fish or flaxseed oil cap daily.  °

## 2022-04-04 NOTE — Assessment & Plan Note (Signed)
F/u nephrology 

## 2022-04-04 NOTE — Assessment & Plan Note (Signed)
Encouraging pt to f/u with cardiology ?ekg -- ns st changes--- no change from previous  ?

## 2022-04-04 NOTE — Progress Notes (Signed)
? ?Established Patient Office Visit ? ?Subjective   ?Patient ID: Alfred Martin, male    DOB: [birth year]  Age: 56 y.o. MRN: [id number] ? ?Chief Complaint  ?Patient presents with  ? Annual Exam  ?  Pt states fasting   ? ? ?HPI ? ?Patient Active Problem List  ? Diagnosis Date Noted  ? Screening for colorectal cancer   ? Adenomatous polyp of ascending colon   ? Adenomatous polyp of transverse colon   ? Adenomatous polyp of sigmoid colon   ? Adenomatous polyp of descending colon   ? Rectal polyp   ? Diverticulosis of colon without hemorrhage   ? Grade I internal hemorrhoids   ? Type 2 diabetes mellitus without complication, without long-term current use of insulin ([place]) 07/31/2021  ? Obesity (BMI 30-39.9) 07/04/2021  ? Chronic HFrEF (heart failure with reduced ejection fraction) ([place]) 05/17/2021  ? Radiculopathy 05/17/2021  ? DM2 (diabetes mellitus, type 2) ([place]) 05/17/2021  ? Acute renal failure superimposed on stage 2 chronic kidney disease ([place]) 05/16/2021  ? Allergy   ? CHF (congestive heart failure) ([place])   ? Gout   ? Hyperlipidemia   ? Joint pain   ? Urinary frequency 04/03/2021  ? Fecal occult blood test positive 04/03/2021  ? Hemorrhoids 04/03/2021  ? Uncontrolled type 2 diabetes mellitus with hyperglycemia ([place]) 03/23/2020  ? Olecranon bursitis, left elbow 07/29/2018  ? Class 2 severe obesity with serious comorbidity and body mass index (BMI) of 35.0 to 35.9 in adult [facility]) 05/23/2018  ? Gout of multiple sites 11/21/2017  ? Hyperlipidemia LDL goal <100 08/12/2017  ? Joint swelling 08/25/2015  ? Cardiomyopathy ([place]) 07/27/2015  ? Abnormal echocardiogram 07/27/2015  ? Dyspnea on exertion 06/20/2015  ? HTN (hypertension) 06/01/2015  ? Preventative health care 06/01/2015  ? ?Past Medical History:  ?Diagnosis Date  ? Abnormal echocardiogram 07/27/2015  ? Acute renal failure superimposed on stage 2 chronic kidney disease ([place]) 05/16/2021  ? Allergy   ? Cardiomyopathy ([place]) 07/27/2015  ? Overview:  Dilated.  ? CHF (congestive  heart failure) ([place])   ? Chronic HFrEF (heart failure with reduced ejection fraction) ([place]) 05/17/2021  ? Class 2 severe obesity with serious comorbidity and body mass index (BMI) of 35.0 to 35.9 in adult [facility]) 05/23/2018  ? DM2 (diabetes mellitus, type 2) ([place]) 05/17/2021  ? Dyspnea on exertion 06/20/2015  ? 6 Minute Walk Test 06/20/15- 99% at rest, 97% at end of walk, 99% after 2 minutes rest. POatient walked 528 meters without stopping or incident. No oxgenation limitation on this test.  ? Gout   ? Gout of multiple sites 11/21/2017  ? Guaiac positive stools 04/03/2021  ? Hemorrhoids 04/03/2021  ? HTN (hypertension)   ? Hyperlipidemia   ? diet controlled and exercise  ? Hyperlipidemia LDL goal <100 08/12/2017  ? Joint pain   ? Joint swelling 08/25/2015  ? Obesity (BMI 30-39.9) 07/04/2021  ? Olecranon bursitis, left elbow 07/29/2018  ? Preventative health care 06/01/2015  ? Radiculopathy 05/17/2021  ? Type 2 diabetes mellitus without complication, without long-term current use of insulin ([place]) 07/31/2021  ? Uncontrolled type 2 diabetes mellitus with hyperglycemia ([place]) 03/23/2020  ? Urinary frequency 04/03/2021  ? ?Past Surgical History:  ?Procedure Laterality Date  ? COLONOSCOPY WITH PROPOFOL N/A 09/12/2021  ? Procedure: COLONOSCOPY WITH PROPOFOL;  Surgeon: Lavena Bullion, DO;  [facility];  Service: Gastroenterology;  Laterality: N/A;  ? POLYPECTOMY  09/12/2021  ? Procedure: POLYPECTOMY;  Surgeon: Lavena Bullion, DO;  [facility];  Service: Gastroenterology;;  ? [place] INJECTION  09/12/2021  ? Procedure: SUBMUCOSAL TATTOO INJECTION;  Surgeon: Lavena Bullion, DO;  [facility];  Service: Gastroenterology;;  ? TONSILLECTOMY    ? ?Social History  ? ?Tobacco Use  ? Smoking status: Never  ? Smokeless tobacco: Never  ?Vaping Use  ? Vaping Use: Never used  ?Substance Use Topics  ? Alcohol use: No  ?  Alcohol/week: 0.0 standard drinks  ? Drug use: No  ? ?Social History  ? ?Socioeconomic  History  ? Marital status: Married  ?  Spouse name: [name]  ? Number of children: 4  ? Years of education: Not on file  ? Highest education level: Not on file  ?Occupational History  ? Occupation: high school Engineer, production  ?  Employer: [employer]  ?  Comment: Psychologist, prison and probation services  ?Tobacco Use  ? Smoking status: Never  ? Smokeless tobacco: Never  ?Vaping Use  ? Vaping Use: Never used  ?Substance and Sexual Activity  ? Alcohol use: No  ?  Alcohol/week: 0.0 standard drinks  ? Drug use: No  ? Sexual activity: Yes  ?Other Topics Concern  ? Not on file  ?Social History Narrative  ? Exercise-- jogging 3-4 days a week  ? [place]  ? ?Social Determinants of Health  ? ?Financial Resource Strain: Not on file  ?Food Insecurity: Not on file  ?Transportation Needs: Not on file  ?Physical Activity: Not on file  ?Stress: Not on file  ?Social Connections: Not on file  ?Intimate Partner Violence: Not on file  ? ?Family Status  ?Relation Name Status  ? Mother  Alive  ? Father  Deceased at age 62  ?     cirrhosis, hep c  ? Brother  Alive  ? Brother  Deceased  ? MGM  (Not Specified)  ? PGM  (Not Specified)  ? [place]  ? [place]  ? [place]  ? Annamarie Major  Deceased  ?     chf,   ? Annamarie Major  Alive  ? Annamarie Major  Deceased at age 31  ?     HIV, sepsis  ? Neg Hx  (Not Specified)  ? ?Family History  ?Problem Relation Age of Onset  ? Arthritis Mother   ?     Severe-- RA  ? Diabetes Mother   ? Hyperlipidemia Mother   ? High blood pressure Mother   ? Hypertension Mother   ? Obesity Mother   ? Diabetes Father   ? Hyperlipidemia Father   ? High blood pressure Father   ? Hypertension Father   ? Liver disease Father   ? Diabetes Brother   ? Hypertension Brother   ? HIV/AIDS Brother   ? Hypertension Brother   ? Drug abuse Brother   ?     chf-- crack / cocaine  ? Congestive Heart Failure Brother   ? Heart disease Maternal Grandmother   ? Breast cancer Paternal Grandmother   ? Breast cancer Maternal Aunt    ? Arthritis Maternal Aunt   ?     RA  ? Heart failure Maternal Aunt   ? Arthritis Maternal Aunt   ? Arthritis Maternal Aunt   ? Colon cancer Paternal Uncle   ? Congestive Heart Failure Paternal Uncle   ? Stroke Paternal Uncle   ? Colon polyps Neg Hx   ? Rectal cancer Neg Hx   ?  Stomach cancer Neg Hx   ? ?No Known Allergies ?  ? ?Review of Systems  ?Constitutional:  Negative for fever and malaise/fatigue.  ?HENT:  Negative for congestion.   ?Eyes:  Negative for blurred vision.  ?Respiratory:  Negative for cough and shortness of breath.   ?Cardiovascular:  Negative for chest pain, palpitations and leg swelling.  ?Gastrointestinal:  Negative for abdominal pain, blood in stool, nausea and vomiting.  ?Genitourinary:  Negative for dysuria and frequency.  ?Musculoskeletal:  Negative for back pain and falls.  ?Skin:  Negative for rash.  ?Neurological:  Negative for dizziness, loss of consciousness and headaches.  ?Endo/Heme/Allergies:  Negative for environmental allergies.  ?Psychiatric/Behavioral:  Negative for depression. The patient is not nervous/anxious.   ? ?  ?Objective:  ?  ? ?BP 100/80 (BP Location: Left Arm, Patient Position: Sitting, Cuff Size: Large)   Pulse 83   Temp 97.8 ?F (36.6 ?C) (Oral)   Resp 18   Ht '5\' 9"'$  (1.753 m)   Wt 243 lb 9.6 oz (110.5 kg)   SpO2 98%   BMI 35.97 kg/m?  ?BP Readings from Last 3 Encounters:  ?04/04/22 100/80  ?01/29/22 122/84  ?10/04/21 126/70  ? ?Wt Readings from Last 3 Encounters:  ?04/04/22 243 lb 9.6 oz (110.5 kg)  ?01/29/22 235 lb 9.6 oz (106.9 kg)  ?10/04/21 229 lb 9.6 oz (104.1 kg)  ? ?SpO2 Readings from Last 3 Encounters:  ?04/04/22 98%  ?01/29/22 96%  ?10/04/21 99%  ? ?  ? ?Physical Exam ?Vitals and nursing note reviewed.  ?Constitutional:   ?   General: He is not in acute distress. ?   Appearance: He is well-developed. He is not diaphoretic.  ?HENT:  ?   Head: Normocephalic and atraumatic.  ?   Right Ear: External ear normal.  ?   Left Ear: External ear normal.  ?    Nose: Nose normal.  ?   Mouth/Throat:  ?   Pharynx: No oropharyngeal exudate.  ?Eyes:  ?   General:     ?   Right eye: No discharge.     ?   Left eye: No discharge.  ?   Conjunctiva/sclera: Conjunctivae nor

## 2022-04-04 NOTE — Assessment & Plan Note (Signed)
Per endo °

## 2022-04-08 ENCOUNTER — Other Ambulatory Visit: Payer: Self-pay | Admitting: Family Medicine

## 2022-04-08 DIAGNOSIS — E785 Hyperlipidemia, unspecified: Secondary | ICD-10-CM

## 2022-04-09 ENCOUNTER — Other Ambulatory Visit: Payer: Self-pay

## 2022-04-09 MED ORDER — EZETIMIBE 10 MG PO TABS: 10.0000 mg | ORAL_TABLET | Freq: Every day | ORAL | 1 refills | Status: DC

## 2022-04-26 ENCOUNTER — Encounter: Payer: Self-pay | Admitting: Cardiology

## 2022-04-26 NOTE — Progress Notes (Signed)
Referring-Yvonne Lowne Chase DO Reason for referral-congestive heart failure  HPI: 56 year old male for evaluation of congestive heart failure at request of [name] DO.  Previously followed by Dr. Harriet Masson.  Long h/o CM. Cardiac catheterization in the past showed no significant coronary disease by report but no records available.  Echocardiogram June 2022 showed ejection fraction 25 to 30%, moderate left ventricular enlargement, mild mitral regurgitation.  Note LV function similar to July 2016.  Monitor June 2022 showed sinus rhythm with occasional PACs, PVCs (3.2%).  Patient denies dyspnea on exertion, orthopnea, PND, pedal edema, chest pain or syncope.  He did state he had some weakness with Entresto in the past.  Current Outpatient Medications  Medication Sig Dispense Refill   ACCU-CHEK GUIDE test strip USE AS DIRECTED UP TO 4 TIMES A DAY TO CHECK BLOOD SUGAR 100 strip 1   Accu-Chek Softclix Lancets lancets USE TO CHECK GLUCOSE THREE TIMES DAILY 100 each 1   allopurinol (ZYLOPRIM) 300 MG tablet TAKE 1 TABLET BY MOUTH EVERY DAY 90 tablet 2   amLODipine-benazepril (LOTREL) 10-40 MG capsule TAKE 1 CAPSULE BY MOUTH EVERY DAY 90 capsule 1   azelastine (ASTELIN) 0.1 % nasal spray Place 1 spray into both nostrils 2 (two) times daily. Use in each nostril as directed 30 mL 12   ezetimibe (ZETIA) 10 MG tablet Take 1 tablet (10 mg total) by mouth daily. 90 tablet 1   fluticasone (FLONASE) 50 MCG/ACT nasal spray SPRAY 2 SPRAYS INTO EACH NOSTRIL EVERY DAY 48 mL 1   hydrochlorothiazide (HYDRODIURIL) 25 MG tablet TAKE 1 TABLET BY MOUTH EVERY DAY 90 tablet 1   hydroquinone 4 % cream APPLY TO AFFECTED AREA TWICE A DAY 28.35 g 1   levocetirizine (XYZAL) 5 MG tablet TAKE 1 TABLET BY MOUTH EVERY DAY IN THE EVENING 90 tablet 1   methocarbamol (ROBAXIN) 500 MG tablet Take 1 tablet (500 mg total) by mouth every 8 (eight) hours as needed for muscle spasms. 30 tablet 0   metoprolol succinate (TOPROL-XL) 25  MG 24 hr tablet TAKE 1 TABLET BY MOUTH EVERY DAY 90 tablet 1   rosuvastatin (CRESTOR) 40 MG tablet TAKE 1 TABLET BY MOUTH EVERY DAY 90 tablet 1   ENTRESTO 24-26 MG Take 1 tablet by mouth 2 (two) times daily. (Patient not taking: Reported on 05/07/2022)     No current facility-administered medications for this visit.    No Known Allergies   Past Medical History:  Diagnosis Date   Abnormal echocardiogram 07/27/2015   Acute renal failure superimposed on stage 2 chronic kidney disease ([place]) 05/16/2021   Allergy    Cardiomyopathy ([place]) 07/27/2015   Overview:  Dilated.   Chronic HFrEF (heart failure with reduced ejection fraction) ([place]) 05/17/2021   Class 2 severe obesity with serious comorbidity and body mass index (BMI) of 35.0 to 35.9 in adult [facility]) 05/23/2018   DM2 (diabetes mellitus, type 2) ([place]) 05/17/2021   Dyspnea on exertion 06/20/2015   6 Minute Walk Test 06/20/15- 99% at rest, 97% at end of walk, 99% after 2 minutes rest. POatient walked 528 meters without stopping or incident. No oxgenation limitation on this test.   Gout of multiple sites 11/21/2017   Guaiac positive stools 04/03/2021   Hemorrhoids 04/03/2021   HTN (hypertension)    Hyperlipidemia LDL goal <100 08/12/2017   Joint pain    Joint swelling 08/25/2015   Obesity (BMI 30-39.9) 07/04/2021   Olecranon bursitis, left elbow 07/29/2018   Preventative health care  06/01/2015   Radiculopathy 05/17/2021   Urinary frequency 04/03/2021    Past Surgical History:  Procedure Laterality Date   COLONOSCOPY WITH PROPOFOL N/A 09/12/2021   Procedure: COLONOSCOPY WITH PROPOFOL;  Surgeon: Lavena Bullion, DO;  Location: [facility];  Service: Gastroenterology;  Laterality: N/A;   POLYPECTOMY  09/12/2021   Procedure: POLYPECTOMY;  Surgeon: Lavena Bullion, DO;  Location: [facility];  Service: Gastroenterology;;   SUBMUCOSAL TATTOO INJECTION  09/12/2021   Procedure: SUBMUCOSAL TATTOO INJECTION;  Surgeon: Lavena Bullion, DO;  Location: [facility];  Service: Gastroenterology;;   TONSILLECTOMY      Social History   Socioeconomic History   Marital status: Married    Spouse name: [name]   Number of children: 4   Years of education: Not on file   Highest education level: Not on file  Occupational History   Occupation: high school english teacer    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, prison and probation services  Tobacco Use   Smoking status: Never   Smokeless tobacco: Never  Vaping Use   Vaping Use: Never used  Substance and Sexual Activity   Alcohol use: No    Alcohol/week: 0.0 standard drinks   Drug use: No   Sexual activity: Yes  Other Topics Concern   Not on file  Social History Narrative   Exercise-- jogging 3-4 days a week   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   Social Determinants of Radio broadcast assistant Strain: Not on file  Food Insecurity: Not on file  Transportation Needs: Not on file  Physical Activity: Not on file  Stress: Not on file  Social Connections: Not on file  Intimate Partner Violence: Not on file    Family History  Problem Relation Age of Onset   Arthritis Mother        Severe-- RA   Diabetes Mother    Hyperlipidemia Mother    High blood pressure Mother    Hypertension Mother    Obesity Mother    Diabetes Father    Hyperlipidemia Father    High blood pressure Father    Hypertension Father    Liver disease Father    Diabetes Brother    Hypertension Brother    HIV/AIDS Brother    Hypertension Brother    Drug abuse Brother        chf-- crack / cocaine   Congestive Heart Failure Brother    Heart disease Maternal Grandmother    Breast cancer Paternal Grandmother    Breast cancer Maternal Aunt    Arthritis Maternal Aunt        RA   Heart failure Maternal Aunt    Arthritis Maternal Aunt    Arthritis Maternal Aunt    Colon cancer Paternal Uncle    Congestive Heart Failure Paternal Uncle    Stroke Paternal Uncle    Colon polyps Neg Hx    Rectal cancer  Neg Hx    Stomach cancer Neg Hx     ROS: no fevers or chills, productive cough, hemoptysis, dysphasia, odynophagia, melena, hematochezia, dysuria, hematuria, rash, seizure activity, orthopnea, PND, pedal edema, claudication. Remaining systems are negative.  Physical Exam:   Blood pressure 126/66, pulse (!) 51, height '5\' 9"'$  (1.753 m), weight 243 lb (110.2 kg).  General:  Well developed/well nourished in NAD Skin warm/dry Patient not depressed No peripheral clubbing Back-normal HEENT-normal/normal eyelids Neck supple/normal carotid upstroke bilaterally; no bruits; no JVD; no thyromegaly chest - CTA/ normal expansion CV -  RRR/normal S1 and S2; no murmurs, rubs or gallops;  PMI nondisplaced Abdomen -NT/ND, no HSM, no mass, + bowel sounds, no bruit 2+ femoral pulses, no bruits Ext-no edema, chords, 2+ DP Neuro-grossly nonfocal  ECG -Apr 04, 2022-sinus rhythm, IVCD, inferolateral T wave inversion.  Personally reviewed  A/P  1 cardiomyopathy-felt to be nonischemic in the past.  He apparently had a cardiac catheterization in [place] years ago.  At that time his blood pressure was significantly elevated and his cardiomyopathy may have been hypertensive mediated.  However most recent echocardiogram showed continued LV dysfunction.  He does not consume alcohol.  We will plan to repeat echocardiogram to reassess LV function.  If he continues with cardiomyopathy would plan to proceed with cardiac CTA to rule out obstructive coronary disease.  I will continue present medications including Lotrel and Toprol for now.  If LV function reduced I will discontinue Lotrel and retry Entresto.  If he tolerates we will titrate as tolerated.  Would also need to consider addition of spironolactone and Farxiga.  Would plan cardiac MRI once medications fully titrated.  Ultimately if LV function did not improve with full titration of medications would need to consider ICD.  2 chronic systolic congestive heart  failure-he appears to be euvolemic.  Plan as outlined above.  We will continue HCTZ for now.  Add Jordan later if LV function remains decreased.  3 hypertension-continue present medications.  4 hyperlipidemia-continue Zetia and Crestor.  Kirk Ruths, MD

## 2022-05-06 ENCOUNTER — Other Ambulatory Visit: Payer: Self-pay | Admitting: Family Medicine

## 2022-05-07 ENCOUNTER — Ambulatory Visit: Payer: BC Managed Care – PPO | Admitting: Cardiology

## 2022-05-07 ENCOUNTER — Encounter: Payer: Self-pay | Admitting: Cardiology

## 2022-05-07 VITALS — BP 126/66 | HR 51 | Ht 69.0 in | Wt 243.0 lb

## 2022-05-07 DIAGNOSIS — I5022 Chronic systolic (congestive) heart failure: Secondary | ICD-10-CM | POA: Diagnosis not present

## 2022-05-07 DIAGNOSIS — I420 Dilated cardiomyopathy: Secondary | ICD-10-CM | POA: Diagnosis not present

## 2022-05-07 DIAGNOSIS — I10 Essential (primary) hypertension: Secondary | ICD-10-CM | POA: Diagnosis not present

## 2022-05-07 NOTE — Patient Instructions (Signed)
  Testing/Procedures:  Your physician has requested that you have an echocardiogram. Echocardiography is a painless test that uses sound waves to create images of your heart. It provides your doctor with information about the size and shape of your heart and how well your heart's chambers and valves are working. This procedure takes approximately one hour. There are no restrictions for this procedure. [facility]-1ST FLOOR IMAGING DEPARTMENT   Follow-Up: At [facility], you and your health needs are our priority.  As part of our continuing mission to provide you with exceptional heart care, we have created designated Provider Care Teams.  These Care Teams include your primary Cardiologist (physician) and Advanced Practice Providers (APPs -  Physician Assistants and Nurse Practitioners) who all work together to provide you with the care you need, when you need it.  We recommend signing up for the patient portal called "MyChart".  Sign up information is provided on this After Visit Summary.  MyChart is used to connect with patients for Virtual Visits (Telemedicine).  Patients are able to view lab/test results, encounter notes, upcoming appointments, etc.  Non-urgent messages can be sent to your provider as well.   To learn more about what you can do with MyChart, go to [email or web address].    Your next appointment:   6 week(s)  The format for your next appointment:   In Person  Provider:   Kirk Ruths, MD IN THE [facility]

## 2022-05-13 ENCOUNTER — Ambulatory Visit (HOSPITAL_BASED_OUTPATIENT_CLINIC_OR_DEPARTMENT_OTHER): Admission: RE | Admit: 2022-05-13 | Payer: BC Managed Care – PPO | Source: Ambulatory Visit

## 2022-06-11 ENCOUNTER — Telehealth: Payer: Self-pay

## 2022-06-11 ENCOUNTER — Other Ambulatory Visit: Payer: Self-pay | Admitting: Family Medicine

## 2022-06-11 DIAGNOSIS — M4802 Spinal stenosis, cervical region: Secondary | ICD-10-CM

## 2022-06-11 MED ORDER — METHOCARBAMOL 500 MG PO TABS: 500.0000 mg | ORAL_TABLET | Freq: Three times a day (TID) | ORAL | 0 refills | Status: DC | PRN

## 2022-06-11 MED ORDER — METHYLPREDNISOLONE 4 MG PO TBPK: ORAL_TABLET | ORAL | 0 refills | Status: DC

## 2022-06-11 NOTE — Telephone Encounter (Signed)
Dr. Etter Sjogren Pt  Pt called in states that he was given a muscle relaxer and prednisone for neck pain. He states that he is having the same problem again. Pt was seen 02/25/22 for e-visit for this problem.

## 2022-06-12 NOTE — Telephone Encounter (Signed)
Pt aware.

## 2022-06-16 ENCOUNTER — Emergency Department (HOSPITAL_BASED_OUTPATIENT_CLINIC_OR_DEPARTMENT_OTHER)
Admission: EM | Admit: 2022-06-16 | Discharge: 2022-06-16 | Disposition: A | Discharge status: Home / Self Care | Payer: BC Managed Care – PPO | Attending: Emergency Medicine | Admitting: Emergency Medicine

## 2022-06-16 ENCOUNTER — Encounter (HOSPITAL_BASED_OUTPATIENT_CLINIC_OR_DEPARTMENT_OTHER): Payer: Self-pay | Admitting: Emergency Medicine

## 2022-06-16 ENCOUNTER — Other Ambulatory Visit: Payer: Self-pay

## 2022-06-16 DIAGNOSIS — I130 Hypertensive heart and chronic kidney disease with heart failure and stage 1 through stage 4 chronic kidney disease, or unspecified chronic kidney disease: Secondary | ICD-10-CM | POA: Insufficient documentation

## 2022-06-16 DIAGNOSIS — M542 Cervicalgia: Secondary | ICD-10-CM | POA: Diagnosis present

## 2022-06-16 DIAGNOSIS — N182 Chronic kidney disease, stage 2 (mild): Secondary | ICD-10-CM | POA: Diagnosis not present

## 2022-06-16 DIAGNOSIS — Z7951 Long term (current) use of inhaled steroids: Secondary | ICD-10-CM | POA: Diagnosis not present

## 2022-06-16 DIAGNOSIS — I509 Heart failure, unspecified: Secondary | ICD-10-CM | POA: Insufficient documentation

## 2022-06-16 DIAGNOSIS — Z79899 Other long term (current) drug therapy: Secondary | ICD-10-CM | POA: Insufficient documentation

## 2022-06-16 DIAGNOSIS — M5412 Radiculopathy, cervical region: Secondary | ICD-10-CM | POA: Insufficient documentation

## 2022-06-16 DIAGNOSIS — E1122 Type 2 diabetes mellitus with diabetic chronic kidney disease: Secondary | ICD-10-CM | POA: Insufficient documentation

## 2022-06-16 MED ORDER — HYDROMORPHONE HCL 1 MG/ML IJ SOLN
0.5000 mg | Freq: Once | INTRAMUSCULAR | Status: AC
  Administered 2022-06-16: 0.5 mg via INTRAMUSCULAR
  Filled 2022-06-16: qty 1

## 2022-06-16 MED ORDER — GABAPENTIN 100 MG PO CAPS
100.0000 mg | ORAL_CAPSULE | Freq: Once | ORAL | Status: AC
  Administered 2022-06-16: 100 mg via ORAL
  Filled 2022-06-16: qty 1

## 2022-06-16 MED ORDER — GABAPENTIN 100 MG PO CAPS: 100.0000 mg | ORAL_CAPSULE | Freq: Three times a day (TID) | ORAL | 0 refills | Status: DC

## 2022-06-16 NOTE — ED Provider Notes (Signed)
[facility] Provider Note   CSN: [id number] Arrival date & time: 06/16/22  1137     History  Chief Complaint  Patient presents with   Neck Pain    Alfred [place] is a 56 y.o. male with medical history significant for heart failure, diabetes 2, cervical radiculopathy diagnosed in 2022, stage II chronic kidney disease, hypertension.  The patient presents to the ED for evaluation of neck pain.  Patient reports that around 1 year ago he was diagnosed with cervical radiculopathy when he was admitted for acute kidney injury.  The patient states that at this time he was seen by neurosurgery in the hospital and advised that he would need surgery for bulging disks.  The patient states that he never followed up with neurosurgery after being discharged.  Patient reports that beginning this past Monday after a long car ride from [place] to [place] and then [place] to [place] the patient began having increased centralized cervical neck pain.  The patient states that this pain radiates down his right arm and then stops at his elbow and is described as "a tingle".  The patient states that he has been seen by his PCP for this issue and placed on a Medrol Dosepak however the patient states that all the Medrol Dosepak has done is raise his blood sugars.  Patient reports that when he was admitted and found to have cervical radiculopathy 1 year ago they gave him "something through an IV" and it relieved his pain.  The patient is unable to tell me what this medication was.  The patient denies any recent trauma or injury to account for the neck pain.  The patient denies any chest pain, shortness of breath, nausea, vomiting, fevers, abdominal pain, lightheadedness, dizziness or weakness.   Neck Pain Associated symptoms: no chest pain, no fever and no weakness        Home Medications Prior to Admission medications   Medication Sig Start Date End Date Taking? Authorizing  Provider  gabapentin (NEURONTIN) 100 MG capsule Take 1 capsule (100 mg total) by mouth 3 (three) times daily. 06/16/22  Yes Genevive Bi F, PA-C  ACCU-CHEK GUIDE test strip USE AS DIRECTED UP TO 4 TIMES A DAY TO CHECK BLOOD SUGAR 05/06/22   Carollee Herter, Kendrick Fries R, DO  Accu-Chek Softclix Lancets lancets USE TO CHECK GLUCOSE THREE TIMES DAILY 06/08/21   Carollee Herter, Kendrick Fries R, DO  allopurinol (ZYLOPRIM) 300 MG tablet TAKE 1 TABLET BY MOUTH EVERY DAY 02/25/22   Carollee Herter, Yvonne R, DO  amLODipine-benazepril (LOTREL) 10-40 MG capsule TAKE 1 CAPSULE BY MOUTH EVERY DAY 04/01/22   Carollee Herter, [name], DO  azelastine (ASTELIN) 0.1 % nasal spray Place 1 spray into both nostrils 2 (two) times daily. Use in each nostril as directed 12/30/21   [place], [place], PA-C  ENTRESTO 24-26 MG Take 1 tablet by mouth 2 (two) times daily. Patient not taking: Reported on 05/07/2022 10/13/21   [provider]  ezetimibe (ZETIA) 10 MG tablet Take 1 tablet (10 mg total) by mouth daily. 04/09/22   Carollee Herter, Kendrick Fries R, DO  fluticasone (FLONASE) 50 MCG/ACT nasal spray SPRAY 2 SPRAYS INTO EACH NOSTRIL EVERY DAY 02/25/22   Carollee Herter, Yvonne R, DO  hydrochlorothiazide (HYDRODIURIL) 25 MG tablet TAKE 1 TABLET BY MOUTH EVERY DAY 02/25/22   Carollee Herter, Kendrick Fries R, DO  hydroquinone 4 % cream APPLY TO AFFECTED AREA TWICE A DAY 01/28/22   [name], DO  levocetirizine (XYZAL) 5 MG tablet TAKE 1 TABLET BY MOUTH EVERY DAY IN THE EVENING 02/25/22   Carollee Herter, [name], DO  methocarbamol (ROBAXIN) 500 MG tablet Take 1 tablet (500 mg total) by mouth every 8 (eight) hours as needed for muscle spasms. 06/11/22   Mosie Lukes, MD  methylPREDNISolone (MEDROL DOSEPAK) 4 MG TBPK tablet 6 day taper; take as directed on package instructions 06/11/22   Mosie Lukes, MD  metoprolol succinate (TOPROL-XL) 25 MG 24 hr tablet TAKE 1 TABLET BY MOUTH EVERY DAY 05/06/22   Carollee Herter, [name], DO  rosuvastatin (CRESTOR) 40 MG tablet TAKE 1  TABLET BY MOUTH EVERY DAY 04/01/22   [name], DO      Allergies    Patient has no known allergies.    Review of Systems   Review of Systems  Constitutional:  Negative for chills and fever.  Respiratory:  Negative for shortness of breath.   Cardiovascular:  Negative for chest pain.  Gastrointestinal:  Negative for abdominal pain, nausea and vomiting.  Musculoskeletal:  Positive for neck pain. Negative for back pain.  Neurological:  Negative for dizziness, weakness and light-headedness.  All other systems reviewed and are negative.   Physical Exam Updated Vital Signs BP 119/83   Pulse 72   Temp 97.8 F (36.6 C) (Oral)   Resp 16   SpO2 96%  Physical Exam Vitals and nursing note reviewed.  Constitutional:      General: He is not in acute distress.    Appearance: He is not ill-appearing, toxic-appearing or diaphoretic.  HENT:     Head: Normocephalic and atraumatic.     Nose: Nose normal. No congestion.     Mouth/Throat:     Mouth: Mucous membranes are moist.     Pharynx: Oropharynx is clear.  Eyes:     Extraocular Movements: Extraocular movements intact.     Conjunctiva/sclera: Conjunctivae normal.  Cardiovascular:     Rate and Rhythm: Normal rate and regular rhythm.  Pulmonary:     Effort: Pulmonary effort is normal.     Breath sounds: Normal breath sounds. No wheezing.  Abdominal:     General: Abdomen is flat. Bowel sounds are normal.     Palpations: Abdomen is soft.     Tenderness: There is no abdominal tenderness.  Musculoskeletal:     Cervical back: Rigidity and tenderness present. No deformity, erythema, signs of trauma or lacerations. Pain with movement present. Decreased range of motion.     Comments: Patient with decreased range of motion to cervical spine right greater than left.  Patient has positive Spurling maneuver to the right.  Skin:    General: Skin is warm and dry.     Capillary Refill: Capillary refill takes less than 2 seconds.   Neurological:     Mental Status: He is alert and oriented to person, place, and time.     ED Results / Procedures / Treatments   Labs (all labs ordered are listed, but only abnormal results are displayed) Labs Reviewed - No data to display  EKG EKG Interpretation  Date/Time:  Sunday June 16 2022 12:03:55 EDT Ventricular Rate:  83 PR Interval:  170 QRS Duration: 110 QT Interval:  384 QTC Calculation: 451 R Axis:   101 Text Interpretation: Sinus rhythm with occasional Premature ventricular complexes Biatrial enlargement Rightward axis Cannot rule out Anterior infarct , age undetermined ST & T wave abnormality, consider inferolateral ischemia Abnormal ECG When compared with ECG of  16-May-2021 20:33, increased ischemic changes now Confirmed by Aletta Edouard [phone]) on 06/16/2022 12:11:42 PM  Radiology No results found.  Procedures Procedures   Medications Ordered in ED Medications  HYDROmorphone (DILAUDID) injection 0.5 mg (0.5 mg Intramuscular Given 06/16/22 1632)  gabapentin (NEURONTIN) capsule 100 mg (100 mg Oral Given 06/16/22 1632)    ED Course/ Medical Decision Making/ A&P                           Medical Decision Making Risk Prescription drug management.   56 year old male presents to the ED for evaluation.  Please see HPI for further details.  On examination, the patient does have centralized C-spine tenderness with radiation down his right arm.  The patient is afebrile and nontachycardic.  The patient lung sounds are clear bilaterally.  The patient abdomen is soft and compressible in all 4 quadrants.  Patient is noted to be hypotensive at 108/54 however on chart review it appears that the patient does have a history of hypertension based on vital signs that of been recorded at previous office visits.  The patient denies any lightheadedness, dizziness, weakness.  Patient only complaining of neck pain.  Patient does have positive Spurling maneuver to right.  On  chart review appears that this patient was given Dilaudid on his previous hospitalization.  I have discussed with the patient that giving narcotic pain medication for radiculopathy is not in his best interest.  I stated to the patient that he will be best suited to follow-up with his primary care doctor and get referred to a neurosurgeon.  Patient voiced understanding of this but continued to request something to "take the edge off".  I provided the patient with a 0.5 mg intramuscular shot of Dilaudid as well as an initial capsule of 100 mg of gabapentin.  I will send the patient home on gabapentin and have him follow-up with his primary care doctor for further management of cervical radiculopathy.  The patient has been given return precautions and he is voiced understanding of them.  The patient has had all of his questions answered to his satisfaction.  The patient is stable at this time for discharge home.  Final Clinical Impression(s) / ED Diagnoses Final diagnoses:  Cervical radiculopathy    Rx / DC Orders ED Discharge Orders          Ordered    gabapentin (NEURONTIN) 100 MG capsule  3 times daily        06/16/22 1700              Azucena Cecil, PA-C 06/16/22 1701    Fredia Sorrow, MD 06/21/22 1649

## 2022-06-16 NOTE — ED Triage Notes (Signed)
Pt reports hx of degenerative disc disease in C3-5. Pt having pain in neck going down R arm.

## 2022-06-16 NOTE — Discharge Instructions (Addendum)
Please return to the ED with any new or worsening signs or symptoms Please follow-up with your PCP for further management of your cervical radiculopathy Please begin taking gabapentin 3 times daily as prescribed. Please read the attached informational guide concerning cervical radiculopathy as well as radicular pain

## 2022-06-16 NOTE — ED Notes (Signed)
Pt asked to speak with charge RN. Charge RN was on the phone, this RN as triage nurse responded to pts request. Pt endorses concern for the wait d/t pain. Explained to pt and spouse that we are seeing pts as based on acuity and time. Pts spouse requested that pain be a consideration of acuity and this was explained that pain is addressed as acuity of pt. Pt states "I just need the pain shot I got last time". Told pt that he would need to be assessed by provider before medications administered.

## 2022-06-17 ENCOUNTER — Telehealth: Payer: Self-pay | Admitting: Family Medicine

## 2022-06-17 ENCOUNTER — Other Ambulatory Visit: Payer: Self-pay | Admitting: Family Medicine

## 2022-06-17 DIAGNOSIS — M4802 Spinal stenosis, cervical region: Secondary | ICD-10-CM

## 2022-06-17 NOTE — Telephone Encounter (Signed)
Pt made aware

## 2022-06-17 NOTE — Telephone Encounter (Signed)
Pt called requesting a referral for a neurologist in reference to the narrowing in their spine. Pt stated that this issue has been addressed with Dr. Etter Sjogren before.

## 2022-06-17 NOTE — Progress Notes (Signed)
HPI: FU CHF. Previously followed by Dr. Harriet Masson.  Long h/o CM. Cardiac catheterization in the past showed no significant coronary disease by report but no records available.  Echocardiogram June 2022 showed ejection fraction 25 to 30%, moderate left ventricular enlargement, mild mitral regurgitation.  Note LV function similar to July 2016.  Monitor June 2022 showed sinus rhythm with occasional PACs, PVCs (3.2%).  Follow-up echocardiogram and cardiac CTA ordered at last office visit but not performed.  Since last seen patient denies dyspnea, chest pain, palpitations or syncope.  Current Outpatient Medications  Medication Sig Dispense Refill   ACCU-CHEK GUIDE test strip USE AS DIRECTED UP TO 4 TIMES A DAY TO CHECK BLOOD SUGAR 100 strip 1   Accu-Chek Softclix Lancets lancets USE TO CHECK GLUCOSE THREE TIMES DAILY 100 each 1   allopurinol (ZYLOPRIM) 300 MG tablet TAKE 1 TABLET BY MOUTH EVERY DAY 90 tablet 2   amLODipine-benazepril (LOTREL) 10-40 MG capsule TAKE 1 CAPSULE BY MOUTH EVERY DAY 90 capsule 1   azelastine (ASTELIN) 0.1 % nasal spray Place 1 spray into both nostrils 2 (two) times daily. Use in each nostril as directed 30 mL 12   ENTRESTO 24-26 MG Take 1 tablet by mouth 2 (two) times daily.     ezetimibe (ZETIA) 10 MG tablet Take 1 tablet (10 mg total) by mouth daily. 90 tablet 1   fluticasone (FLONASE) 50 MCG/ACT nasal spray SPRAY 2 SPRAYS INTO EACH NOSTRIL EVERY DAY 48 mL 1   gabapentin (NEURONTIN) 100 MG capsule Take 1 capsule (100 mg total) by mouth 3 (three) times daily. 90 capsule 0   hydrochlorothiazide (HYDRODIURIL) 25 MG tablet TAKE 1 TABLET BY MOUTH EVERY DAY 90 tablet 1   hydroquinone 4 % cream APPLY TO AFFECTED AREA TWICE A DAY 28.35 g 1   levocetirizine (XYZAL) 5 MG tablet TAKE 1 TABLET BY MOUTH EVERY DAY IN THE EVENING 90 tablet 1   meloxicam (MOBIC) 15 MG tablet Take 1 tablet (15 mg total) by mouth daily. 30 tablet 0   methocarbamol (ROBAXIN) 500 MG tablet Take 1 tablet  (500 mg total) by mouth every 8 (eight) hours as needed for muscle spasms. 30 tablet 0   metoprolol succinate (TOPROL-XL) 25 MG 24 hr tablet TAKE 1 TABLET BY MOUTH EVERY DAY 90 tablet 1   rosuvastatin (CRESTOR) 40 MG tablet TAKE 1 TABLET BY MOUTH EVERY DAY 90 tablet 1   traMADol (ULTRAM) 50 MG tablet Take 1 tablet (50 mg total) by mouth every 8 (eight) hours as needed for up to 5 days. 15 tablet 0   No current facility-administered medications for this visit.     Past Medical History:  Diagnosis Date   Abnormal echocardiogram 07/27/2015   Acute renal failure superimposed on stage 2 chronic kidney disease ([place]) 05/16/2021   Allergy    Cardiomyopathy ([place]) 07/27/2015   Overview:  Dilated.   Chronic HFrEF (heart failure with reduced ejection fraction) ([place]) 05/17/2021   Class 2 severe obesity with serious comorbidity and body mass index (BMI) of 35.0 to 35.9 in adult [facility]) 05/23/2018   DM2 (diabetes mellitus, type 2) ([place]) 05/17/2021   Dyspnea on exertion 06/20/2015   6 Minute Walk Test 06/20/15- 99% at rest, 97% at end of walk, 99% after 2 minutes rest. POatient walked 528 meters without stopping or incident. No oxgenation limitation on this test.   Gout of multiple sites 11/21/2017   Guaiac positive stools 04/03/2021   Hemorrhoids 04/03/2021   HTN (  hypertension)    Hyperlipidemia LDL goal <100 08/12/2017   Joint pain    Joint swelling 08/25/2015   Obesity (BMI 30-39.9) 07/04/2021   Olecranon bursitis, left elbow 07/29/2018   Preventative health care 06/01/2015   Radiculopathy 05/17/2021   Urinary frequency 04/03/2021    Past Surgical History:  Procedure Laterality Date   COLONOSCOPY WITH PROPOFOL N/A 09/12/2021   Procedure: COLONOSCOPY WITH PROPOFOL;  Surgeon: Lavena Bullion, DO;  Location: [facility];  Service: Gastroenterology;  Laterality: N/A;   POLYPECTOMY  09/12/2021   Procedure: POLYPECTOMY;  Surgeon: Lavena Bullion, DO;  Location: [facility];  Service:  Gastroenterology;;   SUBMUCOSAL TATTOO INJECTION  09/12/2021   Procedure: SUBMUCOSAL TATTOO INJECTION;  Surgeon: Lavena Bullion, DO;  [facility];  Service: Gastroenterology;;   TONSILLECTOMY      Social History   Socioeconomic History   Marital status: Married    Spouse name: [name]   Number of children: 4   Years of education: Not on file   Highest education level: Not on file  Occupational History   Occupation: high school english teacer    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   Social Determinants of Radio broadcast assistant Strain: Not on file  Food Insecurity: Not on file  Transportation Needs: Not on file  Physical Activity: Not on file  Stress: Not on file  Social Connections: Not on file  Intimate Partner Violence: Not on file    Family History  Problem Relation Age of Onset   Arthritis Mother        Severe-- RA   Diabetes Mother    Hyperlipidemia Mother    High blood pressure Mother    Hypertension Mother    Obesity Mother    Diabetes Father    Hyperlipidemia Father    High blood pressure Father    Hypertension Father    Liver disease Father    Diabetes Brother    Hypertension Brother    HIV/AIDS Brother    Hypertension Brother    Drug abuse Brother        chf-- crack / cocaine   Congestive Heart Failure Brother    Heart disease Maternal Grandmother    Breast cancer Paternal Grandmother    Breast cancer Maternal Aunt    Arthritis Maternal Aunt        RA   Heart failure Maternal Aunt    Arthritis Maternal Aunt    Arthritis Maternal Aunt     Colon cancer Paternal Uncle    Congestive Heart Failure Paternal Uncle    Stroke Paternal Uncle    Colon polyps Neg Hx    Rectal cancer Neg Hx    Stomach cancer Neg Hx     ROS: no fevers or chills, productive cough, hemoptysis, dysphasia, odynophagia, melena, hematochezia, dysuria, hematuria, rash, seizure activity, orthopnea, PND, pedal edema, claudication. Remaining systems are negative.  Physical Exam: Well-developed well-nourished in no acute distress.  Skin is warm and dry.  HEENT is normal.  Neck is supple.  Chest is clear to auscultation with normal expansion.  Cardiovascular exam is regular rate and rhythm.  Abdominal exam nontender or distended. No masses palpated. Extremities show no edema. neuro grossly intact  ECG-normal sinus rhythm at a rate of 78, normal axis, left atrial enlargement, lateral T wave inversion.  Personally reviewed  A/P  1 cardiomyopathy-as outlined in previous notes etiology of cardiomyopathy is unclear.  He does not consume alcohol.  Previous catheterization by report showed no coronary disease.  His LV function remained decreased on last echocardiogram.  We will arrange cardiac CTA to rule out obstructive coronary disease (he did not proceed when we ordered previously but is willing to now).  Repeat echocardiogram to reassess LV function (patient did not follow-up for previously ordered echocardiogram).  If LV function is decreased would discontinue Lotrel and retry Entresto 24/26 twice daily (delay initiation of Entresto until patient has been off of Lotrel for 2 days).  Titrate medications as tolerated.  Would also need to consider Farxiga and spironolactone if patient willing.  Once medications fully titrated would need cardiac MRI and if LV function did not improve would need to consider ICD.  2 chronic systolic congestive heart failure-patient is euvolemic on examination.  We will consider medication adjustments pending follow-up echocardiogram as  outlined above.  3 Hypertension-controlled.  Continue present medications for now.  4 hyperlipidemia-continue Zetia and Crestor.  Kirk Ruths, MD

## 2022-06-17 NOTE — Telephone Encounter (Signed)
Looks like patient was also seen in the ED yesterday. Please advise

## 2022-06-21 ENCOUNTER — Other Ambulatory Visit: Payer: Self-pay | Admitting: Family Medicine

## 2022-06-21 ENCOUNTER — Encounter: Payer: Self-pay | Admitting: Family Medicine

## 2022-06-21 ENCOUNTER — Encounter (INDEPENDENT_AMBULATORY_CARE_PROVIDER_SITE_OTHER): Payer: BC Managed Care – PPO | Admitting: Family Medicine

## 2022-06-21 DIAGNOSIS — M5412 Radiculopathy, cervical region: Secondary | ICD-10-CM | POA: Diagnosis not present

## 2022-06-21 MED ORDER — TRAMADOL HCL 50 MG PO TABS: 50.0000 mg | ORAL_TABLET | Freq: Three times a day (TID) | ORAL | 0 refills | Status: AC | PRN

## 2022-06-21 MED ORDER — MELOXICAM 15 MG PO TABS: 15.0000 mg | ORAL_TABLET | Freq: Every day | ORAL | 0 refills | Status: DC

## 2022-06-21 NOTE — Telephone Encounter (Signed)
Please see the MyChart message reply(ies) for my assessment and plan.  The patient gave consent for this Medical Advice Message and is aware that it may result in a bill to their insurance company as well as the possibility that this may result in a co-payment or deductible. They are an established patient, but are not seeking medical advice exclusively about a problem treated during an in person or video visit in the last 7 days. I did not recommend an in person or video visit within 7 days of my reply.  I spent a total of 6 minutes cumulative time within 7 days through MyChart messaging Terricka Onofrio Paul Kierra Jezewski, DO  

## 2022-06-26 ENCOUNTER — Ambulatory Visit: Payer: BC Managed Care – PPO | Admitting: Cardiology

## 2022-06-26 ENCOUNTER — Encounter: Payer: Self-pay | Admitting: Cardiology

## 2022-06-26 VITALS — BP 118/70 | HR 71 | Ht 69.75 in | Wt 243.1 lb

## 2022-06-26 DIAGNOSIS — I10 Essential (primary) hypertension: Secondary | ICD-10-CM | POA: Diagnosis not present

## 2022-06-26 DIAGNOSIS — E7800 Pure hypercholesterolemia, unspecified: Secondary | ICD-10-CM | POA: Diagnosis not present

## 2022-06-26 DIAGNOSIS — I420 Dilated cardiomyopathy: Secondary | ICD-10-CM

## 2022-06-26 DIAGNOSIS — R072 Precordial pain: Secondary | ICD-10-CM

## 2022-06-26 MED ORDER — METOPROLOL TARTRATE 100 MG PO TABS: ORAL_TABLET | ORAL | 0 refills | Status: DC

## 2022-06-26 NOTE — Patient Instructions (Addendum)
Testing/Procedures:  Your physician has requested that you have an echocardiogram. Echocardiography is a painless test that uses sound waves to create images of your heart. It provides your doctor with information about the size and shape of your heart and how well your heart's chambers and valves are working. This procedure takes approximately one hour. There are no restrictions for this procedure. [facility] 1ST FLOOR IMAGING DEPARTMENT    Your cardiac CT will be scheduled at   [facility] [place], [place] [address] [phone]    If scheduled at [facility], please arrive at the [facility] and Children's Entrance (Entrance C2) of [facility] 30 minutes prior to test start time. You can use the FREE valet parking offered at entrance C (encouraged to control the heart rate for the test)  Proceed to the [facility] Radiology Department (first floor) to check-in and test prep.  All radiology patients and guests should use entrance C2 at [facility], accessed from [facility], even though the hospital's physical address listed is [address].      Please follow these instructions carefully (unless otherwise directed):  Hold all erectile dysfunction medications at least 3 days (72 hrs) prior to test.  On the Night Before the Test: Be sure to Drink plenty of water. Do not consume any caffeinated/decaffeinated beverages or chocolate 12 hours prior to your test. Do not take any antihistamines 12 hours prior to your test.   On the Day of the Test: Drink plenty of water until 1 hour prior to the test. Do not eat any food 4 hours prior to the test. You may take your regular medications prior to the test.  DO NOT TAKE METOPROLOL SUCC ER THE MORNING OF THE CT SCAN -Take metoprolol (Lopressor) 100 MG two hours prior to test.       After the Test: Drink plenty of water. After receiving IV contrast, you  may experience a mild flushed feeling. This is normal. On occasion, you may experience a mild rash up to 24 hours after the test. This is not dangerous. If this occurs, you can take Benadryl 25 mg and increase your fluid intake. If you experience trouble breathing, this can be serious. If it is severe call 911 IMMEDIATELY. If it is mild, please call our office. If you take any of these medications: Glipizide/Metformin, Avandament, Glucavance, please do not take 48 hours after completing test unless otherwise instructed.  We will call to schedule your test 2-4 weeks out understanding that some insurance companies will need an authorization prior to the service being performed.   For non-scheduling related questions, please contact the cardiac imaging nurse navigator should you have any questions/concerns: Marchia Bond, Cardiac Imaging Nurse Navigator Gordy Clement, Cardiac Imaging Nurse Navigator [facility] Direct Office Dial: [phone]   For scheduling needs, including cancellations and rescheduling, please call [place], [phone].    Follow-Up: At [facility], you and your health needs are our priority.  As part of our continuing mission to provide you with exceptional heart care, we have created designated Provider Care Teams.  These Care Teams include your primary Cardiologist (physician) and Advanced Practice Providers (APPs -  Physician Assistants and Nurse Practitioners) who all work together to provide you with the care you need, when you need it.  We recommend signing up for the patient portal called "MyChart".  Sign up information is provided on this After Visit  Summary.  MyChart is used to connect with patients for Virtual Visits (Telemedicine).  Patients are able to view lab/test results, encounter notes, upcoming appointments, etc.  Non-urgent messages can be sent to your provider as well.   To learn more about what you can do with MyChart, go to  [email or web address].    Your next appointment:   8-12 week(s)  The format for your next appointment:   In Person  Provider:   Kirk Ruths, MD      Important Information About Sugar

## 2022-07-01 ENCOUNTER — Other Ambulatory Visit: Payer: Self-pay | Admitting: Family Medicine

## 2022-07-01 ENCOUNTER — Telehealth: Payer: Self-pay | Admitting: Family Medicine

## 2022-07-01 NOTE — Telephone Encounter (Signed)
Pt stated he needs pcp to give him pain meds. Stated he was seen at the ED for a pinched nerve and they gave him medication with no refills. He stated when he followed up with his neuro dr they advised him ED should have given refills, and they were reluctant to give any medications until mri results come back. Advised pt he may need an appt with pcp to discuss this but he stated that was unnecessary because we should already know what's gong on. Please advise.

## 2022-07-02 ENCOUNTER — Other Ambulatory Visit: Payer: Self-pay | Admitting: Family Medicine

## 2022-07-02 MED ORDER — GABAPENTIN 100 MG PO CAPS
100.0000 mg | ORAL_CAPSULE | Freq: Three times a day (TID) | ORAL | 1 refills | Status: DC
Start: 2022-07-02 — End: 2022-10-04

## 2022-07-03 ENCOUNTER — Ambulatory Visit (HOSPITAL_BASED_OUTPATIENT_CLINIC_OR_DEPARTMENT_OTHER)
Admission: RE | Admit: 2022-07-03 | Discharge: 2022-07-03 | Disposition: A | Discharge status: Home / Self Care | Payer: BC Managed Care – PPO | Source: Ambulatory Visit | Attending: Cardiology | Admitting: Cardiology

## 2022-07-03 DIAGNOSIS — I420 Dilated cardiomyopathy: Secondary | ICD-10-CM | POA: Insufficient documentation

## 2022-07-03 LAB — ECHOCARDIOGRAM COMPLETE
AR max vel: 2.11 cm2
AV Area VTI: 2.07 cm2
AV Area mean vel: 1.99 cm2
AV Mean grad: 3 mmHg
AV Peak grad: 5.3 mmHg
Ao pk vel: 1.15 m/s
Area-P 1/2: 3.06 cm2
Calc EF: 31.9 %
S' Lateral: 5.3 cm
Single Plane A2C EF: 33.3 %
Single Plane A4C EF: 32.5 %

## 2022-07-03 NOTE — Progress Notes (Signed)
  Echocardiogram 2D Echocardiogram has been performed.  Alfred Martin F 07/03/2022, 5:03 PM

## 2022-07-03 NOTE — Telephone Encounter (Signed)
Pt made aware. Pt states pain has improved and states using a tens machine that helped loosen the nerve. Pt states he will pick meds and schedule appt if pain comes back.

## 2022-07-04 ENCOUNTER — Telehealth: Payer: Self-pay | Admitting: *Deleted

## 2022-07-04 DIAGNOSIS — I5022 Chronic systolic (congestive) heart failure: Secondary | ICD-10-CM

## 2022-07-04 MED ORDER — DAPAGLIFLOZIN PROPANEDIOL 10 MG PO TABS: 10.0000 mg | ORAL_TABLET | Freq: Every day | ORAL | 11 refills | Status: AC

## 2022-07-04 MED ORDER — SPIRONOLACTONE 25 MG PO TABS: 12.5000 mg | ORAL_TABLET | Freq: Every day | ORAL | 3 refills | Status: AC

## 2022-07-04 MED ORDER — SACUBITRIL-VALSARTAN 24-26 MG PO TABS: 1.0000 | ORAL_TABLET | Freq: Two times a day (BID) | ORAL | 11 refills | Status: DC

## 2022-07-04 NOTE — Telephone Encounter (Signed)
-----   Message from Lelon Perla, MD sent at 07/04/2022  7:33 AM EDT ----- LV function remains decreased; DC lotrel; in 48 hours begin entresto 24/26 BID, farxiga 10 mg daily and spironolactone 12.5 mg daily; bmet one week later; await results of cardiac CTA Kirk Ruths

## 2022-07-04 NOTE — Telephone Encounter (Signed)
Spoke with pt, New script sent to the pharmacy  Lab orders mailed to the pt

## 2022-07-05 ENCOUNTER — Other Ambulatory Visit (INDEPENDENT_AMBULATORY_CARE_PROVIDER_SITE_OTHER): Payer: BC Managed Care – PPO

## 2022-07-05 DIAGNOSIS — E119 Type 2 diabetes mellitus without complications: Secondary | ICD-10-CM | POA: Diagnosis not present

## 2022-07-05 DIAGNOSIS — E785 Hyperlipidemia, unspecified: Secondary | ICD-10-CM | POA: Diagnosis not present

## 2022-07-05 LAB — COMPREHENSIVE METABOLIC PANEL
ALT: 32 U/L (ref 0–53)
AST: 20 U/L (ref 0–37)
Albumin: 4.1 g/dL (ref 3.5–5.2)
Alkaline Phosphatase: 34 U/L — ABNORMAL LOW (ref 39–117)
BUN: 21 mg/dL (ref 6–23)
CO2: 26 mEq/L (ref 19–32)
Calcium: 9.4 mg/dL (ref 8.4–10.5)
Chloride: 102 mEq/L (ref 96–112)
Creatinine, Ser: 1.25 mg/dL (ref 0.40–1.50)
GFR: 63.95 mL/min (ref >60.00)
Glucose, Bld: 122 mg/dL — ABNORMAL HIGH (ref 70–99)
Potassium: 4 mEq/L (ref 3.5–5.1)
Sodium: 137 mEq/L (ref 135–145)
Total Bilirubin: 0.5 mg/dL (ref 0.2–1.2)
Total Protein: 6.8 g/dL (ref 6.0–8.3)

## 2022-07-05 LAB — LIPID PANEL
Cholesterol: 222 mg/dL — ABNORMAL HIGH (ref 0–200)
HDL: 47.8 mg/dL (ref >39.00)
LDL Cholesterol: 144 mg/dL — ABNORMAL HIGH (ref 0–99)
NonHDL: 174.69
Total CHOL/HDL Ratio: 5
Triglycerides: 153 mg/dL — ABNORMAL HIGH (ref 0.0–149.0)
VLDL: 30.6 mg/dL (ref 0.0–40.0)

## 2022-07-05 LAB — HEMOGLOBIN A1C: Hgb A1c MFr Bld: 7.5 % — ABNORMAL HIGH (ref 4.6–6.5)

## 2022-07-10 ENCOUNTER — Other Ambulatory Visit: Payer: Self-pay | Admitting: Family Medicine

## 2022-07-10 ENCOUNTER — Encounter: Payer: Self-pay | Admitting: Family Medicine

## 2022-07-10 DIAGNOSIS — E119 Type 2 diabetes mellitus without complications: Secondary | ICD-10-CM

## 2022-07-10 DIAGNOSIS — I10 Essential (primary) hypertension: Secondary | ICD-10-CM

## 2022-07-10 DIAGNOSIS — E785 Hyperlipidemia, unspecified: Secondary | ICD-10-CM

## 2022-07-11 ENCOUNTER — Telehealth (HOSPITAL_COMMUNITY): Payer: Self-pay | Admitting: *Deleted

## 2022-07-11 NOTE — Telephone Encounter (Signed)
Reaching out to patient to offer assistance regarding upcoming cardiac imaging study; pt verbalizes understanding of appt date/time, parking situation and where to check in, pre-test NPO status and medications ordered, and verified current allergies; name and call back number provided for further questions should they arise  Alfred Hopkin RN Navigator Cardiac Imaging [facility] [phone] office [phone] cell  Patient to take 100mg metoprolol tartrate two hours prior to his cardiac CT scan. He is aware to arrive at 2pm. 

## 2022-07-12 ENCOUNTER — Other Ambulatory Visit: Payer: Self-pay | Admitting: Cardiology

## 2022-07-12 ENCOUNTER — Ambulatory Visit (HOSPITAL_COMMUNITY)
Admission: RE | Admit: 2022-07-12 | Discharge: 2022-07-12 | Disposition: A | Discharge status: Home / Self Care | Payer: BC Managed Care – PPO | Source: Ambulatory Visit | Attending: Cardiology | Admitting: Cardiology

## 2022-07-12 DIAGNOSIS — R072 Precordial pain: Secondary | ICD-10-CM

## 2022-07-12 MED ORDER — METOPROLOL TARTRATE 5 MG/5ML IV SOLN
INTRAVENOUS | Status: AC
  Filled 2022-07-12: qty 10

## 2022-07-12 MED ORDER — NITROGLYCERIN 0.4 MG SL SUBL
0.8000 mg | SUBLINGUAL_TABLET | Freq: Once | SUBLINGUAL | Status: AC
  Administered 2022-07-12: 0.8 mg via SUBLINGUAL

## 2022-07-12 MED ORDER — NITROGLYCERIN 0.4 MG SL SUBL
SUBLINGUAL_TABLET | SUBLINGUAL | Status: AC
  Filled 2022-07-12: qty 2

## 2022-07-12 MED ORDER — METOPROLOL TARTRATE 5 MG/5ML IV SOLN
5.0000 mg | INTRAVENOUS | Status: DC | PRN
  Administered 2022-07-12: 5 mg via INTRAVENOUS

## 2022-07-18 ENCOUNTER — Other Ambulatory Visit: Payer: Self-pay | Admitting: Family Medicine

## 2022-07-18 DIAGNOSIS — M5412 Radiculopathy, cervical region: Secondary | ICD-10-CM

## 2022-07-21 ENCOUNTER — Encounter: Payer: Self-pay | Admitting: Internal Medicine

## 2022-07-29 ENCOUNTER — Ambulatory Visit: Payer: BC Managed Care – PPO | Admitting: Internal Medicine

## 2022-07-30 ENCOUNTER — Other Ambulatory Visit: Payer: Self-pay | Admitting: Cardiology

## 2022-07-30 DIAGNOSIS — R072 Precordial pain: Secondary | ICD-10-CM

## 2022-08-02 ENCOUNTER — Telehealth: Payer: Self-pay | Admitting: Cardiology

## 2022-08-02 ENCOUNTER — Encounter: Payer: Self-pay | Admitting: Cardiology

## 2022-08-02 DIAGNOSIS — R931 Abnormal findings on diagnostic imaging of heart and coronary circulation: Secondary | ICD-10-CM

## 2022-08-02 MED ORDER — ASPIRIN 81 MG PO TBEC: 81.0000 mg | DELAYED_RELEASE_TABLET | Freq: Every day | ORAL | 3 refills | Status: AC

## 2022-08-02 NOTE — Telephone Encounter (Signed)
Pt calling back. Transferred to [place], Jenna B.

## 2022-08-02 NOTE — Telephone Encounter (Signed)
Alfred Perla, MD  08/02/2022  3:02 PM EDT     Add ASA 81 mg daily; Ca score elevated; this should have been a cardiac CTA to R/O CAD; please reschedule. [place] with patient regarding the following results. Patient made aware and patient verbalized understanding.   Patient states that he was not able to do the CTA due to being claustrophobic and his HR being too high. Patient states that despite medication he gets too worked up and HR remains too elevated. Patient would like to know if there is another recommendation for a test that he can do instead. Advised patient I would forward message to Dr. Stanford Breed for him to review and advise. Patient verbalized understanding.   Aspirin '81mg'$  added to patients medication list.

## 2022-08-02 NOTE — Telephone Encounter (Signed)
Attempted to call patient, left message to call back. Will send MyChart message with Dr. Jacalyn Lefevre recommendations regarding results.

## 2022-08-06 ENCOUNTER — Encounter (HOSPITAL_COMMUNITY): Payer: BC Managed Care – PPO

## 2022-08-06 ENCOUNTER — Encounter: Payer: Self-pay | Admitting: *Deleted

## 2022-08-06 ENCOUNTER — Other Ambulatory Visit: Payer: Self-pay | Admitting: Family Medicine

## 2022-08-06 NOTE — Telephone Encounter (Signed)
Left message for pt to call.

## 2022-09-18 ENCOUNTER — Other Ambulatory Visit: Payer: Self-pay | Admitting: Family Medicine

## 2022-09-18 DIAGNOSIS — I10 Essential (primary) hypertension: Secondary | ICD-10-CM

## 2022-09-18 NOTE — Progress Notes (Deleted)
HPI: FU CHF. Previously followed by Dr. Harriet Masson.  Long h/o CM. Cardiac catheterization in the past showed no significant coronary disease by report but no records available. Echocardiogram June 2022 showed ejection fraction 25 to 30%, moderate left ventricular enlargement, mild mitral regurgitation.  Note LV function similar to July 2016.  Monitor June 2022 showed sinus rhythm with occasional PACs, PVCs (3.2%). Follow-up echocardiogram August 2023 showed ejection fraction 30 to 03%, grade 1 diastolic dysfunction.  Calcium score September 2023 765 which was 99th percentile.  Cardiac CTA was planned but heart rate remained too elevated and patient complained of claustrophobia.  Nuclear study has been ordered.  Since last seen  Current Outpatient Medications  Medication Sig Dispense Refill   ACCU-CHEK GUIDE test strip USE AS DIRECTED UP TO 4 TIMES A DAY TO CHECK BLOOD SUGAR 100 strip 1   Accu-Chek Softclix Lancets lancets USE TO CHECK GLUCOSE THREE TIMES DAILY 100 each 1   allopurinol (ZYLOPRIM) 300 MG tablet TAKE 1 TABLET BY MOUTH EVERY DAY 90 tablet 2   aspirin EC 81 MG tablet Take 1 tablet (81 mg total) by mouth daily. Swallow whole. 90 tablet 3   azelastine (ASTELIN) 0.1 % nasal spray Place 1 spray into both nostrils 2 (two) times daily. Use in each nostril as directed 30 mL 12   dapagliflozin propanediol (FARXIGA) 10 MG TABS tablet Take 1 tablet (10 mg total) by mouth daily before breakfast. 30 tablet 11   ENTRESTO 24-26 MG Take 1 tablet by mouth 2 (two) times daily.     ezetimibe (ZETIA) 10 MG tablet Take 1 tablet (10 mg total) by mouth daily. 90 tablet 1   fluticasone (FLONASE) 50 MCG/ACT nasal spray SPRAY 2 SPRAYS INTO EACH NOSTRIL EVERY DAY 48 mL 1   gabapentin (NEURONTIN) 100 MG capsule Take 1 capsule (100 mg total) by mouth 3 (three) times daily. 90 capsule 1   hydrochlorothiazide (HYDRODIURIL) 25 MG tablet TAKE 1 TABLET BY MOUTH EVERY DAY 90 tablet 1   hydroquinone 4 % cream APPLY TO  AFFECTED AREA TWICE A DAY 28.35 g 1   levocetirizine (XYZAL) 5 MG tablet TAKE 1 TABLET BY MOUTH EVERY DAY IN THE EVENING 90 tablet 1   meloxicam (MOBIC) 15 MG tablet TAKE 1 TABLET (15 MG TOTAL) BY MOUTH DAILY. 30 tablet 0   methocarbamol (ROBAXIN) 500 MG tablet Take 1 tablet (500 mg total) by mouth every 8 (eight) hours as needed for muscle spasms. 30 tablet 0   metoprolol succinate (TOPROL-XL) 25 MG 24 hr tablet TAKE 1 TABLET BY MOUTH EVERY DAY 90 tablet 1   metoprolol tartrate (LOPRESSOR) 100 MG tablet TAKE 2 HOURS PRIOR TO CT SCAN 1 tablet 0   rosuvastatin (CRESTOR) 40 MG tablet TAKE 1 TABLET BY MOUTH EVERY DAY 90 tablet 1   sacubitril-valsartan (ENTRESTO) 24-26 MG Take 1 tablet by mouth 2 (two) times daily. 60 tablet 11   spironolactone (ALDACTONE) 25 MG tablet Take 0.5 tablets (12.5 mg total) by mouth daily. 45 tablet 3   No current facility-administered medications for this visit.     Past Medical History:  Diagnosis Date   Abnormal echocardiogram 07/27/2015   Acute renal failure superimposed on stage 2 chronic kidney disease ([place]) 05/16/2021   Allergy    Cardiomyopathy ([place]) 07/27/2015   Overview:  Dilated.   Chronic HFrEF (heart failure with reduced ejection fraction) ([facility]) 05/17/2021   Class 2 severe obesity with serious comorbidity and body mass index (BMI) of 35.0  to 35.9 in adult [facility]) 05/23/2018   DM2 (diabetes mellitus, type 2) ([place]) 05/17/2021   Dyspnea on exertion 06/20/2015   6 Minute Walk Test 06/20/15- 99% at rest, 97% at end of walk, 99% after 2 minutes rest. POatient walked 528 meters without stopping or incident. No oxgenation limitation on this test.   Gout of multiple sites 11/21/2017   Guaiac positive stools 04/03/2021   Hemorrhoids 04/03/2021   HTN (hypertension)    Hyperlipidemia LDL goal <100 08/12/2017   Joint pain    Joint swelling 08/25/2015   Obesity (BMI 30-39.9) 07/04/2021   Olecranon bursitis, left elbow 07/29/2018   Preventative health care  06/01/2015   Radiculopathy 05/17/2021   Urinary frequency 04/03/2021    Past Surgical History:  Procedure Laterality Date   COLONOSCOPY WITH PROPOFOL N/A 09/12/2021   Procedure: COLONOSCOPY WITH PROPOFOL;  Surgeon: Lavena Bullion, DO;  [facility];  Service: Gastroenterology;  Laterality: N/A;   POLYPECTOMY  09/12/2021   Procedure: POLYPECTOMY;  Surgeon: Lavena Bullion, DO;  [facility];  Service: Gastroenterology;;   SUBMUCOSAL TATTOO INJECTION  09/12/2021   Procedure: SUBMUCOSAL TATTOO INJECTION;  Surgeon: Lavena Bullion, DO;  [facility];  Service: Gastroenterology;;   TONSILLECTOMY      Social History   Socioeconomic History   Marital status: Married    Spouse name: [name]   Number of children: 4   Years of education: Not on file   Highest education level: Not on file  Occupational History   Occupation: high school english teacer    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, prison and probation services  Tobacco Use   Smoking status: Never   Smokeless tobacco: Never  Vaping Use   Vaping Use: Never used  Substance and Sexual Activity   Alcohol use: No    Alcohol/week: 0.0 standard drinks of alcohol   Drug use: No   Sexual activity: Yes  Other Topics Concern   Not on file  Social History Narrative   Exercise-- jogging 3-4 days a week   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   Social Determinants of Radio broadcast assistant Strain: Not on file  Food Insecurity: Not on file  Transportation Needs: Not on file  Physical Activity: Not on file  Stress: Not on file  Social Connections: Not on file  Intimate Partner Violence: Not on file    Family History  Problem Relation Age of Onset   Arthritis Mother        Severe-- RA   Diabetes Mother    Hyperlipidemia Mother    High blood pressure Mother    Hypertension Mother    Obesity Mother    Diabetes Father    Hyperlipidemia Father    High blood pressure Father    Hypertension Father     Liver disease Father    Diabetes Brother    Hypertension Brother    HIV/AIDS Brother    Hypertension Brother    Drug abuse Brother        chf-- crack / cocaine   Congestive Heart Failure Brother    Heart disease Maternal Grandmother    Breast cancer Paternal Grandmother    Breast cancer Maternal Aunt    Arthritis Maternal Aunt        RA   Heart failure Maternal Aunt    Arthritis Maternal Aunt    Arthritis Maternal Aunt    Colon cancer Paternal Uncle    Congestive Heart Failure Paternal Uncle  Stroke Paternal Uncle    Colon polyps Neg Hx    Rectal cancer Neg Hx    Stomach cancer Neg Hx     ROS: no fevers or chills, productive cough, hemoptysis, dysphasia, odynophagia, melena, hematochezia, dysuria, hematuria, rash, seizure activity, orthopnea, PND, pedal edema, claudication. Remaining systems are negative.  Physical Exam: Well-developed well-nourished in no acute distress.  Skin is warm and dry.  HEENT is normal.  Neck is supple.  Chest is clear to auscultation with normal expansion.  Cardiovascular exam is regular rate and rhythm.  Abdominal exam nontender or distended. No masses palpated. Extremities show no edema. neuro grossly intact  ECG- personally reviewed  A/P  1 cardiomyopathy-etiology of cardiomyopathy remains unclear.  No history of alcohol abuse.  Apparently patient had a remote catheterization that showed no coronary disease.  Recent calcium score elevated.  Cardiac CTA could not be performed due to elevated heart rate and patient complaining of claustrophobia.  Nuclear study has been ordered to screen for ischemia.  However given reduced LV function I feel definitive evaluation is appropriate.  We will arrange cardiac catheterization.  The risk and benefits including myocardial infarction, CVA and death discussed and he agrees to proceed.  Continue Entresto, Toprol.  2 chronic systolic congestive heart failure-patient is euvolemic on examination.  Continue  present dose of HCTZ, spironolactone and Farxiga.  3 hypertension-  4 hyperlipidemia-continue Crestor and Zetia.  Kirk Ruths, MD

## 2022-09-20 ENCOUNTER — Telehealth (HOSPITAL_COMMUNITY): Payer: Self-pay | Admitting: *Deleted

## 2022-09-20 NOTE — Telephone Encounter (Signed)
Patient given detailed instructions per Myocardial Perfusion Study Information Sheet for the test on 09/27/2022 at 7:45. Patient notified to arrive 15 minutes early and that it is imperative to arrive on time for appointment to keep from having the test rescheduled.  If you need to cancel or reschedule your appointment, please call the office within 24 hours of your appointment. . Patient verbalized understanding.Alfred Martin

## 2022-09-25 ENCOUNTER — Other Ambulatory Visit: Payer: Self-pay | Admitting: Family Medicine

## 2022-09-25 DIAGNOSIS — E785 Hyperlipidemia, unspecified: Secondary | ICD-10-CM

## 2022-09-27 ENCOUNTER — Encounter (HOSPITAL_COMMUNITY): Payer: BC Managed Care – PPO

## 2022-09-27 ENCOUNTER — Encounter (HOSPITAL_COMMUNITY): Payer: Self-pay

## 2022-10-01 ENCOUNTER — Encounter: Payer: Self-pay | Admitting: Gastroenterology

## 2022-10-02 ENCOUNTER — Other Ambulatory Visit: Payer: Self-pay | Admitting: Family Medicine

## 2022-10-02 ENCOUNTER — Ambulatory Visit: Payer: BC Managed Care – PPO | Admitting: Cardiology

## 2022-10-04 ENCOUNTER — Encounter: Payer: Self-pay | Admitting: Family Medicine

## 2022-10-04 ENCOUNTER — Ambulatory Visit (INDEPENDENT_AMBULATORY_CARE_PROVIDER_SITE_OTHER): Payer: BC Managed Care – PPO | Admitting: Family Medicine

## 2022-10-04 VITALS — BP 110/70 | HR 66 | Temp 98.3°F | Resp 18 | Ht 69.75 in | Wt 240.4 lb

## 2022-10-04 DIAGNOSIS — E785 Hyperlipidemia, unspecified: Secondary | ICD-10-CM

## 2022-10-04 DIAGNOSIS — I5022 Chronic systolic (congestive) heart failure: Secondary | ICD-10-CM

## 2022-10-04 DIAGNOSIS — E119 Type 2 diabetes mellitus without complications: Secondary | ICD-10-CM

## 2022-10-04 DIAGNOSIS — L819 Disorder of pigmentation, unspecified: Secondary | ICD-10-CM

## 2022-10-04 DIAGNOSIS — I10 Essential (primary) hypertension: Secondary | ICD-10-CM

## 2022-10-04 DIAGNOSIS — E1165 Type 2 diabetes mellitus with hyperglycemia: Secondary | ICD-10-CM

## 2022-10-04 LAB — CBC WITH DIFFERENTIAL/PLATELET
Basophils Absolute: 0.1 10*3/uL (ref 0.0–0.1)
Basophils Relative: 2 % (ref 0.0–3.0)
Eosinophils Absolute: 0.2 10*3/uL (ref 0.0–0.7)
Eosinophils Relative: 4.3 % (ref 0.0–5.0)
HCT: 43.8 % (ref 39.0–52.0)
Hemoglobin: 14 g/dL (ref 13.0–17.0)
Lymphocytes Relative: 50.8 % — ABNORMAL HIGH (ref 12.0–46.0)
Lymphs Abs: 2.4 10*3/uL (ref 0.7–4.0)
MCHC: 31.9 g/dL (ref 30.0–36.0)
MCV: 85.8 fl (ref 78.0–100.0)
Monocytes Absolute: 0.6 10*3/uL (ref 0.1–1.0)
Monocytes Relative: 12.4 % — ABNORMAL HIGH (ref 3.0–12.0)
Neutro Abs: 1.4 10*3/uL (ref 1.4–7.7)
Neutrophils Relative %: 30.5 % — ABNORMAL LOW (ref 43.0–77.0)
Platelets: 169 10*3/uL (ref 150.0–400.0)
RBC: 5.1 Mil/uL (ref 4.22–5.81)
RDW: 14.8 % (ref 11.5–15.5)
WBC: 4.7 10*3/uL (ref 4.0–10.5)

## 2022-10-04 LAB — LIPID PANEL
Cholesterol: 176 mg/dL (ref 0–200)
HDL: 44.6 mg/dL (ref >39.00)
LDL Cholesterol: 119 mg/dL — ABNORMAL HIGH (ref 0–99)
NonHDL: 131.52
Total CHOL/HDL Ratio: 4
Triglycerides: 65 mg/dL (ref 0.0–149.0)
VLDL: 13 mg/dL (ref 0.0–40.0)

## 2022-10-04 LAB — HEMOGLOBIN A1C: Hgb A1c MFr Bld: 6.4 % (ref 4.6–6.5)

## 2022-10-04 LAB — COMPREHENSIVE METABOLIC PANEL
ALT: 22 U/L (ref 0–53)
AST: 21 U/L (ref 0–37)
Albumin: 4.3 g/dL (ref 3.5–5.2)
Alkaline Phosphatase: 31 U/L — ABNORMAL LOW (ref 39–117)
BUN: 21 mg/dL (ref 6–23)
CO2: 30 mEq/L (ref 19–32)
Calcium: 9.8 mg/dL (ref 8.4–10.5)
Chloride: 101 mEq/L (ref 96–112)
Creatinine, Ser: 1.14 mg/dL (ref 0.40–1.50)
GFR: 71.3 mL/min (ref >60.00)
Glucose, Bld: 97 mg/dL (ref 70–99)
Potassium: 3.9 mEq/L (ref 3.5–5.1)
Sodium: 138 mEq/L (ref 135–145)
Total Bilirubin: 0.6 mg/dL (ref 0.2–1.2)
Total Protein: 6.9 g/dL (ref 6.0–8.3)

## 2022-10-04 LAB — MICROALBUMIN / CREATININE URINE RATIO
Creatinine,U: 130.7 mg/dL
Microalb Creat Ratio: 0.5 mg/g (ref 0.0–30.0)
Microalb, Ur: <0.7 mg/dL (ref 0.0–1.9)

## 2022-10-04 MED ORDER — HYDROQUINONE 4 % EX CREA: TOPICAL_CREAM | CUTANEOUS | 1 refills | Status: DC

## 2022-10-04 NOTE — Assessment & Plan Note (Signed)
Encourage heart healthy diet such as MIND or DASH diet, increase exercise, avoid trans fats, simple carbohydrates and processed foods, consider a krill or fish or flaxseed oil cap daily.  °

## 2022-10-04 NOTE — Assessment & Plan Note (Signed)
Per cardiology 

## 2022-10-04 NOTE — Assessment & Plan Note (Signed)
hgba1c to be checked, minimize simple carbs. Increase exercise as tolerated. Continue current meds  

## 2022-10-04 NOTE — Assessment & Plan Note (Signed)
Well controlled, no changes to meds. Encouraged heart healthy diet such as the DASH diet and exercise as tolerated.  °

## 2022-10-04 NOTE — Progress Notes (Signed)
Subjective:   By signing my name below, I, Alfred Martin, attest that this documentation has been prepared under the direction and in the presence of Alfred Martin, 10/04/2022.   Patient ID: Alfred Martin, male    DOB: [birth year], 56 y.o.   MRN: [id number]  Chief Complaint  Patient presents with   Hypertension   Hyperlipidemia   Diabetes   Follow-up    HPI Patient is in today for an office visit.  Pigmentation Patient reports that he needs a refill on hydroquinone 4% cream.  Diabetes He reports that he regularly checks his blood sugar at home. When he is fasting it runs around 79 and other times 114. He denies any swelling in ankles/feet and tingling/numbness in feet.  Hyperlipidemia Patient is compliant with his 40 mg Crestor medication.  Hypertension Patient is complaint with his 25 mg Metoprolol Succinate.  Immunizations Patient is uninterested in receiving an influenza vaccine this visit.  Endocrinology  He reports that he regularly sees his endocrinologist, Dr. Kelton Martin.  Health Maintenance Due  Topic Date Due   Zoster Vaccines- Shingrix (1 of 2) Never done   OPHTHALMOLOGY EXAM  08/29/2022   Diabetic kidney evaluation - Urine ACR  10/04/2022    Past Medical History:  Diagnosis Date   Abnormal echocardiogram 07/27/2015   Acute renal failure superimposed on stage 2 chronic kidney disease ([place]) 05/16/2021   Allergy    Cardiomyopathy ([place]) 07/27/2015   Overview:  Dilated.   Chronic HFrEF (heart failure with reduced ejection fraction) ([place]) 05/17/2021   Class 2 severe obesity with serious comorbidity and body mass index (BMI) of 35.0 to 35.9 in adult [facility]) 05/23/2018   DM2 (diabetes mellitus, type 2) ([place]) 05/17/2021   Dyspnea on exertion 06/20/2015   6 Minute Walk Test 06/20/15- 99% at rest, 97% at end of walk, 99% after 2 minutes rest. POatient walked 528 meters without stopping or incident. No oxgenation limitation on this test.   Gout of multiple  sites 11/21/2017   Guaiac positive stools 04/03/2021   Hemorrhoids 04/03/2021   HTN (hypertension)    Hyperlipidemia LDL goal <100 08/12/2017   Joint pain    Joint swelling 08/25/2015   Obesity (BMI 30-39.9) 07/04/2021   Olecranon bursitis, left elbow 07/29/2018   Preventative health care 06/01/2015   Radiculopathy 05/17/2021   Urinary frequency 04/03/2021    Past Surgical History:  Procedure Laterality Date   COLONOSCOPY WITH PROPOFOL N/A 09/12/2021   Procedure: COLONOSCOPY WITH PROPOFOL;  Surgeon: Alfred Bullion, DO;  Location: [facility];  Service: Gastroenterology;  Laterality: N/A;   POLYPECTOMY  09/12/2021   Procedure: POLYPECTOMY;  Surgeon: Alfred Bullion, DO;  Location: [facility];  Service: Gastroenterology;;   SUBMUCOSAL TATTOO INJECTION  09/12/2021   Procedure: SUBMUCOSAL TATTOO INJECTION;  Surgeon: Alfred Bullion, DO;  Location: [facility];  Service: Gastroenterology;;   TONSILLECTOMY      Family History  Problem Relation Age of Onset   Arthritis Mother        Severe-- RA   Diabetes Mother    Hyperlipidemia Mother    High blood pressure Mother    Hypertension Mother    Obesity Mother    Diabetes Father    Hyperlipidemia Father    High blood pressure Father    Hypertension Father    Liver disease Father    Diabetes Brother    Hypertension Brother    HIV/AIDS Brother    Hypertension Brother    Drug abuse Brother  chf-- crack / cocaine   Congestive Heart Failure Brother    Heart disease Maternal Grandmother    Breast cancer Paternal Grandmother    Breast cancer Maternal Aunt    Arthritis Maternal Aunt        RA   Heart failure Maternal Aunt    Arthritis Maternal Aunt    Arthritis Maternal Aunt    Colon cancer Paternal Uncle    Congestive Heart Failure Paternal Uncle    Stroke Paternal Uncle    Colon polyps Neg Hx    Rectal cancer Neg Hx    Stomach cancer Neg Hx     Social History   Socioeconomic History   Marital  status: Married    Spouse name: Alfred Martin   Number of children: 4   Years of education: Not on file   Highest education level: Not on file  Occupational History   Occupation: high school english teacer    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, prison and probation services  Tobacco Use   Smoking status: Never   Smokeless tobacco: Never  Vaping Use   Vaping Use: Never used  Substance and Sexual Activity   Alcohol use: No    Alcohol/week: 0.0 standard drinks of alcohol   Drug use: No   Sexual activity: Yes  Other Topics Concern   Not on file  Social History Narrative   Exercise-- jogging 3-4 days a week   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   Social Determinants of Radio broadcast assistant Strain: Not on file  Food Insecurity: Not on file  Transportation Needs: Not on file  Physical Activity: Not on file  Stress: Not on file  Social Connections: Not on file  Intimate Partner Violence: Not on file    Outpatient Medications Prior to Visit  Medication Sig Dispense Refill   ACCU-CHEK GUIDE test strip USE AS DIRECTED UP TO 4 TIMES A DAY TO CHECK BLOOD SUGAR 100 strip 1   Accu-Chek Softclix Lancets lancets USE TO CHECK GLUCOSE THREE TIMES DAILY 100 each 1   allopurinol (ZYLOPRIM) 300 MG tablet TAKE 1 TABLET BY MOUTH EVERY DAY 90 tablet 2   aspirin EC 81 MG tablet Take 1 tablet (81 mg total) by mouth daily. Swallow whole. 90 tablet 3   azelastine (ASTELIN) 0.1 % nasal spray Place 1 spray into both nostrils 2 (two) times daily. Use in each nostril as directed 30 mL 12   dapagliflozin propanediol (FARXIGA) 10 MG TABS tablet Take 1 tablet (10 mg total) by mouth daily before breakfast. 30 tablet 11   ENTRESTO 24-26 MG Take 1 tablet by mouth 2 (two) times daily.     ezetimibe (ZETIA) 10 MG tablet TAKE 1 TABLET BY MOUTH EVERY DAY 90 tablet 1   fluticasone (FLONASE) 50 MCG/ACT nasal spray SPRAY 2 SPRAYS INTO EACH NOSTRIL EVERY DAY 48 mL 1   hydrochlorothiazide (HYDRODIURIL) 25 MG tablet Take 1 tablet (25  mg total) by mouth daily. 90 tablet 0   levocetirizine (XYZAL) 5 MG tablet TAKE 1 TABLET BY MOUTH EVERY DAY IN THE EVENING 90 tablet 1   methocarbamol (ROBAXIN) 500 MG tablet Take 1 tablet (500 mg total) by mouth every 8 (eight) hours as needed for muscle spasms. 30 tablet 0   metoprolol succinate (TOPROL-XL) 25 MG 24 hr tablet TAKE 1 TABLET BY MOUTH EVERY DAY 90 tablet 1   metoprolol tartrate (LOPRESSOR) 100 MG tablet TAKE 2 HOURS PRIOR TO CT SCAN 1 tablet 0   rosuvastatin (  CRESTOR) 40 MG tablet Take 1 tablet (40 mg total) by mouth daily. 90 tablet 0   sacubitril-valsartan (ENTRESTO) 24-26 MG Take 1 tablet by mouth 2 (two) times daily. 60 tablet 11   spironolactone (ALDACTONE) 25 MG tablet Take 0.5 tablets (12.5 mg total) by mouth daily. 45 tablet 3   hydroquinone 4 % cream APPLY TO AFFECTED AREA TWICE A DAY 28.35 g 1   gabapentin (NEURONTIN) 100 MG capsule Take 1 capsule (100 mg total) by mouth 3 (three) times daily. 90 capsule 1   meloxicam (MOBIC) 15 MG tablet TAKE 1 TABLET (15 MG TOTAL) BY MOUTH DAILY. 30 tablet 0   No facility-administered medications prior to visit.    No Known Allergies  Review of Systems  Constitutional:  Negative for chills, fever and malaise/fatigue.  HENT:  Negative for congestion and hearing loss.   Eyes:  Negative for discharge.  Respiratory:  Negative for cough, sputum production and shortness of breath.   Cardiovascular:  Negative for chest pain, palpitations and leg swelling.  Gastrointestinal:  Negative for abdominal pain, blood in stool, constipation, diarrhea, heartburn, nausea and vomiting.  Genitourinary:  Negative for dysuria, frequency, hematuria and urgency.  Musculoskeletal:  Negative for back pain, falls and myalgias.  Skin:  Negative for rash.  Neurological:  Negative for dizziness, sensory change, loss of consciousness, weakness and headaches.  Endo/Heme/Allergies:  Negative for environmental allergies. Does not bruise/bleed easily.   Psychiatric/Behavioral:  Negative for depression and suicidal ideas. The patient is not nervous/anxious and does not have insomnia.        Objective:    Physical Exam Vitals and nursing note reviewed.  Constitutional:      General: He is not in acute distress.    Appearance: Normal appearance. He is well-developed. He is not ill-appearing.  HENT:     Head: Normocephalic and atraumatic.     Right Ear: External ear normal.     Left Ear: External ear normal.  Eyes:     Extraocular Movements: Extraocular movements intact.     Pupils: Pupils are equal, round, and reactive to light.  Neck:     Thyroid: No thyromegaly.  Cardiovascular:     Rate and Rhythm: Normal rate and regular rhythm.     Heart sounds: Normal heart sounds. No murmur heard.    No gallop.  Pulmonary:     Effort: Pulmonary effort is normal. No respiratory distress.     Breath sounds: Normal breath sounds. No wheezing or rales.  Chest:     Chest wall: No tenderness.  Musculoskeletal:        General: No tenderness.     Cervical back: Normal range of motion and neck supple.  Skin:    General: Skin is warm and dry.  Neurological:     Mental Status: He is alert and oriented to person, place, and time.  Psychiatric:        Behavior: Behavior normal.        Thought Content: Thought content normal.        Judgment: Judgment normal.     BP 110/70 (BP Location: Left Arm, Patient Position: Sitting, Cuff Size: Large)   Pulse 66   Temp 98.3 F (36.8 C) (Oral)   Resp 18   Ht 5' 9.75" (1.772 m)   Wt 240 lb 6.4 oz (109 kg)   SpO2 97%   BMI 34.74 kg/m  Wt Readings from Last 3 Encounters:  10/04/22 240 lb 6.4 oz (109 kg)  06/26/22  243 lb 1.3 oz (110.3 kg)  05/07/22 243 lb (110.2 kg)       Assessment & Plan:   Problem List Items Addressed This Visit       Unprioritized   Hyperlipidemia    Encourage heart healthy diet such as MIND or DASH diet, increase exercise, avoid trans fats, simple carbohydrates and  processed foods, consider a krill or fish or flaxseed oil cap daily.        Relevant Orders   CBC with Differential/Platelet   Comprehensive metabolic panel   Lipid panel   HTN (hypertension) (Chronic)    Well controlled, no changes to meds. Encouraged heart healthy diet such as the DASH diet and exercise as tolerated.        Relevant Orders   CBC with Differential/Platelet   Comprehensive metabolic panel   Lipid panel   DM2 (diabetes mellitus, type 2) (HCC) (Chronic)    hgba1c to be checked, minimize simple carbs. Increase exercise as tolerated. Continue current meds       Chronic HFrEF (heart failure with reduced ejection fraction) (HCC) (Chronic)    Per cardiology      Other Visit Diagnoses     Diabetes mellitus type II, non insulin dependent ([place])    -  Primary   Relevant Orders   Hemoglobin A1c   Microalbumin / creatinine urine ratio   CBC with Differential/Platelet   Comprehensive metabolic panel   Lipid panel   Disorder of pigmentation, unspecified       Relevant Medications   hydroquinone 4 % cream      Meds ordered this encounter  Medications   hydroquinone 4 % cream    Sig: APPLY TO AFFECTED AREA TWICE A DAY    Dispense:  28.35 g    Refill:  1    DX Code Needed  .    I, Alfred Martin, personally preformed the services described in this documentation.  All medical record entries made by the scribe were at my direction and in my presence.  I have reviewed the chart and discharge instructions (if applicable) and agree that the record reflects my personal performance and is accurate and complete. 10/04/2022.   I,Verona Buck,acting as a Education administrator for [employer], DO.,have documented all relevant documentation on the behalf of [name], DO,as directed by  [name], DO while in the presence of [name], DO.    [name], DO

## 2022-10-04 NOTE — Patient Instructions (Signed)

## 2022-10-09 ENCOUNTER — Other Ambulatory Visit: Payer: Self-pay | Admitting: Family Medicine

## 2022-10-10 ENCOUNTER — Other Ambulatory Visit: Payer: Self-pay | Admitting: Family Medicine

## 2022-10-10 DIAGNOSIS — J0140 Acute pansinusitis, unspecified: Secondary | ICD-10-CM

## 2022-10-14 ENCOUNTER — Encounter: Payer: Self-pay | Admitting: *Deleted

## 2022-11-03 ENCOUNTER — Other Ambulatory Visit: Payer: Self-pay | Admitting: Family Medicine

## 2022-11-27 ENCOUNTER — Other Ambulatory Visit: Payer: Self-pay | Admitting: Family Medicine

## 2022-11-27 DIAGNOSIS — I10 Essential (primary) hypertension: Secondary | ICD-10-CM

## 2022-12-06 ENCOUNTER — Telehealth: Payer: BC Managed Care – PPO | Admitting: Emergency Medicine

## 2022-12-06 DIAGNOSIS — R051 Acute cough: Secondary | ICD-10-CM

## 2022-12-06 NOTE — Progress Notes (Signed)
Because your symptoms don't sound like the flu and because of the dizziness you are experiencing with coughing, I feel your condition warrants further evaluation and I recommend that you be seen in a face to face visit. I think you can be seen at an urgent care but most are not open this time of night. I ask you to use your judgement - if you feel your coughing/breathing is an emergency, please go to the ER or call 911. If you feel stable enough to wait to go to an urgent care in the morning, you can choose that instead.   I also recommend that you test for COVID; home tests are fine to use.     NOTE: There will be NO CHARGE for this eVisit   If you are having a true medical emergency please call 911.      For an urgent face to face visit, [place] has seven urgent care centers for your convenience:     [facility] [place] [facility] [place] Get Driving Directions [phone] [address], [place] [address]    [facility]) Get Driving Directions [phone] [name], [place] [address]  [facility] [place] [facility]) Get Driving Directions [phone] [address] [place],  [place]  [address]  [facility] at [address] Get Driving Directions  [phone] [address] [place],  [place] [address]   [facility] Get Driving Directions [phone] [address], [place], [place] [address]   [facility] Get Driving Directions  [phone] [address], [place] [address]   [facility] [place] Get Driving Directions [phone] [address] [place], [place] [address]  Your MyChart E-visit questionnaire answers were reviewed by a board certified advanced clinical practitioner to  complete your personal care plan based on your specific symptoms.  Thank you for using e-Visits.

## 2022-12-19 ENCOUNTER — Other Ambulatory Visit: Payer: Self-pay | Admitting: Family Medicine

## 2022-12-19 DIAGNOSIS — E1169 Type 2 diabetes mellitus with other specified complication: Secondary | ICD-10-CM

## 2022-12-23 ENCOUNTER — Other Ambulatory Visit: Payer: Self-pay | Admitting: Family Medicine

## 2023-02-03 ENCOUNTER — Other Ambulatory Visit: Payer: Self-pay | Admitting: Family Medicine

## 2023-02-15 ENCOUNTER — Other Ambulatory Visit: Payer: Self-pay | Admitting: Family Medicine

## 2023-02-15 DIAGNOSIS — I10 Essential (primary) hypertension: Secondary | ICD-10-CM

## 2023-02-24 ENCOUNTER — Ambulatory Visit: Payer: BC Managed Care – PPO | Admitting: Family Medicine

## 2023-02-24 ENCOUNTER — Encounter: Payer: Self-pay | Admitting: Family Medicine

## 2023-02-24 VITALS — BP 130/82 | HR 52 | Temp 98.4°F | Resp 18 | Ht 69.75 in | Wt 244.6 lb

## 2023-02-24 DIAGNOSIS — E119 Type 2 diabetes mellitus without complications: Secondary | ICD-10-CM

## 2023-02-24 DIAGNOSIS — M4802 Spinal stenosis, cervical region: Secondary | ICD-10-CM

## 2023-02-24 DIAGNOSIS — M542 Cervicalgia: Secondary | ICD-10-CM

## 2023-02-24 DIAGNOSIS — E785 Hyperlipidemia, unspecified: Secondary | ICD-10-CM | POA: Diagnosis not present

## 2023-02-24 DIAGNOSIS — I5022 Chronic systolic (congestive) heart failure: Secondary | ICD-10-CM

## 2023-02-24 DIAGNOSIS — I10 Essential (primary) hypertension: Secondary | ICD-10-CM | POA: Diagnosis not present

## 2023-02-24 DIAGNOSIS — E1169 Type 2 diabetes mellitus with other specified complication: Secondary | ICD-10-CM | POA: Diagnosis not present

## 2023-02-24 DIAGNOSIS — N529 Male erectile dysfunction, unspecified: Secondary | ICD-10-CM | POA: Diagnosis not present

## 2023-02-24 DIAGNOSIS — E7800 Pure hypercholesterolemia, unspecified: Secondary | ICD-10-CM

## 2023-02-24 LAB — CBC WITH DIFFERENTIAL/PLATELET
Basophils Absolute: 0 10*3/uL (ref 0.0–0.1)
Basophils Relative: 0.5 % (ref 0.0–3.0)
Eosinophils Absolute: 0.2 10*3/uL (ref 0.0–0.7)
Eosinophils Relative: 3 % (ref 0.0–5.0)
HCT: 45.1 % (ref 39.0–52.0)
Hemoglobin: 14.5 g/dL (ref 13.0–17.0)
Lymphocytes Relative: 47.9 % — ABNORMAL HIGH (ref 12.0–46.0)
Lymphs Abs: 3.1 10*3/uL (ref 0.7–4.0)
MCHC: 32.2 g/dL (ref 30.0–36.0)
MCV: 85 fl (ref 78.0–100.0)
Monocytes Absolute: 0.7 10*3/uL (ref 0.1–1.0)
Monocytes Relative: 11.1 % (ref 3.0–12.0)
Neutro Abs: 2.4 10*3/uL (ref 1.4–7.7)
Neutrophils Relative %: 37.5 % — ABNORMAL LOW (ref 43.0–77.0)
Platelets: 181 10*3/uL (ref 150.0–400.0)
RBC: 5.3 Mil/uL (ref 4.22–5.81)
RDW: 15.5 % (ref 11.5–15.5)
WBC: 6.4 10*3/uL (ref 4.0–10.5)

## 2023-02-24 LAB — COMPREHENSIVE METABOLIC PANEL
ALT: 36 U/L (ref 0–53)
AST: 28 U/L (ref 0–37)
Albumin: 4.3 g/dL (ref 3.5–5.2)
Alkaline Phosphatase: 37 U/L — ABNORMAL LOW (ref 39–117)
BUN: 15 mg/dL (ref 6–23)
CO2: 29 mEq/L (ref 19–32)
Calcium: 9.5 mg/dL (ref 8.4–10.5)
Chloride: 100 mEq/L (ref 96–112)
Creatinine, Ser: 1.17 mg/dL (ref 0.40–1.50)
GFR: 68.92 mL/min (ref >60.00)
Glucose, Bld: 108 mg/dL — ABNORMAL HIGH (ref 70–99)
Potassium: 3.7 mEq/L (ref 3.5–5.1)
Sodium: 137 mEq/L (ref 135–145)
Total Bilirubin: 0.6 mg/dL (ref 0.2–1.2)
Total Protein: 7 g/dL (ref 6.0–8.3)

## 2023-02-24 LAB — LIPID PANEL
Cholesterol: 194 mg/dL (ref 0–200)
HDL: 43 mg/dL (ref >39.00)
LDL Cholesterol: 131 mg/dL — ABNORMAL HIGH (ref 0–99)
NonHDL: 151.1
Total CHOL/HDL Ratio: 5
Triglycerides: 99 mg/dL (ref 0.0–149.0)
VLDL: 19.8 mg/dL (ref 0.0–40.0)

## 2023-02-24 LAB — MICROALBUMIN / CREATININE URINE RATIO
Creatinine,U: 184.9 mg/dL
Microalb Creat Ratio: 1.3 mg/g (ref 0.0–30.0)
Microalb, Ur: 2.4 mg/dL — ABNORMAL HIGH (ref 0.0–1.9)

## 2023-02-24 LAB — HEMOGLOBIN A1C: Hgb A1c MFr Bld: 6.9 % — ABNORMAL HIGH (ref 4.6–6.5)

## 2023-02-24 MED ORDER — SILDENAFIL CITRATE 100 MG PO TABS: 50.0000 mg | ORAL_TABLET | Freq: Every day | ORAL | 11 refills | Status: AC | PRN

## 2023-02-24 MED ORDER — METHOCARBAMOL 500 MG PO TABS: 500.0000 mg | ORAL_TABLET | Freq: Four times a day (QID) | ORAL | 3 refills | Status: DC | PRN

## 2023-02-24 NOTE — Assessment & Plan Note (Signed)
Well controlled, no changes to meds. Encouraged heart healthy diet such as the DASH diet and exercise as tolerated.  °

## 2023-02-24 NOTE — Assessment & Plan Note (Signed)
Encourage heart healthy diet such as MIND or DASH diet, increase exercise, avoid trans fats, simple carbohydrates and processed foods, consider a krill or fish or flaxseed oil cap daily.  °

## 2023-02-24 NOTE — Progress Notes (Signed)
Subjective:   By signing my name below, I, Alfred Martin, attest that this documentation has been prepared under the direction and in the presence of [name], DO. 02/24/2023   Patient ID: Alfred Martin, male    DOB: 04-27-1966, 57 y.o.   MRN: [id number]  Chief Complaint  Patient presents with   Hypertension   Hyperlipidemia    HPI Patient is in today for an office visit.    Blood pressure His blood pressure is stable during this appointment. He takes 25 mg metoprolol succinate daily PO and 25 mg hydrochlorothiazide daily PO to manage it. He is not taking 24-26 mg Entresto for his heart function.  BP Readings from Last 3 Encounters:  02/24/23 130/82  10/04/22 110/70  07/12/22 100/80    Blood sugar He checks his sugars regularly, but has not in the last week or so. His last A1C was elevated during his last blood work. He is taking 40 mg Crestor to manage it.  Lab Results  Component Value Date   HGBA1C 6.4 10/04/2022     Neck pain  He complains of muscle pain in his neck.  He has taken 500 mg methocarbamol in the past and finds relief with it.   Viagra He is requesting a refill on Viagra during this visit.   Immunizations  He has not received any Covid-19 immunizations or the shingles immunizations.   Social History  His aunt recently passed from CHF.   Past Medical History:  Diagnosis Date   Abnormal echocardiogram 07/27/2015   Acute renal failure superimposed on stage 2 chronic kidney disease ([place]) 05/16/2021   Allergy    Cardiomyopathy ([place]) 07/27/2015   Overview:  Dilated.   Chronic HFrEF (heart failure with reduced ejection fraction) ([place]) 05/17/2021   Class 2 severe obesity with serious comorbidity and body mass index (BMI) of 35.0 to 35.9 in adult [facility]) 05/23/2018   DM2 (diabetes mellitus, type 2) ([place]) 05/17/2021   Dyspnea on exertion 06/20/2015   6 Minute Walk Test 06/20/15- 99% at rest, 97% at end of walk, 99% after 2 minutes rest. POatient  walked 528 meters without stopping or incident. No oxgenation limitation on this test.   Gout of multiple sites 11/21/2017   Guaiac positive stools 04/03/2021   Hemorrhoids 04/03/2021   HTN (hypertension)    Hyperlipidemia LDL goal <100 08/12/2017   Joint pain    Joint swelling 08/25/2015   Obesity (BMI 30-39.9) 07/04/2021   Olecranon bursitis, left elbow 07/29/2018   Preventative health care 06/01/2015   Radiculopathy 05/17/2021   Urinary frequency 04/03/2021    Past Surgical History:  Procedure Laterality Date   COLONOSCOPY WITH PROPOFOL N/A 09/12/2021   Procedure: COLONOSCOPY WITH PROPOFOL;  Surgeon: Lavena Bullion, DO;  Location: [facility];  Service: Gastroenterology;  Laterality: N/A;   POLYPECTOMY  09/12/2021   Procedure: POLYPECTOMY;  Surgeon: Lavena Bullion, DO;  Location: [facility];  Service: Gastroenterology;;   SUBMUCOSAL TATTOO INJECTION  09/12/2021   Procedure: SUBMUCOSAL TATTOO INJECTION;  Surgeon: Lavena Bullion, DO;  Location: [facility];  Service: Gastroenterology;;   TONSILLECTOMY      Family History  Problem Relation Age of Onset   Arthritis Mother        Severe-- RA   Diabetes Mother    Hyperlipidemia Mother    High blood pressure Mother    Hypertension Mother    Obesity Mother    Diabetes Father    Hyperlipidemia Father  High blood pressure Father    Hypertension Father    Liver disease Father    Diabetes Brother    Hypertension Brother    HIV/AIDS Brother    Hypertension Brother    Drug abuse Brother        chf-- crack / cocaine   Congestive Heart Failure Brother    Heart disease Maternal Grandmother    Breast cancer Paternal Grandmother    Breast cancer Maternal Aunt    Arthritis Maternal Aunt        RA   Heart failure Maternal Aunt    Arthritis Maternal Aunt    Arthritis Maternal Aunt    Colon cancer Paternal Uncle    Congestive Heart Failure Paternal Uncle    Stroke Paternal Uncle    Colon polyps Neg Hx     Rectal cancer Neg Hx    Stomach cancer Neg Hx     Social History   Socioeconomic History   Marital status: Married    Spouse name: [name]   Number of children: 4   Years of education: Not on file   Highest education level: Not on file  Occupational History   Occupation: high school english teacer    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, prison and probation services  Tobacco Use   Smoking status: Never   Smokeless tobacco: Never  Vaping Use   Vaping Use: Never used  Substance and Sexual Activity   Alcohol use: No    Alcohol/week: 0.0 standard drinks of alcohol   Drug use: No   Sexual activity: Yes  Other Topics Concern   Not on file  Social History Narrative   Exercise-- jogging 3-4 days a week   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   Social Determinants of Radio broadcast assistant Strain: Not on file  Food Insecurity: Not on file  Transportation Needs: Not on file  Physical Activity: Not on file  Stress: Not on file  Social Connections: Not on file  Intimate Partner Violence: Not on file    Outpatient Medications Prior to Visit  Medication Sig Dispense Refill   ACCU-CHEK GUIDE test strip USE AS DIRECTED UP TO 4 TIMES A DAY TO CHECK BLOOD SUGAR 100 strip 1   Accu-Chek Softclix Lancets lancets USE TO CHECK GLUCOSE THREE TIMES DAILY 100 each 1   allopurinol (ZYLOPRIM) 300 MG tablet TAKE 1 TABLET BY MOUTH EVERY DAY 90 tablet 2   aspirin EC 81 MG tablet Take 1 tablet (81 mg total) by mouth daily. Swallow whole. 90 tablet 3   azelastine (ASTELIN) 0.1 % nasal spray Place 1 spray into both nostrils 2 (two) times daily. Use in each nostril as directed 30 mL 12   dapagliflozin propanediol (FARXIGA) 10 MG TABS tablet Take 1 tablet (10 mg total) by mouth daily before breakfast. 30 tablet 11   ENTRESTO 24-26 MG Take 1 tablet by mouth 2 (two) times daily.     ezetimibe (ZETIA) 10 MG tablet TAKE 1 TABLET BY MOUTH EVERY DAY 90 tablet 1   fluticasone (FLONASE) 50 MCG/ACT nasal spray SPRAY 2  SPRAYS INTO EACH NOSTRIL EVERY DAY 48 mL 1   hydrochlorothiazide (HYDRODIURIL) 25 MG tablet Take 1 tablet (25 mg total) by mouth daily. 90 tablet 0   hydroquinone 4 % cream APPLY TO AFFECTED AREA TWICE A DAY 28.35 g 1   levocetirizine (XYZAL) 5 MG tablet TAKE 1 TABLET BY MOUTH EVERY DAY IN THE EVENING 90 tablet 1   metoprolol succinate (  TOPROL-XL) 25 MG 24 hr tablet TAKE 1 TABLET BY MOUTH EVERY DAY 90 tablet 1   metoprolol tartrate (LOPRESSOR) 100 MG tablet TAKE 2 HOURS PRIOR TO CT SCAN 1 tablet 0   rosuvastatin (CRESTOR) 40 MG tablet Take 1 tablet (40 mg total) by mouth daily. 90 tablet 1   sacubitril-valsartan (ENTRESTO) 24-26 MG Take 1 tablet by mouth 2 (two) times daily. 60 tablet 11   spironolactone (ALDACTONE) 25 MG tablet Take 0.5 tablets (12.5 mg total) by mouth daily. 45 tablet 3   methocarbamol (ROBAXIN) 500 MG tablet Take 1 tablet (500 mg total) by mouth every 8 (eight) hours as needed for muscle spasms. 30 tablet 0   No facility-administered medications prior to visit.    No Known Allergies  Review of Systems  Constitutional:  Negative for fever and malaise/fatigue.  HENT:  Negative for congestion.   Eyes:  Negative for blurred vision.  Respiratory:  Negative for shortness of breath.   Cardiovascular:  Negative for chest pain, palpitations and leg swelling.  Gastrointestinal:  Negative for abdominal pain, blood in stool and nausea.  Genitourinary:  Negative for dysuria and frequency.  Musculoskeletal:  Negative for falls.       (+) muscle pain in neck  Skin:  Negative for rash.  Neurological:  Negative for dizziness, loss of consciousness and headaches.  Endo/Heme/Allergies:  Negative for environmental allergies.  Psychiatric/Behavioral:  Negative for depression. The patient is not nervous/anxious.        Objective:    Physical Exam Vitals and nursing note reviewed.  Constitutional:      General: He is not in acute distress.    Appearance: Normal appearance.  HENT:      Head: Normocephalic and atraumatic.     Right Ear: External ear normal.     Left Ear: External ear normal.  Eyes:     Extraocular Movements: Extraocular movements intact.     Pupils: Pupils are equal, round, and reactive to light.  Cardiovascular:     Rate and Rhythm: Normal rate and regular rhythm.     Heart sounds: Normal heart sounds. No murmur heard.    No gallop.  Pulmonary:     Effort: Pulmonary effort is normal. No respiratory distress.     Breath sounds: Normal breath sounds. No wheezing or rales.  Musculoskeletal:        General: Normal range of motion.  Skin:    General: Skin is warm.  Neurological:     General: No focal deficit present.     Mental Status: He is alert and oriented to person, place, and time.  Psychiatric:        Mood and Affect: Mood normal.        Behavior: Behavior normal.        Thought Content: Thought content normal.        Judgment: Judgment normal.     BP 130/82 (BP Location: Left Arm, Patient Position: Sitting, Cuff Size: Large)   Pulse (!) 52   Temp 98.4 F (36.9 C) (Oral)   Resp 18   Ht 5' 9.75" (1.772 m)   Wt 244 lb 9.6 oz (110.9 kg)   SpO2 97%   BMI 35.35 kg/m  Wt Readings from Last 3 Encounters:  02/24/23 244 lb 9.6 oz (110.9 kg)  10/04/22 240 lb 6.4 oz (109 kg)  06/26/22 243 lb 1.3 oz (110.3 kg)       Assessment & Plan:  Diabetes mellitus type II, non insulin dependent (  [place]) -     Lipid panel -     CBC with Differential/Platelet -     Hemoglobin A1c -     Microalbumin / creatinine urine ratio  Chronic HFrEF (heart failure with reduced ejection fraction) ([place])  Primary hypertension Assessment & Plan: Well controlled, no changes to meds. Encouraged heart healthy diet such as the DASH diet and exercise as tolerated.    Orders: -     Lipid panel -     CBC with Differential/Platelet -     Comprehensive metabolic panel -     Hemoglobin A1c -     Microalbumin / creatinine urine ratio  Hyperlipidemia associated  with type 2 diabetes mellitus (H[facility]) -     Lipid panel -     Comprehensive metabolic panel  Cervicalgia  Erectile dysfunction, unspecified erectile dysfunction type -     PSA  Vasculogenic erectile dysfunction, unspecified vasculogenic erectile dysfunction type -     Sildenafil Citrate; Take 0.5-1 tablets (50-100 mg total) by mouth daily as needed for erectile dysfunction.  Dispense: 10 tablet; Refill: 11  Cervical stenosis of spine -     Methocarbamol; Take 1 tablet (500 mg total) by mouth every 6 (six) hours as needed for muscle spasms.  Dispense: 90 tablet; Refill: 3  Pure hypercholesterolemia Assessment & Plan: Encourage heart healthy diet such as MIND or DASH diet, increase exercise, avoid trans fats, simple carbohydrates and processed foods, consider a krill or fish or flaxseed oil cap daily.       IAnn Held, DO, personally preformed the services described in this documentation.  All medical record entries made by the scribe were at my direction and in my presence.  I have reviewed the chart and discharge instructions (if applicable) and agree that the record reflects my personal performance and is accurate and complete. 02/24/2023  [name], DO   I,Kennedy C Lynn,acting as a scribe for [name], DO.,have documented all relevant documentation on the behalf of [name], DO,as directed by  [name], DO while in the presence of [name], DO.

## 2023-02-25 LAB — PSA: PSA: 4.94 ng/mL — ABNORMAL HIGH (ref 0.10–4.00)

## 2023-02-26 ENCOUNTER — Other Ambulatory Visit: Payer: Self-pay | Admitting: Family Medicine

## 2023-02-26 DIAGNOSIS — E119 Type 2 diabetes mellitus without complications: Secondary | ICD-10-CM

## 2023-02-26 DIAGNOSIS — I10 Essential (primary) hypertension: Secondary | ICD-10-CM

## 2023-02-26 DIAGNOSIS — E1169 Type 2 diabetes mellitus with other specified complication: Secondary | ICD-10-CM

## 2023-03-03 ENCOUNTER — Other Ambulatory Visit: Payer: Self-pay

## 2023-03-03 NOTE — Progress Notes (Deleted)
HPI: FU CHF. Long h/o CM. Cardiac catheterization in the past showed no significant coronary disease by report but no records available.  Echocardiogram June 2022 showed ejection fraction 25 to 30%, moderate left ventricular enlargement, mild mitral regurgitation.  Note LV function similar to July 2016.  Monitor June 2022 showed sinus rhythm with occasional PACs, PVCs (3.2%). Last echocardiogram August 2023 showed ejection fraction 30 to [id number], grade 1 diastolic dysfunction.  Calcium score September 2023 765 which was 99th percentile.  Since last seen patient denies dyspnea, chest pain, palpitations or syncope.   Current Outpatient Medications  Medication Sig Dispense Refill   ACCU-CHEK GUIDE test strip USE AS DIRECTED UP TO 4 TIMES A DAY TO CHECK BLOOD SUGAR 100 strip 1   Accu-Chek Softclix Lancets lancets USE TO CHECK GLUCOSE THREE TIMES DAILY 100 each 1   allopurinol (ZYLOPRIM) 300 MG tablet TAKE 1 TABLET BY MOUTH EVERY DAY 90 tablet 2   aspirin EC 81 MG tablet Take 1 tablet (81 mg total) by mouth daily. Swallow whole. 90 tablet 3   azelastine (ASTELIN) 0.1 % nasal spray Place 1 spray into both nostrils 2 (two) times daily. Use in each nostril as directed 30 mL 12   dapagliflozin propanediol (FARXIGA) 10 MG TABS tablet Take 1 tablet (10 mg total) by mouth daily before breakfast. 30 tablet 11   ENTRESTO 24-26 MG Take 1 tablet by mouth 2 (two) times daily.     ezetimibe (ZETIA) 10 MG tablet TAKE 1 TABLET BY MOUTH EVERY DAY 90 tablet 1   fluticasone (FLONASE) 50 MCG/ACT nasal spray SPRAY 2 SPRAYS INTO EACH NOSTRIL EVERY DAY 48 mL 1   hydrochlorothiazide (HYDRODIURIL) 25 MG tablet Take 1 tablet (25 mg total) by mouth daily. 90 tablet 0   hydroquinone 4 % cream APPLY TO AFFECTED AREA TWICE A DAY 28.35 g 1   levocetirizine (XYZAL) 5 MG tablet TAKE 1 TABLET BY MOUTH EVERY DAY IN THE EVENING 90 tablet 1   methocarbamol (ROBAXIN) 500 MG tablet Take 1 tablet (500 mg total) by mouth every 6 (six) hours  as needed for muscle spasms. 90 tablet 3   metoprolol succinate (TOPROL-XL) 25 MG 24 hr tablet TAKE 1 TABLET BY MOUTH EVERY DAY 90 tablet 1   metoprolol tartrate (LOPRESSOR) 100 MG tablet TAKE 2 HOURS PRIOR TO CT SCAN 1 tablet 0   rosuvastatin (CRESTOR) 40 MG tablet Take 1 tablet (40 mg total) by mouth daily. 90 tablet 1   sacubitril-valsartan (ENTRESTO) 24-26 MG Take 1 tablet by mouth 2 (two) times daily. 60 tablet 11   sildenafil (VIAGRA) 100 MG tablet Take 0.5-1 tablets (50-100 mg total) by mouth daily as needed for erectile dysfunction. 10 tablet 11   spironolactone (ALDACTONE) 25 MG tablet Take 0.5 tablets (12.5 mg total) by mouth daily. 45 tablet 3   No current facility-administered medications for this visit.     Past Medical History:  Diagnosis Date   Abnormal echocardiogram 07/27/2015   Acute renal failure superimposed on stage 2 chronic kidney disease ([place]) 05/16/2021   Allergy    Cardiomyopathy ([place]) 07/27/2015   Overview:  Dilated.   Chronic HFrEF (heart failure with reduced ejection fraction) ([place]) 05/17/2021   Class 2 severe obesity with serious comorbidity and body mass index (BMI) of 35.0 to 35.9 in adult [facility]) 05/23/2018   DM2 (diabetes mellitus, type 2) ([place]) 05/17/2021   Dyspnea on exertion 06/20/2015   6 Minute Walk Test 06/20/15- 99% at rest, 97% at  end of walk, 99% after 2 minutes rest. POatient walked 528 meters without stopping or incident. No oxgenation limitation on this test.   Gout of multiple sites 11/21/2017   Guaiac positive stools 04/03/2021   Hemorrhoids 04/03/2021   HTN (hypertension)    Hyperlipidemia LDL goal <100 08/12/2017   Joint pain    Joint swelling 08/25/2015   Obesity (BMI 30-39.9) 07/04/2021   Olecranon bursitis, left elbow 07/29/2018   Preventative health care 06/01/2015   Radiculopathy 05/17/2021   Urinary frequency 04/03/2021    Past Surgical History:  Procedure Laterality Date   COLONOSCOPY WITH PROPOFOL N/A 09/12/2021    Procedure: COLONOSCOPY WITH PROPOFOL;  Surgeon: Lavena Bullion, DO;  [facility];  Service: Gastroenterology;  Laterality: N/A;   POLYPECTOMY  09/12/2021   Procedure: POLYPECTOMY;  Surgeon: Lavena Bullion, DO;  [facility];  Service: Gastroenterology;;   SUBMUCOSAL TATTOO INJECTION  09/12/2021   Procedure: SUBMUCOSAL TATTOO INJECTION;  Surgeon: Lavena Bullion, DO;  [facility];  Service: Gastroenterology;;   TONSILLECTOMY      Social History   Socioeconomic History   Marital status: Married    Spouse name: [name]   Number of children: 4   Years of education: Not on file   Highest education level: Not on file  Occupational History   Occupation: high school english teacer    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, prison and probation services  Tobacco Use   Smoking status: Never   Smokeless tobacco: Never  Vaping Use   Vaping Use: Never used  Substance and Sexual Activity   Alcohol use: No    Alcohol/week: 0.0 standard drinks of alcohol   Drug use: No   Sexual activity: Yes  Other Topics Concern   Not on file  Social History Narrative   Exercise-- jogging 3-4 days a week   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   Social Determinants of Radio broadcast assistant Strain: Not on file  Food Insecurity: Not on file  Transportation Needs: Not on file  Physical Activity: Not on file  Stress: Not on file  Social Connections: Not on file  Intimate Partner Violence: Not on file    Family History  Problem Relation Age of Onset   Arthritis Mother        Severe-- RA   Diabetes Mother    Hyperlipidemia Mother    High blood pressure Mother    Hypertension Mother    Obesity Mother    Diabetes Father    Hyperlipidemia Father    High blood pressure Father    Hypertension Father    Liver disease Father    Diabetes Brother    Hypertension Brother    HIV/AIDS Brother    Hypertension Brother    Drug abuse Brother        chf-- crack / cocaine    Congestive Heart Failure Brother    Heart disease Maternal Grandmother    Breast cancer Paternal Grandmother    Breast cancer Maternal Aunt    Arthritis Maternal Aunt        RA   Heart failure Maternal Aunt    Arthritis Maternal Aunt    Arthritis Maternal Aunt    Colon cancer Paternal Uncle    Congestive Heart Failure Paternal Uncle    Stroke Paternal Uncle    Colon polyps Neg Hx    Rectal cancer Neg Hx    Stomach cancer Neg Hx     ROS: no fevers  or chills, productive cough, hemoptysis, dysphasia, odynophagia, melena, hematochezia, dysuria, hematuria, rash, seizure activity, orthopnea, PND, pedal edema, claudication. Remaining systems are negative.  Physical Exam: Well-developed well-nourished in no acute distress.  Skin is warm and dry.  HEENT is normal.  Neck is supple.  Chest is clear to auscultation with normal expansion.  Cardiovascular exam is regular rate and rhythm.  Abdominal exam nontender or distended. No masses palpated. Extremities show no edema. neuro grossly intact  ECG- personally reviewed  A/P  1 cardiomyopathy-as outlined previously etiology of LV dysfunction is unclear.  Previous cardiac catheterization revealed no coronary disease.  However follow-up calcium score elevated.  Will arrange cardiac CTA to rule out obstructive disease.  Continue Entresto, Toprol, Farxiga and spironolactone.  2 chronic systolic congestive heart failure-patient remains euvolemic.  Continue present medications as outlined.  3 hypertension-blood pressure controlled.  Continue present medications.  4 hyperlipidemia-continue Crestor and Zetia.  Kirk Ruths, MD

## 2023-03-05 ENCOUNTER — Other Ambulatory Visit: Payer: Self-pay | Admitting: Family Medicine

## 2023-03-05 ENCOUNTER — Encounter: Payer: Self-pay | Admitting: Family Medicine

## 2023-03-05 DIAGNOSIS — I10 Essential (primary) hypertension: Secondary | ICD-10-CM

## 2023-03-05 DIAGNOSIS — E1169 Type 2 diabetes mellitus with other specified complication: Secondary | ICD-10-CM

## 2023-03-06 MED ORDER — AMLODIPINE BESY-BENAZEPRIL HCL 10-40 MG PO CAPS: ORAL_CAPSULE | ORAL | 1 refills | Status: DC

## 2023-03-06 NOTE — Telephone Encounter (Signed)
Duplicate - waiting on response from provider

## 2023-03-06 NOTE — Telephone Encounter (Signed)
Looks like this medication was stopped last year around 08/23. Pt also has different blood pressure on his list. Please advise

## 2023-03-10 ENCOUNTER — Ambulatory Visit: Payer: BC Managed Care – PPO | Attending: Cardiology | Admitting: Cardiology

## 2023-04-26 ENCOUNTER — Encounter: Payer: Self-pay | Admitting: Family Medicine

## 2023-04-30 ENCOUNTER — Other Ambulatory Visit: Payer: Self-pay | Admitting: Family Medicine

## 2023-04-30 DIAGNOSIS — E119 Type 2 diabetes mellitus without complications: Secondary | ICD-10-CM

## 2023-04-30 MED ORDER — OZEMPIC (0.25 OR 0.5 MG/DOSE) 2 MG/3ML ~~LOC~~ SOPN: 0.2500 mg | PEN_INJECTOR | SUBCUTANEOUS | 0 refills | Status: DC

## 2023-05-05 ENCOUNTER — Other Ambulatory Visit: Payer: Self-pay | Admitting: Family Medicine

## 2023-05-10 ENCOUNTER — Other Ambulatory Visit: Payer: Self-pay | Admitting: Family Medicine

## 2023-05-10 DIAGNOSIS — I10 Essential (primary) hypertension: Secondary | ICD-10-CM

## 2023-05-12 ENCOUNTER — Encounter: Payer: Self-pay | Admitting: *Deleted

## 2023-05-26 ENCOUNTER — Encounter: Payer: Self-pay | Admitting: *Deleted

## 2023-05-26 ENCOUNTER — Other Ambulatory Visit: Payer: Self-pay | Admitting: Family Medicine

## 2023-05-26 DIAGNOSIS — E119 Type 2 diabetes mellitus without complications: Secondary | ICD-10-CM

## 2023-06-03 ENCOUNTER — Other Ambulatory Visit (INDEPENDENT_AMBULATORY_CARE_PROVIDER_SITE_OTHER): Payer: BC Managed Care – PPO

## 2023-06-03 DIAGNOSIS — E1169 Type 2 diabetes mellitus with other specified complication: Secondary | ICD-10-CM

## 2023-06-03 DIAGNOSIS — E119 Type 2 diabetes mellitus without complications: Secondary | ICD-10-CM | POA: Diagnosis not present

## 2023-06-03 DIAGNOSIS — E785 Hyperlipidemia, unspecified: Secondary | ICD-10-CM | POA: Diagnosis not present

## 2023-06-03 LAB — COMPREHENSIVE METABOLIC PANEL
ALT: 44 U/L (ref 0–53)
AST: 33 U/L (ref 0–37)
Albumin: 4.1 g/dL (ref 3.5–5.2)
Alkaline Phosphatase: 31 U/L — ABNORMAL LOW (ref 39–117)
BUN: 19 mg/dL (ref 6–23)
CO2: 26 mEq/L (ref 19–32)
Calcium: 9.4 mg/dL (ref 8.4–10.5)
Chloride: 104 mEq/L (ref 96–112)
Creatinine, Ser: 1.29 mg/dL (ref 0.40–1.50)
GFR: 61.18 mL/min (ref >60.00)
Glucose, Bld: 127 mg/dL — ABNORMAL HIGH (ref 70–99)
Potassium: 3.9 mEq/L (ref 3.5–5.1)
Sodium: 138 mEq/L (ref 135–145)
Total Bilirubin: 0.7 mg/dL (ref 0.2–1.2)
Total Protein: 6.9 g/dL (ref 6.0–8.3)

## 2023-06-03 LAB — LIPID PANEL
Cholesterol: 180 mg/dL (ref 0–200)
HDL: 41.1 mg/dL (ref >39.00)
LDL Cholesterol: 120 mg/dL — ABNORMAL HIGH (ref 0–99)
NonHDL: 138.85
Total CHOL/HDL Ratio: 4
Triglycerides: 95 mg/dL (ref 0.0–149.0)
VLDL: 19 mg/dL (ref 0.0–40.0)

## 2023-06-03 LAB — HEMOGLOBIN A1C: Hgb A1c MFr Bld: 7.1 % — ABNORMAL HIGH (ref 4.6–6.5)

## 2023-06-09 ENCOUNTER — Encounter: Payer: Self-pay | Admitting: Family Medicine

## 2023-06-29 ENCOUNTER — Encounter: Payer: Self-pay | Admitting: Family Medicine

## 2023-06-29 ENCOUNTER — Other Ambulatory Visit: Payer: Self-pay | Admitting: Family Medicine

## 2023-06-29 DIAGNOSIS — E119 Type 2 diabetes mellitus without complications: Secondary | ICD-10-CM

## 2023-07-31 ENCOUNTER — Other Ambulatory Visit: Payer: Self-pay | Admitting: Family Medicine

## 2023-07-31 ENCOUNTER — Encounter: Payer: Self-pay | Admitting: Family Medicine

## 2023-07-31 DIAGNOSIS — I10 Essential (primary) hypertension: Secondary | ICD-10-CM

## 2023-08-27 ENCOUNTER — Other Ambulatory Visit: Payer: Self-pay | Admitting: Family Medicine

## 2023-09-09 ENCOUNTER — Other Ambulatory Visit: Payer: Self-pay | Admitting: Family Medicine

## 2023-09-09 DIAGNOSIS — L819 Disorder of pigmentation, unspecified: Secondary | ICD-10-CM

## 2023-09-21 ENCOUNTER — Other Ambulatory Visit: Payer: Self-pay | Admitting: Family Medicine

## 2023-09-21 DIAGNOSIS — E119 Type 2 diabetes mellitus without complications: Secondary | ICD-10-CM

## 2023-09-22 ENCOUNTER — Other Ambulatory Visit: Payer: Self-pay | Admitting: Family Medicine

## 2023-09-22 DIAGNOSIS — E119 Type 2 diabetes mellitus without complications: Secondary | ICD-10-CM

## 2023-10-05 ENCOUNTER — Other Ambulatory Visit: Payer: Self-pay | Admitting: Family Medicine

## 2023-10-05 DIAGNOSIS — E119 Type 2 diabetes mellitus without complications: Secondary | ICD-10-CM

## 2023-10-06 ENCOUNTER — Encounter: Payer: Self-pay | Admitting: Family Medicine

## 2023-10-06 ENCOUNTER — Ambulatory Visit: Payer: BC Managed Care – PPO | Admitting: Family Medicine

## 2023-10-06 VITALS — BP 120/60 | HR 41 | Temp 98.5°F | Resp 18 | Ht 69.75 in | Wt 239.8 lb

## 2023-10-06 DIAGNOSIS — B351 Tinea unguium: Secondary | ICD-10-CM | POA: Diagnosis not present

## 2023-10-06 DIAGNOSIS — E7800 Pure hypercholesterolemia, unspecified: Secondary | ICD-10-CM

## 2023-10-06 DIAGNOSIS — E1169 Type 2 diabetes mellitus with other specified complication: Secondary | ICD-10-CM

## 2023-10-06 DIAGNOSIS — J0140 Acute pansinusitis, unspecified: Secondary | ICD-10-CM

## 2023-10-06 DIAGNOSIS — I10 Essential (primary) hypertension: Secondary | ICD-10-CM | POA: Diagnosis not present

## 2023-10-06 DIAGNOSIS — E785 Hyperlipidemia, unspecified: Secondary | ICD-10-CM

## 2023-10-06 DIAGNOSIS — E119 Type 2 diabetes mellitus without complications: Secondary | ICD-10-CM

## 2023-10-06 DIAGNOSIS — Z7984 Long term (current) use of oral hypoglycemic drugs: Secondary | ICD-10-CM

## 2023-10-06 DIAGNOSIS — E1165 Type 2 diabetes mellitus with hyperglycemia: Secondary | ICD-10-CM

## 2023-10-06 LAB — COMPREHENSIVE METABOLIC PANEL
ALT: 31 U/L (ref 0–53)
AST: 25 U/L (ref 0–37)
Albumin: 4.1 g/dL (ref 3.5–5.2)
Alkaline Phosphatase: 30 U/L — ABNORMAL LOW (ref 39–117)
BUN: 21 mg/dL (ref 6–23)
CO2: 29 meq/L (ref 19–32)
Calcium: 9.7 mg/dL (ref 8.4–10.5)
Chloride: 102 meq/L (ref 96–112)
Creatinine, Ser: 1.44 mg/dL (ref 0.40–1.50)
GFR: 53.49 mL/min — ABNORMAL LOW (ref >60.00)
Glucose, Bld: 103 mg/dL — ABNORMAL HIGH (ref 70–99)
Potassium: 4 meq/L (ref 3.5–5.1)
Sodium: 139 meq/L (ref 135–145)
Total Bilirubin: 0.9 mg/dL (ref 0.2–1.2)
Total Protein: 6.7 g/dL (ref 6.0–8.3)

## 2023-10-06 LAB — CBC WITH DIFFERENTIAL/PLATELET
Basophils Absolute: 0 10*3/uL (ref 0.0–0.1)
Basophils Relative: 0.6 % (ref 0.0–3.0)
Eosinophils Absolute: 0.1 10*3/uL (ref 0.0–0.7)
Eosinophils Relative: 2.7 % (ref 0.0–5.0)
HCT: 46 % (ref 39.0–52.0)
Hemoglobin: 14.4 g/dL (ref 13.0–17.0)
Lymphocytes Relative: 43.4 % (ref 12.0–46.0)
Lymphs Abs: 2.1 10*3/uL (ref 0.7–4.0)
MCHC: 31.3 g/dL (ref 30.0–36.0)
MCV: 87.2 fL (ref 78.0–100.0)
Monocytes Absolute: 0.7 10*3/uL (ref 0.1–1.0)
Monocytes Relative: 13.5 % — ABNORMAL HIGH (ref 3.0–12.0)
Neutro Abs: 2 10*3/uL (ref 1.4–7.7)
Neutrophils Relative %: 39.8 % — ABNORMAL LOW (ref 43.0–77.0)
Platelets: 153 10*3/uL (ref 150.0–400.0)
RBC: 5.28 Mil/uL (ref 4.22–5.81)
RDW: 15.2 % (ref 11.5–15.5)
WBC: 4.9 10*3/uL (ref 4.0–10.5)

## 2023-10-06 LAB — MICROALBUMIN / CREATININE URINE RATIO
Creatinine,U: 269.6 mg/dL
Microalb Creat Ratio: 0.9 mg/g (ref 0.0–30.0)
Microalb, Ur: 2.5 mg/dL — ABNORMAL HIGH (ref 0.0–1.9)

## 2023-10-06 LAB — LIPID PANEL
Cholesterol: 176 mg/dL (ref 0–200)
HDL: 45.5 mg/dL (ref >39.00)
LDL Cholesterol: 116 mg/dL — ABNORMAL HIGH (ref 0–99)
NonHDL: 130.78
Total CHOL/HDL Ratio: 4
Triglycerides: 73 mg/dL (ref 0.0–149.0)
VLDL: 14.6 mg/dL (ref 0.0–40.0)

## 2023-10-06 LAB — HEMOGLOBIN A1C: Hgb A1c MFr Bld: 6.3 % (ref 4.6–6.5)

## 2023-10-06 LAB — PSA: PSA: 2.35 ng/mL (ref 0.10–4.00)

## 2023-10-06 MED ORDER — AMLODIPINE BESY-BENAZEPRIL HCL 10-40 MG PO CAPS: ORAL_CAPSULE | ORAL | 1 refills | Status: DC

## 2023-10-06 MED ORDER — HYDROCHLOROTHIAZIDE 25 MG PO TABS: 25.0000 mg | ORAL_TABLET | Freq: Every day | ORAL | 1 refills | Status: DC

## 2023-10-06 MED ORDER — FLUTICASONE PROPIONATE 50 MCG/ACT NA SUSP: 2.0000 | Freq: Every day | NASAL | 5 refills | Status: DC

## 2023-10-06 MED ORDER — EFINACONAZOLE 10 % EX SOLN: CUTANEOUS | 2 refills | Status: AC

## 2023-10-06 MED ORDER — ROSUVASTATIN CALCIUM 10 MG PO TABS: 10.0000 mg | ORAL_TABLET | Freq: Every day | ORAL | 3 refills | Status: DC

## 2023-10-06 MED ORDER — OZEMPIC (0.25 OR 0.5 MG/DOSE) 2 MG/3ML ~~LOC~~ SOPN: 0.5000 mg | PEN_INJECTOR | SUBCUTANEOUS | 2 refills | Status: DC

## 2023-10-06 NOTE — Assessment & Plan Note (Signed)
Well controlled, no changes to meds. Encouraged heart healthy diet such as the DASH diet and exercise as tolerated.  °

## 2023-10-06 NOTE — Assessment & Plan Note (Signed)
hgba1c to be checked, minimize simple carbs. Increase exercise as tolerated. Continue current meds  

## 2023-10-06 NOTE — Assessment & Plan Note (Signed)
Tolerating statin, encouraged heart healthy diet, avoid trans fats, minimize simple carbs and saturated fats. Increase exercise as tolerated 

## 2023-10-06 NOTE — Progress Notes (Signed)
Established Patient Office Visit  Subjective   Patient ID: Alfred Martin, male    DOB: 1966/03/19  Age: 57 y.o. MRN: [id number]  Chief Complaint  Patient presents with   Hypertension   Diabetes   Hyperlipidemia   Follow-up    HPI Discussed the use of AI scribe software for clinical note transcription with the patient, who gave verbal consent to proceed.  History of Present Illness   The patient, with a history of heart problems and diabetes, presents with concerns about his heart health. 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 He reports that he was exposed to contaminated water during his service, which he believes may have contributed to his heart condition. He describes his heart condition as a problem with his heart not opening all the way, indicating abnormal diastolic function.  The patient also discusses his high calcium score, which was discovered during a CT scan in September. He expresses concern about the need for cholesterol medication, mentioning that he has tried it before and it made him feel weird. He is open to trying a low dose of Crestor and is willing to change his diet if it would help.  In addition to his heart concerns, the patient also mentions having diabetes. He reports that he needs to get his eyes checked, as it has been over a year since his last eye exam. He also mentions needing to get a recolonoscopy.      Patient Active Problem List   Diagnosis Date Noted   Screening for colorectal cancer    Adenomatous polyp of ascending colon    Adenomatous polyp of transverse colon    Adenomatous polyp of sigmoid colon    Adenomatous polyp of descending colon    Rectal polyp    Diverticulosis of colon without hemorrhage    Grade I internal hemorrhoids    Type 2 diabetes mellitus without complication, without long-term current use of insulin (HCC) 07/31/2021   Obesity (BMI 30-39.9) 07/04/2021   Chronic HFrEF  (heart failure with reduced ejection fraction) (HCC) 05/17/2021   Radiculopathy 05/17/2021   DM2 (diabetes mellitus, type 2) (HCC) 05/17/2021   Acute renal failure superimposed on stage 2 chronic kidney disease (HCC) 05/16/2021   Allergy    CHF (congestive heart failure) (HCC)    Gout    Hyperlipidemia    Joint pain    Urinary frequency 04/03/2021   Fecal occult blood test positive 04/03/2021   Hemorrhoids 04/03/2021   Uncontrolled type 2 diabetes mellitus with hyperglycemia (HCC) 03/23/2020   Olecranon bursitis, left elbow 07/29/2018   Class 2 severe obesity with serious comorbidity and body mass index (BMI) of 35.0 to 35.9 in adult (HCC) 05/23/2018   Gout of multiple sites 11/21/2017   Hyperlipidemia LDL goal <100 08/12/2017   Joint swelling 08/25/2015   Cardiomyopathy (HCC) 07/27/2015   Abnormal echocardiogram 07/27/2015   Dyspnea on exertion 06/20/2015   HTN (hypertension) 06/01/2015   Preventative health care 06/01/2015   Past Medical History:  Diagnosis Date   Abnormal echocardiogram 07/27/2015   Acute renal failure superimposed on stage 2 chronic kidney disease (HCC) 05/16/2021   Allergy    Cardiomyopathy (HCC) 07/27/2015   Overview:  Dilated.   Chronic HFrEF (heart failure with reduced ejection fraction) (HCC) 05/17/2021   Class 2 severe obesity with serious comorbidity and body mass index (BMI) of 35.0 to 35.9 in adult (HCC) 05/23/2018   DM2 (diabetes mellitus, type 2) (HCC)  05/17/2021   Dyspnea on exertion 06/20/2015   6 Minute Walk Test 06/20/15- 99% at rest, 97% at end of walk, 99% after 2 minutes rest. POatient walked 528 meters without stopping or incident. No oxgenation limitation on this test.   Gout of multiple sites 11/21/2017   Guaiac positive stools 04/03/2021   Hemorrhoids 04/03/2021   HTN (hypertension)    Hyperlipidemia LDL goal <100 08/12/2017   Joint pain    Joint swelling 08/25/2015   Obesity (BMI 30-39.9) 07/04/2021   Olecranon bursitis, left  elbow 07/29/2018   Preventative health care 06/01/2015   Radiculopathy 05/17/2021   Urinary frequency 04/03/2021   Past Surgical History:  Procedure Laterality Date   COLONOSCOPY WITH PROPOFOL N/A 09/12/2021   Procedure: COLONOSCOPY WITH PROPOFOL;  Surgeon: Shellia Cleverly, DO;  Location: [facility];  Service: Gastroenterology;  Laterality: N/A;   POLYPECTOMY  09/12/2021   Procedure: POLYPECTOMY;  Surgeon: Shellia Cleverly, DO;  Location: [facility];  Service: Gastroenterology;;   SUBMUCOSAL TATTOO INJECTION  09/12/2021   Procedure: SUBMUCOSAL TATTOO INJECTION;  Surgeon: Shellia Cleverly, DO;  Location: [facility];  Service: Gastroenterology;;   TONSILLECTOMY     Social History   Tobacco Use   Smoking status: Never   Smokeless tobacco: Never  Vaping Use   Vaping status: Never Used  Substance Use Topics   Alcohol use: No    Alcohol/week: 0.0 standard drinks of alcohol   Drug use: No   Social History   Socioeconomic History   Marital status: Married    Spouse name: [name]   Number of children: 4   Years of education: Not on file   Highest education level: Master's degree (e.g., MA, MS, MEng, MEd, MSW, MBA)  Occupational History   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: [employer]    Comment: Retail buyer  Tobacco Use   Smoking status: Never   Smokeless tobacco: Never  Vaping Use   Vaping status: Never Used  Substance and Sexual Activity   Alcohol use: No    Alcohol/week: 0.0 standard drinks of alcohol   Drug use: No   Sexual activity: Yes  Other Topics Concern   Not on file  Social History Narrative   Exercise-- jogging 3-4 days a week   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   Social Determinants of Health   Financial Resource Strain: Patient Declined (10/05/2023)   Overall Financial Resource Strain (CARDIA)    Difficulty of Paying Living Expenses: Patient declined  Food Insecurity: Patient Declined (10/05/2023)   Hunger Vital Sign     Worried About Running Out of Food in the Last Year: Patient declined    Ran Out of Food in the Last Year: Patient declined  Transportation Needs: Unknown (10/05/2023)   PRAPARE - Administrator, Civil Service (Medical): No    Lack of Transportation (Non-Medical): Patient declined  Physical Activity: Sufficiently Active (10/05/2023)   Exercise Vital Sign    Days of Exercise per Week: 3 days    Minutes of Exercise per Session: 60 min  Stress: No Stress Concern Present (10/05/2023)   [employer] of Occupational Health - Occupational Stress Questionnaire    Feeling of Stress : Not at all  Social Connections: Unknown (10/05/2023)   Social Connection and Isolation Panel [NHANES]    Frequency of Communication with Friends and Family: More than three times a week    Frequency of Social Gatherings with Friends and Family: Patient declined  Attends Religious Services: Patient declined    Active Member of Clubs or Organizations: Patient declined    Attends Banker Meetings: Not on file    Marital Status: Married  Catering manager Violence: Not on file   Family Status  Relation Name Status   Mother  Alive   Father  Deceased at age 66       cirrhosis, hep c   Brother  Alive   Brother  Deceased   MGM  (Not Specified)   PGM  (Not Specified)   Mat Aunt  Alive   Mat Aunt  Alive   Mat Aunt  Alive   Pat Uncle  Deceased       chf,    Pat Uncle  Alive   Pat Uncle  Deceased at age 66       HIV, sepsis   Neg Hx  (Not Specified)  No partnership data on file   Family History  Problem Relation Age of Onset   Arthritis Mother        Severe-- RA   Diabetes Mother    Hyperlipidemia Mother    High blood pressure Mother    Hypertension Mother    Obesity Mother    Diabetes Father    Hyperlipidemia Father    High blood pressure Father    Hypertension Father    Liver disease Father    Diabetes Brother    Hypertension Brother    HIV/AIDS Brother    Hypertension  Brother    Drug abuse Brother        chf-- crack / cocaine   Congestive Heart Failure Brother    Heart disease Maternal Grandmother    Breast cancer Paternal Grandmother    Breast cancer Maternal Aunt    Arthritis Maternal Aunt        RA   Heart failure Maternal Aunt    Arthritis Maternal Aunt    Arthritis Maternal Aunt    Colon cancer Paternal Uncle    Congestive Heart Failure Paternal Uncle    Stroke Paternal Uncle    Colon polyps Neg Hx    Rectal cancer Neg Hx    Stomach cancer Neg Hx    No Known Allergies    Review of Systems  Constitutional:  Negative for chills, fever and malaise/fatigue.  HENT:  Negative for congestion and hearing loss.   Eyes:  Negative for blurred vision and discharge.  Respiratory:  Negative for cough, sputum production and shortness of breath.   Cardiovascular:  Negative for chest pain, palpitations and leg swelling.  Gastrointestinal:  Negative for abdominal pain, blood in stool, constipation, diarrhea, heartburn, nausea and vomiting.  Genitourinary:  Negative for dysuria, frequency, hematuria and urgency.  Musculoskeletal:  Negative for back pain, falls and myalgias.  Skin:  Negative for rash.  Neurological:  Negative for dizziness, sensory change, loss of consciousness, weakness and headaches.  Endo/Heme/Allergies:  Negative for environmental allergies. Does not bruise/bleed easily.  Psychiatric/Behavioral:  Negative for depression and suicidal ideas. The patient is not nervous/anxious and does not have insomnia.       Objective:     BP 120/60 (BP Location: Left Arm, Patient Position: Sitting, Cuff Size: Large)   Pulse (!) 41   Temp 98.5 F (36.9 C) (Oral)   Resp 18   Ht 5' 9.75" (1.772 m)   Wt 239 lb 12.8 oz (108.8 kg)   SpO2 97%   BMI 34.65 kg/m  BP Readings from Last 3 Encounters:  10/06/23  120/60  02/24/23 130/82  10/04/22 110/70   Wt Readings from Last 3 Encounters:  10/06/23 239 lb 12.8 oz (108.8 kg)  02/24/23 244 lb 9.6  oz (110.9 kg)  10/04/22 240 lb 6.4 oz (109 kg)   SpO2 Readings from Last 3 Encounters:  10/06/23 97%  02/24/23 97%  10/04/22 97%      Physical Exam Vitals and nursing note reviewed.  Constitutional:      General: He is not in acute distress.    Appearance: Normal appearance. He is well-developed.  HENT:     Head: Normocephalic and atraumatic.     Right Ear: Tympanic membrane, ear canal and external ear normal. There is no impacted cerumen.     Left Ear: Tympanic membrane, ear canal and external ear normal. There is no impacted cerumen.     Nose: Nose normal.     Mouth/Throat:     Mouth: Mucous membranes are moist.     Pharynx: Oropharynx is clear. No oropharyngeal exudate or posterior oropharyngeal erythema.  Eyes:     General: No scleral icterus.       Right eye: No discharge.        Left eye: No discharge.     Conjunctiva/sclera: Conjunctivae normal.     Pupils: Pupils are equal, round, and reactive to light.  Neck:     Thyroid: No thyromegaly.     Vascular: No JVD.  Cardiovascular:     Rate and Rhythm: Normal rate and regular rhythm.     Heart sounds: Normal heart sounds. No murmur heard. Pulmonary:     Effort: Pulmonary effort is normal. No respiratory distress.     Breath sounds: Normal breath sounds.  Abdominal:     General: Bowel sounds are normal. There is no distension.     Palpations: Abdomen is soft. There is no mass.     Tenderness: There is no abdominal tenderness. There is no guarding or rebound.  Musculoskeletal:        General: Normal range of motion.     Cervical back: Normal range of motion and neck supple.     Right lower leg: No edema.     Left lower leg: No edema.  Lymphadenopathy:     Cervical: No cervical adenopathy.  Skin:    General: Skin is warm and dry.     Findings: No erythema or rash.  Neurological:     Mental Status: He is alert and oriented to person, place, and time.     Cranial Nerves: No cranial nerve deficit.     Motor: No  abnormal muscle tone.     Deep Tendon Reflexes: Reflexes are normal and symmetric. Reflexes normal.  Psychiatric:        Mood and Affect: Mood normal.        Behavior: Behavior normal.        Thought Content: Thought content normal.        Judgment: Judgment normal.      No results found for any visits on 10/06/23.  Last CBC Lab Results  Component Value Date   WBC 6.4 02/24/2023   HGB 14.5 02/24/2023   HCT 45.1 02/24/2023   MCV 85.0 02/24/2023   MCH 27.7 05/17/2021   RDW 15.5 02/24/2023   PLT 181.0 02/24/2023   Last metabolic panel Lab Results  Component Value Date   GLUCOSE 127 (H) 06/03/2023   NA 138 06/03/2023   K 3.9 06/03/2023   CL 104 06/03/2023   CO2 26  06/03/2023   BUN 19 06/03/2023   CREATININE 1.29 06/03/2023   GFR 61.18 06/03/2023   CALCIUM 9.4 06/03/2023   PROT 6.9 06/03/2023   ALBUMIN 4.1 06/03/2023   LABGLOB 2.9 08/20/2018   AGRATIO 1.5 08/20/2018   BILITOT 0.7 06/03/2023   ALKPHOS 31 (L) 06/03/2023   AST 33 06/03/2023   ALT 44 06/03/2023   ANIONGAP 5 05/18/2021   Last lipids Lab Results  Component Value Date   CHOL 180 06/03/2023   HDL 41.10 06/03/2023   LDLCALC 120 (H) 06/03/2023   LDLDIRECT 127.0 03/18/2019   TRIG 95.0 06/03/2023   CHOLHDL 4 06/03/2023   Last hemoglobin A1c Lab Results  Component Value Date   HGBA1C 7.1 (H) 06/03/2023   Last thyroid functions Lab Results  Component Value Date   TSH 1.76 04/04/2022   T4TOTAL 7.5 08/25/2015   Last vitamin D Lab Results  Component Value Date   VD25OH 48.95 04/03/2021   Last vitamin B12 and Folate Lab Results  Component Value Date   VITAMINB12 743 08/20/2018   FOLATE 18.4 08/20/2018      The 10-year ASCVD risk score (Arnett DK, et al., 2019) is: 20.5%    Assessment & Plan:   Problem List Items Addressed This Visit       Unprioritized   Uncontrolled type 2 diabetes mellitus with hyperglycemia (HCC)    hgba1c to be checked ,  minimize simple carbs. Increase exercise  as tolerated. Continue current meds       Relevant Medications   Semaglutide,0.25 or 0.5MG /DOS, (OZEMPIC, 0.25 OR 0.5 MG/DOSE,) 2 MG/3ML SOPN   amLODipine-benazepril (LOTREL) 10-40 MG capsule   rosuvastatin (CRESTOR) 10 MG tablet   Hyperlipidemia LDL goal <100    Tolerating statin, encouraged heart healthy diet, avoid trans fats, minimize simple carbs and saturated fats. Increase exercise as tolerated       Relevant Medications   hydrochlorothiazide (HYDRODIURIL) 25 MG tablet   amLODipine-benazepril (LOTREL) 10-40 MG capsule   rosuvastatin (CRESTOR) 10 MG tablet   Hyperlipidemia    On zetia Start crestor 10 mg daily      Relevant Medications   hydrochlorothiazide (HYDRODIURIL) 25 MG tablet   amLODipine-benazepril (LOTREL) 10-40 MG capsule   rosuvastatin (CRESTOR) 10 MG tablet   HTN (hypertension) (Chronic)    Well controlled, no changes to meds. Encouraged heart healthy diet such as the DASH diet and exercise as tolerated.        Relevant Medications   hydrochlorothiazide (HYDRODIURIL) 25 MG tablet   amLODipine-benazepril (LOTREL) 10-40 MG capsule   rosuvastatin (CRESTOR) 10 MG tablet   Other Visit Diagnoses     Hyperlipidemia associated with type 2 diabetes mellitus (HCC)    -  Primary   Relevant Medications   Semaglutide,0.25 or 0.5MG /DOS, (OZEMPIC, 0.25 OR 0.5 MG/DOSE,) 2 MG/3ML SOPN   hydrochlorothiazide (HYDRODIURIL) 25 MG tablet   amLODipine-benazepril (LOTREL) 10-40 MG capsule   rosuvastatin (CRESTOR) 10 MG tablet   Other Relevant Orders   Lipid panel   Comprehensive metabolic panel   Microalbumin / creatinine urine ratio   Lipoprotein A (LPA)   PSA   Acute non-recurrent pansinusitis       Relevant Medications   fluticasone (FLONASE) 50 MCG/ACT nasal spray   Diabetes mellitus type II, non insulin dependent (HCC)       Relevant Medications   Semaglutide,0.25 or 0.5MG /DOS, (OZEMPIC, 0.25 OR 0.5 MG/DOSE,) 2 MG/3ML SOPN   amLODipine-benazepril (LOTREL) 10-40 MG  capsule   rosuvastatin (CRESTOR) 10 MG tablet  Other Relevant Orders   Lipid panel   Comprehensive metabolic panel   Hemoglobin A1c   Microalbumin / creatinine urine ratio   PSA   Essential hypertension       Relevant Medications   hydrochlorothiazide (HYDRODIURIL) 25 MG tablet   amLODipine-benazepril (LOTREL) 10-40 MG capsule   rosuvastatin (CRESTOR) 10 MG tablet   Other Relevant Orders   CBC with Differential/Platelet   PSA   Onychomycosis       Relevant Medications   Efinaconazole 10 % SOLN     Assessment and Plan    Cardiomegaly with abnormal diastolic function Patient reports heart does not open fully, potentially linked to PFAS exposure during [employer]. No current symptoms reported. -Continue current management and monitoring.  Hyperlipidemia High calcium score (99th percentile for age) on previous CT scan, indicating significant coronary artery disease. Patient has previously reported side effects with statin therapy. -Prescribe low-dose Rosuvastatin and advise patient to try taking it daily. If side effects occur, patient can reduce to three times per week. -Order LPA genetic test to assess if hyperlipidemia is primarily genetic.  Diabetes Patient reports inconsistent use of Ozempic. -Advise patient to use Ozempic as prescribed for optimal diabetes control.  Onychomycosis Patient reports persistent discoloration and thickening of toenails, likely fungal infection. Previous topical treatment did not fully resolve the issue. -Prescribe a newer topical antifungal medication for trial.  General Health Maintenance -Advise patient to schedule annual eye exam, particularly important due to diabetes. -Declined flu and shingles vaccines. -Order routine blood tests.        Return in about 6 months (around 04/04/2024), or if symptoms worsen or fail to improve, for hypertension, hyperlipidemia, diabetes II.    Donato Schultz, DO

## 2023-10-06 NOTE — Assessment & Plan Note (Signed)
On zetia Start crestor 10 mg daily

## 2023-10-06 NOTE — Patient Instructions (Signed)

## 2023-10-08 ENCOUNTER — Other Ambulatory Visit: Payer: Self-pay | Admitting: Family Medicine

## 2023-10-08 DIAGNOSIS — E119 Type 2 diabetes mellitus without complications: Secondary | ICD-10-CM

## 2023-10-08 DIAGNOSIS — I10 Essential (primary) hypertension: Secondary | ICD-10-CM

## 2023-10-08 DIAGNOSIS — E785 Hyperlipidemia, unspecified: Secondary | ICD-10-CM

## 2023-10-09 ENCOUNTER — Telehealth: Payer: Self-pay | Admitting: Family Medicine

## 2023-10-09 DIAGNOSIS — B351 Tinea unguium: Secondary | ICD-10-CM

## 2023-10-09 LAB — LIPOPROTEIN A (LPA): Lipoprotein (a): 110 nmol/L — ABNORMAL HIGH (ref <75)

## 2023-10-10 NOTE — Telephone Encounter (Signed)
Jublia not covered, preferred alternatives:   Clotrimazole Lozenge  Fluconazole Ketoconazole 2% Shampoo Terbinafine  Cicolpirox w/ PA and nail culture

## 2023-10-15 NOTE — Telephone Encounter (Signed)
Attempted to call patient. Phone continued to ring. Vm did not pick up.

## 2023-10-18 ENCOUNTER — Other Ambulatory Visit: Payer: Self-pay | Admitting: Family Medicine

## 2023-11-14 ENCOUNTER — Other Ambulatory Visit: Payer: Self-pay | Admitting: Family Medicine

## 2023-12-16 ENCOUNTER — Ambulatory Visit (HOSPITAL_BASED_OUTPATIENT_CLINIC_OR_DEPARTMENT_OTHER)
Admission: RE | Admit: 2023-12-16 | Discharge: 2023-12-16 | Disposition: A | Discharge status: Home / Self Care | Payer: 59 | Source: Ambulatory Visit | Attending: Family Medicine | Admitting: Family Medicine

## 2023-12-16 ENCOUNTER — Encounter: Payer: Self-pay | Admitting: Family Medicine

## 2023-12-16 ENCOUNTER — Ambulatory Visit: Payer: 59 | Admitting: Family Medicine

## 2023-12-16 VITALS — BP 98/70 | HR 75 | Temp 98.0°F | Resp 16 | Ht 69.75 in | Wt 242.4 lb

## 2023-12-16 DIAGNOSIS — M546 Pain in thoracic spine: Secondary | ICD-10-CM

## 2023-12-16 MED ORDER — MELOXICAM 15 MG PO TABS: 15.0000 mg | ORAL_TABLET | Freq: Every day | ORAL | 1 refills | Status: DC

## 2023-12-16 MED ORDER — METHOCARBAMOL 750 MG PO TABS: 750.0000 mg | ORAL_TABLET | Freq: Four times a day (QID) | ORAL | 1 refills | Status: DC

## 2023-12-16 NOTE — Progress Notes (Signed)
 Established Patient Office Visit  Subjective   Patient ID: Alfred Martin, male    DOB: 1966/05/13  Age: 58 y.o. MRN: [id number]  Chief Complaint  Patient presents with   Shoulder Pain    X1 month, left shoulder pain, no falls or injuries    HPI Discussed the use of AI scribe software for clinical note transcription with the patient, who gave verbal consent to proceed.  History of Present Illness   The patient, with a history of degenerative disc disease, presents with a persistent burning sensation located below the shoulder blade for approximately a month and a half. The discomfort is constant and does not worsen with movement. The patient denies any repetitive activities that could have triggered the pain, but mentions daily piano playing and computer use. The patient has tried various interventions including a mechanical massage device, which provided temporary relief, and a muscle relaxer (methocarbamol ), which offered some relief but was not entirely effective. The patient also took a single dose of tramadol , which alleviated the pain but caused undesirable drowsiness. The patient has not sought physical therapy due to work commitments and has concerns about chiropractic treatment due to existing nerve issues. The pain has started to affect the patient's sleep and overall quality of life.      Patient Active Problem List   Diagnosis Date Noted   Screening for colorectal cancer    Adenomatous polyp of ascending colon    Adenomatous polyp of transverse colon    Adenomatous polyp of sigmoid colon    Adenomatous polyp of descending colon    Rectal polyp    Diverticulosis of colon without hemorrhage    Grade I internal hemorrhoids    Type 2 diabetes mellitus without complication, without long-term current use of insulin  (HCC) 07/31/2021   Obesity (BMI 30-39.9) 07/04/2021   Chronic HFrEF (heart failure with reduced ejection fraction) (HCC) 05/17/2021   Radiculopathy 05/17/2021   DM2  (diabetes mellitus, type 2) (HCC) 05/17/2021   Acute renal failure superimposed on stage 2 chronic kidney disease (HCC) 05/16/2021   Allergy    CHF (congestive heart failure) (HCC)    Gout    Hyperlipidemia    Joint pain    Urinary frequency 04/03/2021   Fecal occult blood test positive 04/03/2021   Hemorrhoids 04/03/2021   Uncontrolled type 2 diabetes mellitus with hyperglycemia (HCC) 03/23/2020   Olecranon bursitis, left elbow 07/29/2018   Class 2 severe obesity with serious comorbidity and body mass index (BMI) of 35.0 to 35.9 in adult (HCC) 05/23/2018   Gout of multiple sites 11/21/2017   Hyperlipidemia LDL goal <100 08/12/2017   Joint swelling 08/25/2015   Cardiomyopathy (HCC) 07/27/2015   Abnormal echocardiogram 07/27/2015   Dyspnea on exertion 06/20/2015   HTN (hypertension) 06/01/2015   Preventative health care 06/01/2015   Past Medical History:  Diagnosis Date   Abnormal echocardiogram 07/27/2015   Acute renal failure superimposed on stage 2 chronic kidney disease (HCC) 05/16/2021   Allergy    Cardiomyopathy (HCC) 07/27/2015   Overview:  Dilated.   Chronic HFrEF (heart failure with reduced ejection fraction) (HCC) 05/17/2021   Class 2 severe obesity with serious comorbidity and body mass index (BMI) of 35.0 to 35.9 in adult [facility]) 05/23/2018   DM2 (diabetes mellitus, type 2) (HCC) 05/17/2021   Dyspnea on exertion 06/20/2015   6 Minute Walk Test 06/20/15- 99% at rest, 97% at end of walk, 99% after 2 minutes rest. POatient walked 528 meters without stopping or incident.  No oxgenation limitation on this test.   Gout of multiple sites 11/21/2017   Guaiac positive stools 04/03/2021   Hemorrhoids 04/03/2021   HTN (hypertension)    Hyperlipidemia LDL goal <100 08/12/2017   Joint pain    Joint swelling 08/25/2015   Obesity (BMI 30-39.9) 07/04/2021   Olecranon bursitis, left elbow 07/29/2018   Preventative health care 06/01/2015   Radiculopathy 05/17/2021   Urinary  frequency 04/03/2021   Past Surgical History:  Procedure Laterality Date   COLONOSCOPY WITH PROPOFOL  N/A 09/12/2021   Procedure: COLONOSCOPY WITH PROPOFOL ;  Surgeon: San Sandor GAILS, DO;  Location: [facility];  Service: Gastroenterology;  Laterality: N/A;   POLYPECTOMY  09/12/2021   Procedure: POLYPECTOMY;  Surgeon: San Sandor GAILS, DO;  Location: [facility];  Service: Gastroenterology;;   SUBMUCOSAL TATTOO INJECTION  09/12/2021   Procedure: SUBMUCOSAL TATTOO INJECTION;  Surgeon: San Sandor GAILS, DO;  Location: [facility];  Service: Gastroenterology;;   TONSILLECTOMY     Social History   Tobacco Use   Smoking status: Never   Smokeless tobacco: Never  Vaping Use   Vaping status: Never Used  Substance Use Topics   Alcohol use: No    Alcohol/week: 0.0 standard drinks of alcohol   Drug use: No   Social History   Socioeconomic History   Marital status: Married    Spouse name: [name]   Number of children: 4   Years of education: Not on file   Highest education level: Master's degree (e.g., MA, MS, MEng, MEd, MSW, MBA)  Occupational History   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: [employer]    Comment: retail buyer  Tobacco Use   Smoking status: Never   Smokeless tobacco: Never  Vaping Use   Vaping status: Never Used  Substance and Sexual Activity   Alcohol use: No    Alcohol/week: 0.0 standard drinks of alcohol   Drug use: No   Sexual activity: Yes  Other Topics Concern   Not on file  Social History Narrative   Exercise-- jogging 3-4 days a week   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   Social Drivers of Health   Financial Resource Strain: Patient Declined (12/11/2023)   Overall Financial Resource Strain (CARDIA)    Difficulty of Paying Living Expenses: Patient declined  Food Insecurity: Unknown (12/11/2023)   Hunger Vital Sign    Worried About Running Out of Food in the Last Year: Never true    Ran Out of Food in the Last Year: Patient  declined  Transportation Needs: No Transportation Needs (12/11/2023)   PRAPARE - Administrator, Civil Service (Medical): No    Lack of Transportation (Non-Medical): No  Physical Activity: Sufficiently Active (12/11/2023)   Exercise Vital Sign    Days of Exercise per Week: 5 days    Minutes of Exercise per Session: 60 min  Stress: No Stress Concern Present (12/11/2023)   [employer] of Occupational Health - Occupational Stress Questionnaire    Feeling of Stress : Not at all  Social Connections: Unknown (12/11/2023)   Social Connection and Isolation Panel [NHANES]    Frequency of Communication with Friends and Family: Patient declined    Frequency of Social Gatherings with Friends and Family: Patient declined    Attends Religious Services: Patient declined    Database Administrator or Organizations: Yes    Attends Banker Meetings: Patient declined    Marital Status: Married  Catering Manager Violence: Not on file  Family Status  Relation Name Status   Mother  Alive   Father  Deceased at age 16       cirrhosis, hep c   Brother  Alive   Brother  Deceased   MGM  (Not Specified)   PGM  (Not Specified)   Mat Aunt  Alive   Mat Aunt  Alive   Mat Aunt  Alive   Pat Uncle  Deceased       chf,    Pat Uncle  Alive   Pat Uncle  Deceased at age 70       HIV, sepsis   Neg Hx  (Not Specified)  No partnership data on file   Family History  Problem Relation Age of Onset   Arthritis Mother        Severe-- RA   Diabetes Mother    Hyperlipidemia Mother    High blood pressure Mother    Hypertension Mother    Obesity Mother    Diabetes Father    Hyperlipidemia Father    High blood pressure Father    Hypertension Father    Liver disease Father    Diabetes Brother    Hypertension Brother    HIV/AIDS Brother    Hypertension Brother    Drug abuse Brother        chf-- crack / cocaine   Congestive Heart Failure Brother    Heart disease Maternal Grandmother     Breast cancer Paternal Grandmother    Breast cancer Maternal Aunt    Arthritis Maternal Aunt        RA   Heart failure Maternal Aunt    Arthritis Maternal Aunt    Arthritis Maternal Aunt    Colon cancer Paternal Uncle    Congestive Heart Failure Paternal Uncle    Stroke Paternal Uncle    Colon polyps Neg Hx    Rectal cancer Neg Hx    Stomach cancer Neg Hx    No Known Allergies    Review of Systems  Constitutional:  Negative for chills, fever and malaise/fatigue.  HENT:  Negative for congestion and hearing loss.   Eyes:  Negative for blurred vision and discharge.  Respiratory:  Negative for cough, sputum production and shortness of breath.   Cardiovascular:  Negative for chest pain, palpitations and leg swelling.  Gastrointestinal:  Negative for abdominal pain, blood in stool, constipation, diarrhea, heartburn, nausea and vomiting.  Genitourinary:  Negative for dysuria, frequency, hematuria and urgency.  Musculoskeletal:  Positive for joint pain. Negative for back pain, falls and myalgias.  Skin:  Negative for rash.  Neurological:  Negative for dizziness, sensory change, loss of consciousness, weakness and headaches.  Endo/Heme/Allergies:  Negative for environmental allergies. Does not bruise/bleed easily.  Psychiatric/Behavioral:  Negative for depression and suicidal ideas. The patient is not nervous/anxious and does not have insomnia.       Objective:     BP 98/70 (BP Location: Right Arm, Patient Position: Sitting, Cuff Size: Normal)   Pulse 75   Temp 98 F (36.7 C) (Oral)   Resp 16   Ht 5' 9.75 (1.772 m)   Wt 242 lb 6.4 oz (110 kg)   SpO2 96%   BMI 35.03 kg/m  BP Readings from Last 3 Encounters:  12/16/23 98/70  10/06/23 120/60  02/24/23 130/82   Wt Readings from Last 3 Encounters:  12/16/23 242 lb 6.4 oz (110 kg)  10/06/23 239 lb 12.8 oz (108.8 kg)  02/24/23 244 lb 9.6 oz (110.9 kg)  SpO2 Readings from Last 3 Encounters:  12/16/23 96%  10/06/23 97%   02/24/23 97%      Physical Exam Vitals and nursing note reviewed.  Constitutional:      General: He is in acute distress.     Appearance: Normal appearance. He is well-developed.  HENT:     Head: Normocephalic and atraumatic.  Eyes:     General: No scleral icterus.       Right eye: No discharge.        Left eye: No discharge.  Cardiovascular:     Rate and Rhythm: Normal rate and regular rhythm.     Heart sounds: No murmur heard. Pulmonary:     Effort: Pulmonary effort is normal. No respiratory distress.     Breath sounds: Normal breath sounds.  Musculoskeletal:        General: Tenderness present. Normal range of motion.     Left shoulder: Tenderness present. Normal range of motion. Normal strength.     Cervical back: Normal range of motion and neck supple.     Right lower leg: No edema.     Left lower leg: No edema.     Comments: Muscle spasm L trapezius    Skin:    General: Skin is warm and dry.  Neurological:     Mental Status: He is alert and oriented to person, place, and time.  Psychiatric:        Mood and Affect: Mood normal.        Behavior: Behavior normal.        Thought Content: Thought content normal.        Judgment: Judgment normal.      No results found for any visits on 12/16/23.  Last CBC Lab Results  Component Value Date   WBC 4.9 10/06/2023   HGB 14.4 10/06/2023   HCT 46.0 10/06/2023   MCV 87.2 10/06/2023   MCH 27.7 05/17/2021   RDW 15.2 10/06/2023   PLT 153.0 10/06/2023   Last metabolic panel Lab Results  Component Value Date   GLUCOSE 103 (H) 10/06/2023   NA 139 10/06/2023   K 4.0 10/06/2023   CL 102 10/06/2023   CO2 29 10/06/2023   BUN 21 10/06/2023   CREATININE 1.44 10/06/2023   GFR 53.49 (L) 10/06/2023   CALCIUM  9.7 10/06/2023   PROT 6.7 10/06/2023   ALBUMIN 4.1 10/06/2023   LABGLOB 2.9 08/20/2018   AGRATIO 1.5 08/20/2018   BILITOT 0.9 10/06/2023   ALKPHOS 30 (L) 10/06/2023   AST 25 10/06/2023   ALT 31 10/06/2023    ANIONGAP 5 05/18/2021   Last lipids Lab Results  Component Value Date   CHOL 176 10/06/2023   HDL 45.50 10/06/2023   LDLCALC 116 (H) 10/06/2023   LDLDIRECT 127.0 03/18/2019   TRIG 73.0 10/06/2023   CHOLHDL 4 10/06/2023   Last hemoglobin A1c Lab Results  Component Value Date   HGBA1C 6.3 10/06/2023   Last thyroid  functions Lab Results  Component Value Date   TSH 1.76 04/04/2022   T4TOTAL 7.5 08/25/2015   Last vitamin D  Lab Results  Component Value Date   VD25OH 48.95 04/03/2021   Last vitamin B12 and Folate Lab Results  Component Value Date   VITAMINB12 743 08/20/2018   FOLATE 18.4 08/20/2018      The 10-year ASCVD risk score (Arnett DK, et al., 2019) is: 13.9%    Assessment & Plan:   Problem List Items Addressed This Visit   None Visit Diagnoses  Thoracic spine pain    -  Primary   Relevant Medications   methocarbamol  (ROBAXIN -750) 750 MG tablet   meloxicam  (MOBIC ) 15 MG tablet   Other Relevant Orders   DG Thoracic Spine 2 View     Assessment and Plan    Thoracic Back Pain Chronic burning pain below the shoulder blade has persisted for 1.5 months, constant and not worsening with movement. There is no associated cough, abdominal pain, or weakness. The pain is likely musculoskeletal, possibly due to a muscle spasm pinching a nerve, with a differential diagnosis including thoracic spine issues such as degenerative disc disease. Concerns about lung cancer are noted due to family history, but there are no risk factors like smoking or asbestos exposure. Insurance requires conservative treatments before an MRI. Meloxicam , an anti-inflammatory, should not cause drowsiness, while methocarbamol , a muscle relaxer, can be taken at night to avoid daytime drowsiness. Deep tissue massage and physical therapy are discussed as potential relief measures. An x-ray of the thoracic spine is ordered. Methocarbamol  750 mg and meloxicam  are prescribed. Deep tissue massage is  recommended, along with the use of a tennis ball in a pillowcase for acupressure. Physical therapy is considered if pain persists.  Degenerative Disc Disease Degenerative disc disease in the neck likely contributes to thoracic pain. Previous severe neck pain required hospitalization and MRI. Current pain is less severe but persistent. Monitoring the response to the current treatment plan for thoracic back pain is necessary. Further imaging or specialist referral may be considered if symptoms persist or worsen.  General Health Maintenance Low blood pressure is noted today, with no other issues discussed. Blood pressure will be monitored as needed.  Follow-up Follow-up will occur after x-ray results are available, with adjustments to the treatment plan based on x-ray findings and response to current medications.        Return if symptoms worsen or fail to improve.    Pedrohenrique Mcconville R Lowne Chase, DO

## 2023-12-17 NOTE — Patient Instructions (Signed)
Shoulder Pain Many things can cause shoulder pain, including: An injury to the shoulder. Overuse of the shoulder. Arthritis. The source of the pain can be: Inflammation. An injury to the shoulder joint. An injury to a tendon, ligament, or bone. Follow these instructions at home: Pay attention to changes in your symptoms. Let your health care provider know about them. Follow these instructions to relieve your pain. If you have a removable sling: Wear the sling as told by your provider. Remove it only as told by your provider. Check the skin around the sling every day. Tell your provider about any concerns. Loosen the sling if your fingers tingle, become numb, or become cold. Keep the sling clean. If the sling is not waterproof: Do not let it get wet. Remove it to shower or bathe. Move your arm as little as possible, but keep your hand moving to prevent swelling. Managing pain, stiffness, and swelling  If told, put ice on the painful area. If you have a removable sling or immobilizer, remove it as told by your provider. Put ice in a plastic bag. Place a towel between your skin and the bag. Leave the ice on for 20 minutes, 2-3 times a day. If your skin turns bright red, remove the ice right away to prevent skin damage. The risk of damage is higher if you cannot feel pain, heat, or cold. Move your fingers often to reduce stiffness and swelling. Squeeze a soft ball or a foam pad as much as possible. This helps to keep the shoulder from swelling. It also helps to strengthen the arm. General instructions Take over-the-counter and prescription medicines only as told by your provider. Exercise may help with pain management. Perform exercises if told by your provider. You may be referred to a physical therapist to help in your recovery process. Keep all follow-up visits in order to avoid any type of permanent shoulder disability or chronic pain problems. Contact a health care provider  if: Your pain is not relieved with medicines. New pain develops in your arm, hand, or fingers. You loosen your sling and your arm, hand, or fingers remain tingly, numb, swollen, or painful. Get help right away if: Your arm, hand, or fingers turn white or blue. This information is not intended to replace advice given to you by your health care provider. Make sure you discuss any questions you have with your health care provider. Document Revised: 06/21/2022 Document Reviewed: 06/21/2022 Elsevier Patient Education  2024 Elsevier Inc.  

## 2023-12-19 ENCOUNTER — Encounter: Payer: Self-pay | Admitting: Family Medicine

## 2023-12-25 ENCOUNTER — Other Ambulatory Visit: Payer: Self-pay | Admitting: Family Medicine

## 2023-12-25 DIAGNOSIS — E119 Type 2 diabetes mellitus without complications: Secondary | ICD-10-CM

## 2024-01-19 ENCOUNTER — Other Ambulatory Visit: Payer: Self-pay

## 2024-01-19 MED ORDER — SEMAGLUTIDE (1 MG/DOSE) 4 MG/3ML ~~LOC~~ SOPN: 1.0000 mg | PEN_INJECTOR | SUBCUTANEOUS | 2 refills | Status: DC

## 2024-01-20 ENCOUNTER — Telehealth: Payer: Self-pay

## 2024-01-20 NOTE — Telephone Encounter (Signed)
PA initiated via Covermymeds; KEY: [id number]. Awaiting determination.

## 2024-01-20 NOTE — Telephone Encounter (Signed)
PA approved. Effective 01/20/2024 - 01/19/2027

## 2024-02-05 ENCOUNTER — Other Ambulatory Visit: Payer: Self-pay | Admitting: Family Medicine

## 2024-02-05 DIAGNOSIS — M546 Pain in thoracic spine: Secondary | ICD-10-CM

## 2024-02-06 ENCOUNTER — Other Ambulatory Visit: Payer: Self-pay | Admitting: Family Medicine

## 2024-02-06 ENCOUNTER — Encounter: Payer: Self-pay | Admitting: Family Medicine

## 2024-02-06 MED ORDER — LIDOCAINE 5 % EX PTCH
1.0000 | MEDICATED_PATCH | CUTANEOUS | 0 refills | Status: DC
Start: 2024-02-06 — End: 2024-05-11

## 2024-02-12 ENCOUNTER — Other Ambulatory Visit (HOSPITAL_COMMUNITY): Payer: Self-pay

## 2024-02-12 ENCOUNTER — Telehealth: Payer: Self-pay | Admitting: Pharmacy Technician

## 2024-02-12 NOTE — Telephone Encounter (Signed)
 Pharmacy Patient Advocate Encounter   Received notification from CoverMyMeds that prior authorization for LIDOCAINE 5% PATCH is required/requested.   Insurance verification completed.   The patient is insured through [facility] .   Per test claim: PA required; PA submitted to above mentioned insurance via CoverMyMeds Key/confirmation #/EOC [id number] Status is pending

## 2024-02-12 NOTE — Telephone Encounter (Signed)
 Pharmacy Patient Advocate Encounter  Received notification from [facility] that Prior Authorization for LIDOCAINE 5% PATCH has been APPROVED from 02/12/2024 to 05/14/2024. Ran test claim, Copay is $5.00. This test claim was processed through [facility]- copay amounts may vary at other pharmacies due to pharmacy/plan contracts, or as the patient moves through the different stages of their insurance plan.   PA #/Case ID/Reference #: [id number]

## 2024-03-18 ENCOUNTER — Other Ambulatory Visit: Payer: Self-pay | Admitting: Family Medicine

## 2024-03-18 DIAGNOSIS — M546 Pain in thoracic spine: Secondary | ICD-10-CM

## 2024-03-24 ENCOUNTER — Other Ambulatory Visit: Payer: Self-pay | Admitting: Family Medicine

## 2024-03-24 DIAGNOSIS — L819 Disorder of pigmentation, unspecified: Secondary | ICD-10-CM

## 2024-03-27 ENCOUNTER — Other Ambulatory Visit: Payer: Self-pay | Admitting: Family Medicine

## 2024-03-27 DIAGNOSIS — I10 Essential (primary) hypertension: Secondary | ICD-10-CM

## 2024-04-01 ENCOUNTER — Other Ambulatory Visit: Payer: Self-pay | Admitting: Family Medicine

## 2024-04-01 DIAGNOSIS — M546 Pain in thoracic spine: Secondary | ICD-10-CM

## 2024-04-05 ENCOUNTER — Ambulatory Visit: Payer: BC Managed Care – PPO | Admitting: Family Medicine

## 2024-04-09 ENCOUNTER — Ambulatory Visit (INDEPENDENT_AMBULATORY_CARE_PROVIDER_SITE_OTHER): Admitting: Family Medicine

## 2024-04-09 ENCOUNTER — Encounter: Payer: Self-pay | Admitting: Family Medicine

## 2024-04-09 VITALS — BP 114/60 | HR 84 | Temp 98.2°F | Resp 18 | Ht 69.76 in | Wt 253.6 lb

## 2024-04-09 DIAGNOSIS — E785 Hyperlipidemia, unspecified: Secondary | ICD-10-CM | POA: Diagnosis not present

## 2024-04-09 DIAGNOSIS — I10 Essential (primary) hypertension: Secondary | ICD-10-CM | POA: Diagnosis not present

## 2024-04-09 DIAGNOSIS — Z0000 Encounter for general adult medical examination without abnormal findings: Secondary | ICD-10-CM

## 2024-04-09 DIAGNOSIS — E7800 Pure hypercholesterolemia, unspecified: Secondary | ICD-10-CM

## 2024-04-09 DIAGNOSIS — E119 Type 2 diabetes mellitus without complications: Secondary | ICD-10-CM | POA: Diagnosis not present

## 2024-04-09 NOTE — Patient Instructions (Signed)

## 2024-04-09 NOTE — Progress Notes (Signed)
 Established Patient Office Visit  Subjective   Patient ID: Alfred Martin, male    DOB: [birth year]  Age: 58 y.o. MRN: [id number]  Chief Complaint  Patient presents with   Annual Exam    HPI Discussed the use of AI scribe software for clinical note transcription with the patient, who gave verbal consent to proceed.  History of Present Illness Alfred Martin is a 59 year old male who presents for an annual physical exam.  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 His work hours from 8:30 AM to 6:00 PM limit his time for physical activity. He plans to focus on weight loss and intends to return to the gym during the summer.  He has a history of nerve pain, with one significant episode in the past. Pain medications provide psychological relief, but muscle relaxers and TENS machines are more effective. 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 During a prior episode, his kidneys were affected, and an MRI was performed.  He mentions a recent scare with his vision, where he almost went blind, but currently, his vision is better. However, he cannot see up close or far away without glasses. He acknowledges the need to visit an eye doctor regularly.  He reports a new mole that recently appeared, describing it as feeling like a 'warty mole.' It is slightly warm and causes pressure when touched, unlike his other moles.  He declines vaccines for pneumonia and shingles, stating he does not want any vaccines.   Patient Active Problem List   Diagnosis Date Noted   Screening for colorectal cancer    Adenomatous polyp of ascending colon    Adenomatous polyp of transverse colon    Adenomatous polyp of sigmoid colon    Adenomatous polyp of descending colon    Rectal polyp    Diverticulosis of colon without hemorrhage     Grade I internal hemorrhoids    Type 2 diabetes mellitus without complication, without long-term current use of insulin  (HCC) 07/31/2021   Obesity (BMI 30-39.9) 07/04/2021   Chronic HFrEF (heart failure with reduced ejection fraction) (HCC) 05/17/2021   Radiculopathy 05/17/2021   DM2 (diabetes mellitus, type 2) (HCC) 05/17/2021   Acute renal failure superimposed on stage 2 chronic kidney disease (HCC) 05/16/2021   Allergy    CHF (congestive heart failure) (HCC)    Gout    Hyperlipidemia    Joint pain    Urinary frequency 04/03/2021   Fecal occult blood test positive 04/03/2021   Hemorrhoids 04/03/2021   Uncontrolled type 2 diabetes mellitus with hyperglycemia (HCC) 03/23/2020   Olecranon bursitis, left elbow 07/29/2018   Class 2 severe obesity with serious comorbidity and body mass index (BMI) of 35.0 to 35.9 in adult (HCC) 05/23/2018   Gout of multiple sites 11/21/2017   Hyperlipidemia LDL goal <100 08/12/2017   Joint swelling 08/25/2015   Cardiomyopathy (HCC) 07/27/2015   Abnormal echocardiogram 07/27/2015   Dyspnea on exertion 06/20/2015   HTN (hypertension) 06/01/2015   Preventative health care 06/01/2015   Past Medical History:  Diagnosis Date   Abnormal echocardiogram 07/27/2015   Acute renal failure superimposed on stage 2 chronic kidney disease (HCC) 05/16/2021   Allergy    Cardiomyopathy (HCC) 07/27/2015   Overview:  Dilated.   Chronic HFrEF (heart failure with reduced ejection fraction) (HCC) 05/17/2021  Class 2 severe obesity with serious comorbidity and body mass index (BMI) of 35.0 to 35.9 in adult [facility]) 05/23/2018   DM2 (diabetes mellitus, type 2) (HCC) 05/17/2021   Dyspnea on exertion 06/20/2015   6 Minute Walk Test 06/20/15- 99% at rest, 97% at end of walk, 99% after 2 minutes rest. POatient walked 528 meters without stopping or incident. No oxgenation limitation on this test.   Gout of multiple sites 11/21/2017   Guaiac positive stools 04/03/2021    Hemorrhoids 04/03/2021   HTN (hypertension)    Hyperlipidemia LDL goal <100 08/12/2017   Joint pain    Joint swelling 08/25/2015   Obesity (BMI 30-39.9) 07/04/2021   Olecranon bursitis, left elbow 07/29/2018   Preventative health care 06/01/2015   Radiculopathy 05/17/2021   Urinary frequency 04/03/2021   Past Surgical History:  Procedure Laterality Date   COLONOSCOPY WITH PROPOFOL  N/A 09/12/2021   Procedure: COLONOSCOPY WITH PROPOFOL ;  Surgeon: Annis Kinder, DO;  Location: [facility];  Service: Gastroenterology;  Laterality: N/A;   POLYPECTOMY  09/12/2021   Procedure: POLYPECTOMY;  Surgeon: Annis Kinder, DO;  Location: [facility];  Service: Gastroenterology;;   SUBMUCOSAL TATTOO INJECTION  09/12/2021   Procedure: SUBMUCOSAL TATTOO INJECTION;  Surgeon: Annis Kinder, DO;  Location: [facility];  Service: Gastroenterology;;   TONSILLECTOMY     Social History   Tobacco Use   Smoking status: Never   Smokeless tobacco: Never  Vaping Use   Vaping status: Never Used  Substance Use Topics   Alcohol use: No    Alcohol/week: 0.0 standard drinks of alcohol   Drug use: No   Social History   Socioeconomic History   Marital status: Married    Spouse name: [name]   Number of children: 4   Years of education: Not on file   Highest education level: Master's degree (e.g., MA, MS, MEng, MEd, MSW, MBA)  Occupational History   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: [employer]    Comment: Retail buyer  Tobacco Use   Smoking status: Never   Smokeless tobacco: Never  Vaping Use   Vaping status: Never Used  Substance and Sexual Activity   Alcohol use: No    Alcohol/week: 0.0 standard drinks of alcohol   Drug use: No   Sexual activity: Yes  Other Topics Concern   Not on file  Social History Narrative   Exercise-- jogging 3-4 days a week   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   Social Drivers of Health   Financial Resource Strain: Patient  Declined (12/11/2023)   Overall Financial Resource Strain (CARDIA)    Difficulty of Paying Living Expenses: Patient declined  Food Insecurity: Unknown (12/11/2023)   Hunger Vital Sign    Worried About Running Out of Food in the Last Year: Never true    Ran Out of Food in the Last Year: Patient declined  Transportation Needs: No Transportation Needs (12/11/2023)   PRAPARE - Administrator, Civil Service (Medical): No    Lack of Transportation (Non-Medical): No  Physical Activity: Sufficiently Active (12/11/2023)   Exercise Vital Sign    Days of Exercise per Week: 5 days    Minutes of Exercise per Session: 60 min  Stress: No Stress Concern Present (12/11/2023)   [employer] of Occupational Health - Occupational Stress Questionnaire    Feeling of Stress : Not at all  Social Connections: Unknown (12/11/2023)   Social Connection and Isolation Panel [NHANES]  Frequency of Communication with Friends and Family: Patient declined    Frequency of Social Gatherings with Friends and Family: Patient declined    Attends Religious Services: Patient declined    Database administrator or Organizations: Yes    Attends Engineer, structural: Patient declined    Marital Status: Married  Catering manager Violence: Not on file   Family Status  Relation Name Status   Mother  Alive   Father  Deceased at age 41       cirrhosis, hep c   Brother  Alive   Brother  Deceased   MGM  (Not Specified)   PGM  (Not Specified)   Mat Aunt  Alive   Mat Aunt  Alive   Mat Aunt  Alive   Pat Uncle  Deceased       chf,    Pat Uncle  Alive   Pat Uncle  Deceased at age 67       HIV, sepsis   Neg Hx  (Not Specified)  No partnership data on file   Family History  Problem Relation Age of Onset   Arthritis Mother        Severe-- RA   Diabetes Mother    Hyperlipidemia Mother    High blood pressure Mother    Hypertension Mother    Obesity Mother    Diabetes Father    Hyperlipidemia Father     High blood pressure Father    Hypertension Father    Liver disease Father    Diabetes Brother    Hypertension Brother    HIV/AIDS Brother    Hypertension Brother    Drug abuse Brother        chf-- crack / cocaine   Congestive Heart Failure Brother    Heart disease Maternal Grandmother    Breast cancer Paternal Grandmother    Breast cancer Maternal Aunt    Arthritis Maternal Aunt        RA   Heart failure Maternal Aunt    Arthritis Maternal Aunt    Arthritis Maternal Aunt    Colon cancer Paternal Uncle    Congestive Heart Failure Paternal Uncle    Stroke Paternal Uncle    Colon polyps Neg Hx    Rectal cancer Neg Hx    Stomach cancer Neg Hx    No Known Allergies    ROS    Objective:     BP 114/60 (BP Location: Left Arm, Patient Position: Sitting, Cuff Size: Large)   Pulse 84   Temp 98.2 F (36.8 C) (Oral)   Resp 18   Ht 5' 9.76" (1.772 m)   Wt 253 lb 9.6 oz (115 kg)   SpO2 97%   BMI 36.64 kg/m  BP Readings from Last 3 Encounters:  04/09/24 114/60  12/16/23 98/70  10/06/23 120/60   Wt Readings from Last 3 Encounters:  04/09/24 253 lb 9.6 oz (115 kg)  12/16/23 242 lb 6.4 oz (110 kg)  10/06/23 239 lb 12.8 oz (108.8 kg)   SpO2 Readings from Last 3 Encounters:  04/09/24 97%  12/16/23 96%  10/06/23 97%      Physical Exam   No results found for any visits on 04/09/24.  Last CBC Lab Results  Component Value Date   WBC 4.9 10/06/2023   HGB 14.4 10/06/2023   HCT 46.0 10/06/2023   MCV 87.2 10/06/2023   MCH 27.7 05/17/2021   RDW 15.2 10/06/2023   PLT 153.0 10/06/2023   Last metabolic  panel Lab Results  Component Value Date   GLUCOSE 103 (H) 10/06/2023   NA 139 10/06/2023   K 4.0 10/06/2023   CL 102 10/06/2023   CO2 29 10/06/2023   BUN 21 10/06/2023   CREATININE 1.44 10/06/2023   GFR 53.49 (L) 10/06/2023   CALCIUM  9.7 10/06/2023   PROT 6.7 10/06/2023   ALBUMIN 4.1 10/06/2023   LABGLOB 2.9 08/20/2018   AGRATIO 1.5 08/20/2018   BILITOT 0.9  10/06/2023   ALKPHOS 30 (L) 10/06/2023   AST 25 10/06/2023   ALT 31 10/06/2023   ANIONGAP 5 05/18/2021   Last lipids Lab Results  Component Value Date   CHOL 176 10/06/2023   HDL 45.50 10/06/2023   LDLCALC 116 (H) 10/06/2023   LDLDIRECT 127.0 03/18/2019   TRIG 73.0 10/06/2023   CHOLHDL 4 10/06/2023   Last hemoglobin A1c Lab Results  Component Value Date   HGBA1C 6.3 10/06/2023   Last thyroid  functions Lab Results  Component Value Date   TSH 1.76 04/04/2022   T4TOTAL 7.5 08/25/2015   Last vitamin D  Lab Results  Component Value Date   VD25OH 48.95 04/03/2021   Last vitamin B12 and Folate Lab Results  Component Value Date   VITAMINB12 743 08/20/2018   FOLATE 18.4 08/20/2018      The 10-year ASCVD risk score (Arnett DK, et al., 2019) is: 18.8%    Assessment & Plan:   Problem List Items Addressed This Visit       Unprioritized   Preventative health care - Primary   Ghm utd Check labs  See AVS Health Maintenance  Topic Date Due   COVID-19 Vaccine (1) Never done   OPHTHALMOLOGY EXAM  08/29/2022   HEMOGLOBIN A1C  04/04/2024   Zoster Vaccines- Shingrix (1 of 2) 07/10/2024 (Originally [birth year]/1984)   Pneumococcal Vaccine 43-6 Years old (1 of 2 - PCV) 04/09/2025 (Originally 01/23/1984)   INFLUENZA VACCINE  07/02/2024   Diabetic kidney evaluation - eGFR measurement  10/05/2024   Diabetic kidney evaluation - Urine ACR  10/05/2024   FOOT EXAM  10/05/2024   DTaP/Tdap/Td (2 - Td or Tdap) 05/31/2025   Colonoscopy  09/13/2031   Hepatitis C Screening  Completed   HIV Screening  Completed   HPV VACCINES  Aged Out   Meningococcal B Vaccine  Aged Out         Relevant Orders   Lipid panel   PSA   TSH   Comprehensive metabolic panel with GFR   CBC with Differential/Platelet   Hyperlipidemia LDL goal <100   Encourage heart healthy diet such as MIND or DASH diet, increase exercise, avoid trans fats, simple carbohydrates and processed foods, consider a krill or fish  or flaxseed oil cap daily.        Relevant Orders   Lipid panel   Comprehensive metabolic panel with GFR   Hyperlipidemia   Diet controlled       Other Visit Diagnoses       Essential hypertension       Relevant Orders   Comprehensive metabolic panel with GFR   CBC with Differential/Platelet     Diabetes mellitus type II, non insulin  dependent (HCC)       Relevant Orders   Comprehensive metabolic panel with GFR   Hemoglobin A1c   Microalbumin / creatinine urine ratio     Assessment and Plan Assessment & Plan Wellness Visit   During his routine wellness visit, weight gain and exercise challenges due to his work schedule and  degenerative disc disease were discussed. He was encouraged to engage in low-impact exercises such as using an elliptical, recumbent bike, or swimming. He declined pneumonia and shingles vaccines.  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 Pain management includes muscle relaxers and a TENS machine. He was advised to exercise caution to prevent exacerbation of neck pain.  Wart-like mole   A newly appeared wart-like mole was noted without significant discomfort. No immediate intervention is required unless it becomes bothersome. It should be monitored for changes or discomfort, and an appointment should be scheduled for removal if necessary.    No follow-ups on file.    Kiante Petrovich R Lowne Chase, DO

## 2024-04-09 NOTE — Assessment & Plan Note (Signed)
 Diet controlled.

## 2024-04-09 NOTE — Assessment & Plan Note (Signed)
 Encourage heart healthy diet such as MIND or DASH diet, increase exercise, avoid trans fats, simple carbohydrates and processed foods, consider a krill or fish or flaxseed oil cap daily.

## 2024-04-09 NOTE — Assessment & Plan Note (Signed)
 Ghm utd Check labs  See AVS Health Maintenance  Topic Date Due   COVID-19 Vaccine (1) Never done   OPHTHALMOLOGY EXAM  08/29/2022   HEMOGLOBIN A1C  04/04/2024   Zoster Vaccines- Shingrix (1 of 2) 07/10/2024 (Originally [birth year]/23/1984)   Pneumococcal Vaccine 21-59 Years old (1 of 2 - PCV) 04/09/2025 (Originally 01/[birth year]1984)   INFLUENZA VACCINE  07/02/2024   Diabetic kidney evaluation - eGFR measurement  10/05/2024   Diabetic kidney evaluation - Urine ACR  10/05/2024   FOOT EXAM  10/05/2024   DTaP/Tdap/Td (2 - Td or Tdap) 05/31/2025   Colonoscopy  09/13/2031   Hepatitis C Screening  Completed   HIV Screening  Completed   HPV VACCINES  Aged Out   Meningococcal B Vaccine  Aged Out

## 2024-04-10 ENCOUNTER — Other Ambulatory Visit: Payer: Self-pay | Admitting: Family Medicine

## 2024-04-10 LAB — CBC WITH DIFFERENTIAL/PLATELET
Absolute Lymphocytes: 2716 {cells}/uL (ref 850–3900)
Absolute Monocytes: 756 {cells}/uL (ref 200–950)
Basophils Absolute: 90 {cells}/uL (ref 0–200)
Basophils Relative: 1.6 %
Eosinophils Absolute: 151 {cells}/uL (ref 15–500)
Eosinophils Relative: 2.7 %
HCT: 43.5 % (ref 38.5–50.0)
Hemoglobin: 14 g/dL (ref 13.2–17.1)
MCH: 27.5 pg (ref 27.0–33.0)
MCHC: 32.2 g/dL (ref 32.0–36.0)
MCV: 85.3 fL (ref 80.0–100.0)
MPV: 12.6 fL — ABNORMAL HIGH (ref 7.5–12.5)
Monocytes Relative: 13.5 %
Neutro Abs: 1887 {cells}/uL (ref 1500–7800)
Neutrophils Relative %: 33.7 %
Platelets: 170 10*3/uL (ref 140–400)
RBC: 5.1 10*6/uL (ref 4.20–5.80)
RDW: 13.1 % (ref 11.0–15.0)
Total Lymphocyte: 48.5 %
WBC: 5.6 10*3/uL (ref 3.8–10.8)

## 2024-04-10 LAB — LIPID PANEL
Cholesterol: 190 mg/dL (ref <200)
HDL: 42 mg/dL (ref >=40)
LDL Cholesterol (Calc): 116 mg/dL — ABNORMAL HIGH
Non-HDL Cholesterol (Calc): 148 mg/dL — ABNORMAL HIGH (ref <130)
Total CHOL/HDL Ratio: 4.5 (calc) (ref <5.0)
Triglycerides: 198 mg/dL — ABNORMAL HIGH (ref <150)

## 2024-04-10 LAB — COMPREHENSIVE METABOLIC PANEL WITH GFR
AG Ratio: 1.9 (calc) (ref 1.0–2.5)
ALT: 55 U/L — ABNORMAL HIGH (ref 9–46)
AST: 38 U/L — ABNORMAL HIGH (ref 10–35)
Albumin: 4.4 g/dL (ref 3.6–5.1)
Alkaline phosphatase (APISO): 35 U/L (ref 35–144)
BUN: 19 mg/dL (ref 7–25)
CO2: 28 mmol/L (ref 20–32)
Calcium: 9.5 mg/dL (ref 8.6–10.3)
Chloride: 103 mmol/L (ref 98–110)
Creat: 1.29 mg/dL (ref 0.70–1.30)
Globulin: 2.3 g/dL (ref 1.9–3.7)
Glucose, Bld: 122 mg/dL — ABNORMAL HIGH (ref 65–99)
Potassium: 4.1 mmol/L (ref 3.5–5.3)
Sodium: 140 mmol/L (ref 135–146)
Total Bilirubin: 0.5 mg/dL (ref 0.2–1.2)
Total Protein: 6.7 g/dL (ref 6.1–8.1)
eGFR: 64 mL/min/{1.73_m2} (ref >=60)

## 2024-04-10 LAB — PSA: PSA: 1.94 ng/mL (ref <=4.00)

## 2024-04-10 LAB — HEMOGLOBIN A1C
Hgb A1c MFr Bld: 8.9 % — ABNORMAL HIGH (ref <5.7)
Mean Plasma Glucose: 209 mg/dL
eAG (mmol/L): 11.6 mmol/L

## 2024-04-10 LAB — MICROALBUMIN / CREATININE URINE RATIO
Creatinine, Urine: 249 mg/dL (ref 20–320)
Microalb Creat Ratio: 4 mg/g{creat} (ref <30)
Microalb, Ur: 0.9 mg/dL

## 2024-04-10 LAB — TSH: TSH: 1.99 m[IU]/L (ref 0.40–4.50)

## 2024-04-11 ENCOUNTER — Other Ambulatory Visit: Payer: Self-pay | Admitting: Family Medicine

## 2024-05-10 ENCOUNTER — Other Ambulatory Visit: Payer: Self-pay | Admitting: Family Medicine

## 2024-05-21 ENCOUNTER — Other Ambulatory Visit: Payer: Self-pay | Admitting: Family Medicine

## 2024-05-21 DIAGNOSIS — M546 Pain in thoracic spine: Secondary | ICD-10-CM

## 2024-05-25 ENCOUNTER — Other Ambulatory Visit: Payer: Self-pay | Admitting: Family Medicine

## 2024-05-25 DIAGNOSIS — I10 Essential (primary) hypertension: Secondary | ICD-10-CM

## 2024-06-07 ENCOUNTER — Other Ambulatory Visit: Payer: Self-pay | Admitting: Family Medicine

## 2024-07-04 ENCOUNTER — Other Ambulatory Visit: Payer: Self-pay | Admitting: Family Medicine

## 2024-07-04 DIAGNOSIS — M546 Pain in thoracic spine: Secondary | ICD-10-CM

## 2024-07-06 ENCOUNTER — Other Ambulatory Visit: Payer: Self-pay | Admitting: Family Medicine

## 2024-07-27 ENCOUNTER — Encounter: Payer: Self-pay | Admitting: Family Medicine

## 2024-07-27 LAB — HM DIABETES EYE EXAM

## 2024-07-28 ENCOUNTER — Other Ambulatory Visit: Payer: Self-pay | Admitting: Family Medicine

## 2024-07-28 ENCOUNTER — Encounter: Payer: Self-pay | Admitting: *Deleted

## 2024-07-28 DIAGNOSIS — M546 Pain in thoracic spine: Secondary | ICD-10-CM

## 2024-08-10 ENCOUNTER — Other Ambulatory Visit: Payer: Self-pay | Admitting: Family Medicine

## 2024-08-10 DIAGNOSIS — L819 Disorder of pigmentation, unspecified: Secondary | ICD-10-CM

## 2024-08-21 ENCOUNTER — Other Ambulatory Visit: Payer: Self-pay | Admitting: Family Medicine

## 2024-08-21 DIAGNOSIS — I10 Essential (primary) hypertension: Secondary | ICD-10-CM

## 2024-09-02 ENCOUNTER — Other Ambulatory Visit: Payer: Self-pay | Admitting: Family Medicine

## 2024-09-02 ENCOUNTER — Encounter: Payer: Self-pay | Admitting: Family Medicine

## 2024-09-02 DIAGNOSIS — I420 Dilated cardiomyopathy: Secondary | ICD-10-CM

## 2024-09-02 DIAGNOSIS — I10 Essential (primary) hypertension: Secondary | ICD-10-CM

## 2024-09-02 DIAGNOSIS — I5020 Unspecified systolic (congestive) heart failure: Secondary | ICD-10-CM

## 2024-09-03 ENCOUNTER — Other Ambulatory Visit: Payer: Self-pay | Admitting: Family Medicine

## 2024-09-03 DIAGNOSIS — I10 Essential (primary) hypertension: Secondary | ICD-10-CM

## 2024-09-03 MED ORDER — HYDROCHLOROTHIAZIDE 25 MG PO TABS: 25.0000 mg | ORAL_TABLET | Freq: Every day | ORAL | 1 refills | Status: AC

## 2024-09-03 NOTE — Telephone Encounter (Signed)
 Looks like we referred him to Cardio in 2023 for  Systolic CHF with reduced left ventricular function, NYHA class 2 (HCC)  I Dilated cardiomyopathy (HCC)   Okay to place new referral?

## 2024-09-26 ENCOUNTER — Other Ambulatory Visit: Payer: Self-pay | Admitting: Family Medicine

## 2024-09-26 DIAGNOSIS — E1169 Type 2 diabetes mellitus with other specified complication: Secondary | ICD-10-CM

## 2024-09-28 ENCOUNTER — Other Ambulatory Visit: Payer: Self-pay | Admitting: Family Medicine

## 2024-09-28 DIAGNOSIS — M546 Pain in thoracic spine: Secondary | ICD-10-CM

## 2024-10-12 ENCOUNTER — Ambulatory Visit: Admitting: Family Medicine

## 2024-10-12 ENCOUNTER — Encounter: Payer: Self-pay | Admitting: Family Medicine

## 2024-10-12 VITALS — BP 146/74 | HR 96 | Resp 18 | Ht 69.0 in | Wt 242.6 lb

## 2024-10-12 DIAGNOSIS — E119 Type 2 diabetes mellitus without complications: Secondary | ICD-10-CM

## 2024-10-12 DIAGNOSIS — I10 Essential (primary) hypertension: Secondary | ICD-10-CM | POA: Diagnosis not present

## 2024-10-12 DIAGNOSIS — I5020 Unspecified systolic (congestive) heart failure: Secondary | ICD-10-CM | POA: Diagnosis not present

## 2024-10-12 DIAGNOSIS — E785 Hyperlipidemia, unspecified: Secondary | ICD-10-CM | POA: Diagnosis not present

## 2024-10-12 DIAGNOSIS — I420 Dilated cardiomyopathy: Secondary | ICD-10-CM | POA: Diagnosis not present

## 2024-10-12 DIAGNOSIS — E1169 Type 2 diabetes mellitus with other specified complication: Secondary | ICD-10-CM | POA: Diagnosis not present

## 2024-10-12 DIAGNOSIS — Z7985 Long-term (current) use of injectable non-insulin antidiabetic drugs: Secondary | ICD-10-CM

## 2024-10-12 MED ORDER — OZEMPIC (1 MG/DOSE) 4 MG/3ML ~~LOC~~ SOPN: PEN_INJECTOR | SUBCUTANEOUS | 3 refills | Status: DC

## 2024-10-12 MED ORDER — METOPROLOL SUCCINATE ER 25 MG PO TB24: 25.0000 mg | ORAL_TABLET | Freq: Every day | ORAL | 0 refills | Status: AC

## 2024-10-12 MED ORDER — ROSUVASTATIN CALCIUM 10 MG PO TABS: 10.0000 mg | ORAL_TABLET | Freq: Every day | ORAL | 0 refills | Status: AC

## 2024-10-12 NOTE — Progress Notes (Signed)
 Subjective:    Patient ID: Alfred Martin, male    DOB: Aug 02, 1966, 58 y.o.   MRN: [id number]  Chief Complaint  Patient presents with   Follow-up   skin check    Mole on right breast    Diabetes   Hypertension   Hyperlipidemia    HPI Patient is in today for f/u dm , htn , lipid.   Discussed the use of AI scribe software for clinical note transcription with the patient, who gave verbal consent to proceed.  History of Present Illness Alfred Martin is a 58 year old male with diabetes who presents with concerns about a bothersome mole and diabetes management.  He has a mole that has been present for about six months, which he describes as having a 'head' on it. It was smaller during his last visit six months ago.  He has diabetes and mentions the need to follow up with his diabetes doctor. He is mindful of his diet, stating he watches what he eats most days. His blood sugar levels are generally well-controlled, although he experienced a higher reading after eating a Whopper. His blood sugar is often low due to eating only once a day because of his busy work schedule, leading to some dizzy spells. He is currently taking Ozempic  and mentions spironolactone  prescribed by another doctor.  He discusses his son Alfred Martin's mental health issues, noting that Alfred Martin refuses to seek help and exhibits concerning behaviors such as sleeping in his car, quitting his job, and having delusions. Alfred Martin has previously damaged personal items and displayed erratic behavior. Alfred Martin's mental health issues began after a possible drug-related incident in college.    Past Medical History:  Diagnosis Date   Abnormal echocardiogram 07/27/2015   Acute renal failure superimposed on stage 2 chronic kidney disease 05/16/2021   Allergy    Cardiomyopathy (HCC) 07/27/2015   Overview:  Dilated.   Chronic HFrEF (heart failure with reduced ejection fraction) (HCC) 05/17/2021   Class 2 severe obesity with serious comorbidity  and body mass index (BMI) of 35.0 to 35.9 in adult 05/23/2018   DM2 (diabetes mellitus, type 2) (HCC) 05/17/2021   Dyspnea on exertion 06/20/2015   6 Minute Walk Test 06/20/15- 99% at rest, 97% at end of walk, 99% after 2 minutes rest. POatient walked 528 meters without stopping or incident. No oxgenation limitation on this test.   Gout of multiple sites 11/21/2017   Guaiac positive stools 04/03/2021   Hemorrhoids 04/03/2021   HTN (hypertension)    Hyperlipidemia LDL goal <100 08/12/2017   Joint pain    Joint swelling 08/25/2015   Obesity (BMI 30-39.9) 07/04/2021   Olecranon bursitis, left elbow 07/29/2018   Preventative health care 06/01/2015   Radiculopathy 05/17/2021   Urinary frequency 04/03/2021    Past Surgical History:  Procedure Laterality Date   COLONOSCOPY WITH PROPOFOL  N/A 09/12/2021   Procedure: COLONOSCOPY WITH PROPOFOL ;  Surgeon: San Sandor GAILS, DO;  Location: [facility];  Service: Gastroenterology;  Laterality: N/A;   POLYPECTOMY  09/12/2021   Procedure: POLYPECTOMY;  Surgeon: San Sandor GAILS, DO;  Location: [facility];  Service: Gastroenterology;;   SUBMUCOSAL TATTOO INJECTION  09/12/2021   Procedure: SUBMUCOSAL TATTOO INJECTION;  Surgeon: San Sandor GAILS, DO;  Location: [facility];  Service: Gastroenterology;;   TONSILLECTOMY      Family History  Problem Relation Age of Onset   Arthritis Mother        Severe-- RA   Diabetes Mother    Hyperlipidemia  Mother    High blood pressure Mother    Hypertension Mother    Obesity Mother    Diabetes Father    Hyperlipidemia Father    High blood pressure Father    Hypertension Father    Liver disease Father    Diabetes Brother    Hypertension Brother    HIV/AIDS Brother    Hypertension Brother    Drug abuse Brother        chf-- crack / cocaine   Congestive Heart Failure Brother    Heart disease Maternal Grandmother    Breast cancer Paternal Grandmother    Breast cancer Maternal Aunt    Arthritis  Maternal Aunt        RA   Heart failure Maternal Aunt    Arthritis Maternal Aunt    Arthritis Maternal Aunt    Colon cancer Paternal Uncle    Congestive Heart Failure Paternal Uncle    Stroke Paternal Uncle    Colon polyps Neg Hx    Rectal cancer Neg Hx    Stomach cancer Neg Hx     Social History   Socioeconomic History   Marital status: Married    Spouse name: Alfred Martin   Number of children: 4   Years of education: Not on file   Highest education level: Master's degree (e.g., MA, MS, MEng, MEd, MSW, MBA)  Occupational History   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: [employer]    Comment: retail buyer  Tobacco Use   Smoking status: Never   Smokeless tobacco: Never  Vaping Use   Vaping status: Never Used  Substance and Sexual Activity   Alcohol use: No    Alcohol/week: 0.0 standard drinks of alcohol   Drug use: No   Sexual activity: Yes  Other Topics Concern   Not on file  Social History Narrative   Exercise-- jogging 3-4 days a week   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   Social Drivers of Health   Financial Resource Strain: Patient Declined (10/11/2024)   Overall Financial Resource Strain (CARDIA)    Difficulty of Paying Living Expenses: Patient declined  Food Insecurity: Patient Declined (10/11/2024)   Hunger Vital Sign    Worried About Running Out of Food in the Last Year: Patient declined    Ran Out of Food in the Last Year: Patient declined  Transportation Needs: Patient Declined (10/11/2024)   PRAPARE - Administrator, Civil Service (Medical): Patient declined    Lack of Transportation (Non-Medical): Patient declined  Physical Activity: Insufficiently Active (10/11/2024)   Exercise Vital Sign    Days of Exercise per Week: 1 day    Minutes of Exercise per Session: 100 min  Stress: No Stress Concern Present (10/11/2024)   [employer] of Occupational Health - Occupational Stress Questionnaire    Feeling of Stress: Only a  little  Social Connections: Unknown (10/11/2024)   Social Connection and Isolation Panel    Frequency of Communication with Friends and Family: Patient declined    Frequency of Social Gatherings with Friends and Family: Patient declined    Attends Religious Services: Patient declined    Database Administrator or Organizations: Patient declined    Attends Engineer, Structural: Not on file    Marital Status: Married  Catering Manager Violence: Not on file    Outpatient Medications Prior to Visit  Medication Sig Dispense Refill   ACCU-CHEK GUIDE test strip USE AS DIRECTED UP TO 4 TIMES  A DAY TO CHECK BLOOD SUGAR 100 strip 1   Accu-Chek Softclix Lancets lancets USE TO CHECK GLUCOSE THREE TIMES DAILY 100 each 1   allopurinol  (ZYLOPRIM ) 300 MG tablet TAKE 1 TABLET BY MOUTH EVERY DAY 90 tablet 2   amLODipine -benazepril  (LOTREL) 10-40 MG capsule TAKE 1 CAPSULE BY MOUTH EVERY DAY 90 capsule 0   aspirin  EC 81 MG tablet Take 1 tablet (81 mg total) by mouth daily. Swallow whole. 90 tablet 3   azelastine  (ASTELIN ) 0.1 % nasal spray Place 1 spray into both nostrils 2 (two) times daily. Use in each nostril as directed 30 mL 12   dapagliflozin  propanediol (FARXIGA ) 10 MG TABS tablet Take 1 tablet (10 mg total) by mouth daily before breakfast. 30 tablet 11   Efinaconazole  10 % SOLN Apply every day x 48 weeks 8 mL 2   ENTRESTO  24-26 MG Take 1 tablet by mouth 2 (two) times daily.     ezetimibe  (ZETIA ) 10 MG tablet TAKE 1 TABLET BY MOUTH EVERY DAY 90 tablet 1   fluticasone  (FLONASE ) 50 MCG/ACT nasal spray Place 2 sprays into both nostrils daily. 48 mL 5   hydrochlorothiazide  (HYDRODIURIL ) 25 MG tablet Take 1 tablet (25 mg total) by mouth daily. 90 tablet 1   hydroquinone  4 % cream APPLY TO AFFECTED AREA TWICE A DAY 28.35 g 1   levocetirizine (XYZAL ) 5 MG tablet TAKE 1 TABLET BY MOUTH EVERY DAY IN THE EVENING 90 tablet 1   lidocaine  (LIDODERM ) 5 % PLACE 1 PATCH ONTO THE SKIN DAILY. REMOVE & DISCARD  PATCH WITHIN 12 HOURS OR AS DIRECTED BY MD 30 patch 0   meloxicam  (MOBIC ) 15 MG tablet TAKE 1 TABLET (15 MG TOTAL) BY MOUTH DAILY. 30 tablet 2   methocarbamol  (ROBAXIN ) 750 MG tablet TAKE 1 TABLET BY MOUTH 4 TIMES DAILY. 120 tablet 1   sacubitril -valsartan  (ENTRESTO ) 24-26 MG Take 1 tablet by mouth 2 (two) times daily. 60 tablet 11   sildenafil  (VIAGRA ) 100 MG tablet Take 0.5-1 tablets (50-100 mg total) by mouth daily as needed for erectile dysfunction. 10 tablet 11   spironolactone  (ALDACTONE ) 25 MG tablet Take 0.5 tablets (12.5 mg total) by mouth daily. 45 tablet 3   methocarbamol  (ROBAXIN ) 500 MG tablet Take 1 tablet (500 mg total) by mouth every 6 (six) hours as needed for muscle spasms. 90 tablet 3   metoprolol  succinate (TOPROL -XL) 25 MG 24 hr tablet Take 1 tablet (25 mg total) by mouth daily. Take with or immediately following a meal 90 tablet 0   rosuvastatin  (CRESTOR ) 10 MG tablet Take 1 tablet (10 mg total) by mouth daily. 90 tablet 0   rosuvastatin  (CRESTOR ) 40 MG tablet Take 1 tablet (40 mg total) by mouth daily. 90 tablet 1   Semaglutide , 1 MG/DOSE, (OZEMPIC , 1 MG/DOSE,) 4 MG/3ML SOPN INJECT 1 MG ONCE A WEEK AS DIRECTED 9 mL 3   No facility-administered medications prior to visit.    No Known Allergies  Review of Systems  Constitutional:  Negative for fever and malaise/fatigue.  HENT:  Negative for congestion.   Eyes:  Negative for blurred vision.  Respiratory:  Negative for cough and shortness of breath.   Cardiovascular:  Negative for chest pain, palpitations and leg swelling.  Gastrointestinal:  Negative for abdominal pain, blood in stool, nausea and vomiting.  Genitourinary:  Negative for dysuria and frequency.  Musculoskeletal:  Negative for back pain and falls.  Skin:  Negative for rash.  Neurological:  Negative for dizziness, loss of  consciousness and headaches.  Endo/Heme/Allergies:  Negative for environmental allergies.  Psychiatric/Behavioral:  Negative for  depression. The patient is not nervous/anxious.        Objective:    Physical Exam Vitals and nursing note reviewed.  Constitutional:      General: He is not in acute distress.    Appearance: Normal appearance. He is well-developed.  HENT:     Head: Normocephalic and atraumatic.  Eyes:     General: No scleral icterus.       Right eye: No discharge.        Left eye: No discharge.  Cardiovascular:     Rate and Rhythm: Normal rate and regular rhythm.     Heart sounds: No murmur heard. Pulmonary:     Effort: Pulmonary effort is normal. No respiratory distress.     Breath sounds: Normal breath sounds.  Musculoskeletal:        General: Normal range of motion.     Cervical back: Normal range of motion and neck supple.     Right lower leg: No edema.     Left lower leg: No edema.  Skin:    General: Skin is warm and dry.  Neurological:     Mental Status: He is alert and oriented to person, place, and time.  Psychiatric:        Mood and Affect: Mood normal.        Behavior: Behavior normal.        Thought Content: Thought content normal.        Judgment: Judgment normal.     BP (!) 146/74   Pulse 96   Resp 18   Ht 5' 9 (1.753 m)   Wt 242 lb 9.6 oz (110 kg)   SpO2 97%   BMI 35.83 kg/m  Wt Readings from Last 3 Encounters:  10/12/24 242 lb 9.6 oz (110 kg)  04/09/24 253 lb 9.6 oz (115 kg)  12/16/23 242 lb 6.4 oz (110 kg)    Diabetic Foot Exam - Simple   No data filed    Lab Results  Component Value Date   WBC 5.6 04/09/2024   HGB 14.0 04/09/2024   HCT 43.5 04/09/2024   PLT 170 04/09/2024   GLUCOSE 122 (H) 04/09/2024   CHOL 190 04/09/2024   TRIG 198 (H) 04/09/2024   HDL 42 04/09/2024   LDLDIRECT 127.0 03/18/2019   LDLCALC 116 (H) 04/09/2024   ALT 55 (H) 04/09/2024   AST 38 (H) 04/09/2024   NA 140 04/09/2024   K 4.1 04/09/2024   CL 103 04/09/2024   CREATININE 1.29 04/09/2024   BUN 19 04/09/2024   CO2 28 04/09/2024   TSH 1.99 04/09/2024   PSA 1.94  04/09/2024   HGBA1C 8.9 (H) 04/09/2024   MICROALBUR 0.9 04/09/2024    Lab Results  Component Value Date   TSH 1.99 04/09/2024   Lab Results  Component Value Date   WBC 5.6 04/09/2024   HGB 14.0 04/09/2024   HCT 43.5 04/09/2024   MCV 85.3 04/09/2024   PLT 170 04/09/2024   Lab Results  Component Value Date   NA 140 04/09/2024   K 4.1 04/09/2024   CO2 28 04/09/2024   GLUCOSE 122 (H) 04/09/2024   BUN 19 04/09/2024   CREATININE 1.29 04/09/2024   BILITOT 0.5 04/09/2024   ALKPHOS 30 (L) 10/06/2023   AST 38 (H) 04/09/2024   ALT 55 (H) 04/09/2024   PROT 6.7 04/09/2024   ALBUMIN 4.1 10/06/2023  CALCIUM  9.5 04/09/2024   ANIONGAP 5 05/18/2021   EGFR 64 04/09/2024   GFR 53.49 (L) 10/06/2023   Lab Results  Component Value Date   CHOL 190 04/09/2024   Lab Results  Component Value Date   HDL 42 04/09/2024   Lab Results  Component Value Date   LDLCALC 116 (H) 04/09/2024   Lab Results  Component Value Date   TRIG 198 (H) 04/09/2024   Lab Results  Component Value Date   CHOLHDL 4.5 04/09/2024   Lab Results  Component Value Date   HGBA1C 8.9 (H) 04/09/2024       Assessment & Plan:  Hyperlipidemia associated with type 2 diabetes mellitus (HCC) -     Lipid panel -     CBC with Differential/Platelet -     Comprehensive metabolic panel with GFR -     Rosuvastatin  Calcium ; Take 1 tablet (10 mg total) by mouth daily.  Dispense: 90 tablet; Refill: 0  Essential hypertension -     Hemoglobin A1c -     Microalbumin / creatinine urine ratio -     Metoprolol  Succinate ER; Take 1 tablet (25 mg total) by mouth daily. Take with or immediately following a meal  Dispense: 90 tablet; Refill: 0  Systolic CHF with reduced left ventricular function, NYHA class 2 (HCC) -     Hemoglobin A1c -     Microalbumin / creatinine urine ratio  Dilated cardiomyopathy (HCC)  Diabetes mellitus type II, non insulin  dependent (HCC) -     Comprehensive metabolic panel with GFR -      Hemoglobin A1c -     Microalbumin / creatinine urine ratio -     Ozempic  (1 MG/DOSE); 1 mg sq weekly  Dispense: 9 mL; Refill: 3   Assessment and Plan Assessment & Plan Skin lesion (mole)   A mole has been present for approximately six months, increasing in size with a head-like appearance. Scheduled an appointment for mole removal.  Type 2 diabetes mellitus   Blood glucose levels are generally well-controlled, with occasional hypoglycemic episodes due to reduced caloric intake. He is currently on Ozempic . Continue Ozempic  and schedule an appointment with a diabetes specialist.  Essential hypertension  Dilated cardiomyopathy and systolic heart failure   A follow-up with the cardiologist is due. Schedule an appointment with the cardiologist.  General Health Maintenance   No need for a flu shot at this time. Schedule appointments with the GI and cardiologist during Thanksgiving and Christmas breaks.   Niala Stcharles R Lowne Chase, DO

## 2024-10-13 ENCOUNTER — Encounter: Payer: Self-pay | Admitting: Family Medicine

## 2024-10-13 LAB — CBC WITH DIFFERENTIAL/PLATELET
Basophils Absolute: 0 K/uL (ref 0.0–0.1)
Basophils Relative: 0.6 % (ref 0.0–3.0)
Eosinophils Absolute: 0.1 K/uL (ref 0.0–0.7)
Eosinophils Relative: 2.6 % (ref 0.0–5.0)
HCT: 42.8 % (ref 39.0–52.0)
Hemoglobin: 14 g/dL (ref 13.0–17.0)
Lymphocytes Relative: 43.6 % (ref 12.0–46.0)
Lymphs Abs: 2.5 K/uL (ref 0.7–4.0)
MCHC: 32.7 g/dL (ref 30.0–36.0)
MCV: 84.5 fl (ref 78.0–100.0)
Monocytes Absolute: 0.8 K/uL (ref 0.1–1.0)
Monocytes Relative: 13.3 % — ABNORMAL HIGH (ref 3.0–12.0)
Neutro Abs: 2.3 K/uL (ref 1.4–7.7)
Neutrophils Relative %: 39.9 % — ABNORMAL LOW (ref 43.0–77.0)
Platelets: 144 K/uL — ABNORMAL LOW (ref 150.0–400.0)
RBC: 5.06 Mil/uL (ref 4.22–5.81)
RDW: 14.7 % (ref 11.5–15.5)
WBC: 5.8 K/uL (ref 4.0–10.5)

## 2024-10-13 LAB — COMPREHENSIVE METABOLIC PANEL WITH GFR
ALT: 35 U/L (ref 0–53)
AST: 28 U/L (ref 0–37)
Albumin: 4.2 g/dL (ref 3.5–5.2)
Alkaline Phosphatase: 33 U/L — ABNORMAL LOW (ref 39–117)
BUN: 25 mg/dL — ABNORMAL HIGH (ref 6–23)
CO2: 30 meq/L (ref 19–32)
Calcium: 9.3 mg/dL (ref 8.4–10.5)
Chloride: 101 meq/L (ref 96–112)
Creatinine, Ser: 1.43 mg/dL (ref 0.40–1.50)
GFR: 53.55 mL/min — ABNORMAL LOW (ref >60.00)
Glucose, Bld: 91 mg/dL (ref 70–99)
Potassium: 3.7 meq/L (ref 3.5–5.1)
Sodium: 141 meq/L (ref 135–145)
Total Bilirubin: 0.5 mg/dL (ref 0.2–1.2)
Total Protein: 6.8 g/dL (ref 6.0–8.3)

## 2024-10-13 LAB — MICROALBUMIN / CREATININE URINE RATIO
Creatinine,U: 170.9 mg/dL
Microalb Creat Ratio: 14.2 mg/g (ref 0.0–30.0)
Microalb, Ur: 2.4 mg/dL — ABNORMAL HIGH (ref 0.0–1.9)

## 2024-10-13 LAB — LIPID PANEL
Cholesterol: 175 mg/dL (ref 0–200)
HDL: 44.8 mg/dL (ref >39.00)
LDL Cholesterol: 87 mg/dL (ref 0–99)
NonHDL: 130.49
Total CHOL/HDL Ratio: 4
Triglycerides: 215 mg/dL — ABNORMAL HIGH (ref 0.0–149.0)
VLDL: 43 mg/dL — ABNORMAL HIGH (ref 0.0–40.0)

## 2024-10-13 LAB — HEMOGLOBIN A1C: Hgb A1c MFr Bld: 7 % — ABNORMAL HIGH (ref 4.6–6.5)

## 2024-10-21 ENCOUNTER — Ambulatory Visit: Payer: Self-pay | Admitting: Family Medicine

## 2024-10-26 ENCOUNTER — Other Ambulatory Visit: Payer: Self-pay | Admitting: Family Medicine

## 2024-10-26 DIAGNOSIS — M546 Pain in thoracic spine: Secondary | ICD-10-CM

## 2024-10-27 ENCOUNTER — Encounter: Payer: Self-pay | Admitting: Internal Medicine

## 2024-10-27 ENCOUNTER — Ambulatory Visit: Admitting: Internal Medicine

## 2024-10-27 DIAGNOSIS — E119 Type 2 diabetes mellitus without complications: Secondary | ICD-10-CM

## 2024-10-27 DIAGNOSIS — Z7985 Long-term (current) use of injectable non-insulin antidiabetic drugs: Secondary | ICD-10-CM

## 2024-10-27 MED ORDER — OZEMPIC (1 MG/DOSE) 4 MG/3ML ~~LOC~~ SOPN: PEN_INJECTOR | SUBCUTANEOUS | 3 refills | Status: AC

## 2024-10-27 NOTE — Patient Instructions (Signed)
 Continue Ozempic 1 mg weekly.

## 2024-10-27 NOTE — Progress Notes (Signed)
 Name: Alfred Martin  Age/ Sex: 58 y.o., male   MRN/ DOB: [id number], 1966-01-02     PCP: Antonio Cyndee Jamee JONELLE, DO   Reason for Endocrinology Evaluation: Type 2 Diabetes Mellitus  Initial Endocrine Consultative Visit: 05/01/2021    PATIENT IDENTIFIER: Mr. Alfred Martin is a 58 y.o. male with a past medical history of T2DM, HTN, Cardiomyopathy and CHF. The patient has followed with Endocrinology clinic since 05/01/2021 for consultative assistance with management of his diabetes.  DIABETIC HISTORY:  Mr. Harbeson was diagnosed with DM in 2020, he was prescribed Rybelsus  and Tresiba  in 04/2021 but did not start due to severe blurry vision. His hemoglobin A1c has ranged from 6.5% in 2020, peaking at 13.8% in 2022.   On his initial visit his A1c was 13.8%. He was not taking Rybelsus  due to blurry vision, he was also not taking Tresiba . His BG's have been in the low 100's after he made major lifestyle changes and we opted to monitor.   Repeat A1c 6.0% 07/2021  Patient was started on Ozempic  with an A1c of 8.9% in May, 2025  Patient on Farxiga  through cardiology  SUBJECTIVE:   During the last visit (01/2022): A1c 5.9%      Today (10/27/2024): Mr. Alfred Martin is here for a follow up on diabetes management.  He checks his blood sugars twice daily.    The patient has not been seen to our clinic in 33 months.  On his last visit with me in February, 2023 his A1c was 5.9%.  Over the past 2 years his A1c has trended up as high as 8.9% in May, 2025.  He was started on Ozempic  through PCPs office.  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  No nausea  No constipation or diarrhea  Has hemorrhoids issues    HOME DIABETES REGIMEN:  Farxiga  10 mg daily-through cardiology not taking  Ozempic  1 mg weekly   Statin: yes ACE-I/ARB: yes Prior Diabetic Education: no   METER DOWNLOAD SUMMARY: n/a   DIABETIC COMPLICATIONS: Microvascular  complications:   Denies: CKD, retinopathy, neuropathy Last Eye Exam: Completed   Macrovascular complications:   Denies: CAD, CVA, PVD   HISTORY:  Past Medical History:  Past Medical History:  Diagnosis Date   Abnormal echocardiogram 07/27/2015   Acute renal failure superimposed on stage 2 chronic kidney disease 05/16/2021   Allergy    Cardiomyopathy (HCC) 07/27/2015   Overview:  Dilated.   Chronic HFrEF (heart failure with reduced ejection fraction) (HCC) 05/17/2021   Class 2 severe obesity with serious comorbidity and body mass index (BMI) of 35.0 to 35.9 in adult 05/23/2018   DM2 (diabetes mellitus, type 2) (HCC) 05/17/2021   Dyspnea on exertion 06/20/2015   6 Minute Walk Test 06/20/15- 99% at rest, 97% at end of walk, 99% after 2 minutes rest. POatient walked 528 meters without stopping or incident. No oxgenation limitation on this test.   Gout of multiple sites 11/21/2017   Guaiac positive stools 04/03/2021   Hemorrhoids 04/03/2021   HTN (hypertension)    Hyperlipidemia LDL goal <100 08/12/2017   Joint pain    Joint swelling 08/25/2015   Obesity (BMI 30-39.9) 07/04/2021   Olecranon bursitis, left elbow 07/29/2018   Preventative health care 06/01/2015   Radiculopathy 05/17/2021   Urinary frequency 04/03/2021   Past Surgical History:  Past Surgical History:  Procedure Laterality Date   COLONOSCOPY WITH PROPOFOL  N/A 09/12/2021  Procedure: COLONOSCOPY WITH PROPOFOL ;  Surgeon: San Sandor GAILS, DO;  Location: [facility];  Service: Gastroenterology;  Laterality: N/A;   POLYPECTOMY  09/12/2021   Procedure: POLYPECTOMY;  Surgeon: San Sandor GAILS, DO;  Location: [facility];  Service: Gastroenterology;;   SUBMUCOSAL TATTOO INJECTION  09/12/2021   Procedure: SUBMUCOSAL TATTOO INJECTION;  Surgeon: San Sandor GAILS, DO;  Location: [facility];  Service: Gastroenterology;;   TONSILLECTOMY     Social History:  reports that he has never smoked. He has never used  smokeless tobacco. He reports that he does not drink alcohol and does not use drugs. Family History:  Family History  Problem Relation Age of Onset   Arthritis Mother        Severe-- RA   Diabetes Mother    Hyperlipidemia Mother    High blood pressure Mother    Hypertension Mother    Obesity Mother    Diabetes Father    Hyperlipidemia Father    High blood pressure Father    Hypertension Father    Liver disease Father    Diabetes Brother    Hypertension Brother    HIV/AIDS Brother    Hypertension Brother    Drug abuse Brother        chf-- crack / cocaine   Congestive Heart Failure Brother    Heart disease Maternal Grandmother    Breast cancer Paternal Grandmother    Breast cancer Maternal Aunt    Arthritis Maternal Aunt        RA   Heart failure Maternal Aunt    Arthritis Maternal Aunt    Arthritis Maternal Aunt    Colon cancer Paternal Uncle    Congestive Heart Failure Paternal Uncle    Stroke Paternal Uncle    Colon polyps Neg Hx    Rectal cancer Neg Hx    Stomach cancer Neg Hx      HOME MEDICATIONS: Allergies as of 10/27/2024   No Known Allergies      Medication List        Accurate as of October 27, 2024 11:44 AM. If you have any questions, ask your nurse or doctor.          STOP taking these medications    Entresto  24-26 MG Generic drug: sacubitril -valsartan  Stopped by: Merissa Renwick J Joaopedro Eschbach       TAKE these medications    Accu-Chek Guide test strip Generic drug: glucose blood USE AS DIRECTED UP TO 4 TIMES A DAY TO CHECK BLOOD SUGAR   Accu-Chek Softclix Lancets lancets USE TO CHECK GLUCOSE THREE TIMES DAILY   allopurinol  300 MG tablet Commonly known as: ZYLOPRIM  TAKE 1 TABLET BY MOUTH EVERY DAY   amLODipine -benazepril  10-40 MG capsule Commonly known as: LOTREL TAKE 1 CAPSULE BY MOUTH EVERY DAY   aspirin  EC 81 MG tablet Take 1 tablet (81 mg total) by mouth daily. Swallow whole.   azelastine  0.1 % nasal spray Commonly known as:  ASTELIN  Place 1 spray into both nostrils 2 (two) times daily. Use in each nostril as directed   dapagliflozin  propanediol 10 MG Tabs tablet Commonly known as: Farxiga  Take 1 tablet (10 mg total) by mouth daily before breakfast.   Efinaconazole  10 % Soln Apply every day x 48 weeks   ezetimibe  10 MG tablet Commonly known as: ZETIA  TAKE 1 TABLET BY MOUTH EVERY DAY   fluticasone  50 MCG/ACT nasal spray Commonly known as: FLONASE  Place 2 sprays into both nostrils daily.   hydrochlorothiazide  25 MG tablet Commonly known as:  HYDRODIURIL  Take 1 tablet (25 mg total) by mouth daily.   hydroquinone  4 % cream APPLY TO AFFECTED AREA TWICE A DAY   levocetirizine 5 MG tablet Commonly known as: XYZAL  TAKE 1 TABLET BY MOUTH EVERY DAY IN THE EVENING   lidocaine  5 % Commonly known as: LIDODERM  PLACE 1 PATCH ONTO THE SKIN DAILY. REMOVE & DISCARD PATCH WITHIN 12 HOURS OR AS DIRECTED BY MD   meloxicam  15 MG tablet Commonly known as: MOBIC  TAKE 1 TABLET (15 MG TOTAL) BY MOUTH DAILY.   methocarbamol  750 MG tablet Commonly known as: ROBAXIN  TAKE 1 TABLET BY MOUTH 4 TIMES DAILY.   metoprolol  succinate 25 MG 24 hr tablet Commonly known as: TOPROL -XL Take 1 tablet (25 mg total) by mouth daily. Take with or immediately following a meal   Ozempic  (1 MG/DOSE) 4 MG/3ML Sopn Generic drug: Semaglutide  (1 MG/DOSE) 1 mg sq weekly   rosuvastatin  10 MG tablet Commonly known as: CRESTOR  Take 1 tablet (10 mg total) by mouth daily.   sildenafil  100 MG tablet Commonly known as: Viagra  Take 0.5-1 tablets (50-100 mg total) by mouth daily as needed for erectile dysfunction.   spironolactone  25 MG tablet Commonly known as: ALDACTONE  Take 0.5 tablets (12.5 mg total) by mouth daily.         OBJECTIVE:   Vital Signs: BP (!) 144/88   Ht 5' 9 (1.753 m)   Wt 240 lb (108.9 kg)   BMI 35.44 kg/m   Wt Readings from Last 3 Encounters:  10/27/24 240 lb (108.9 kg)  10/12/24 242 lb 9.6 oz (110 kg)   04/09/24 253 lb 9.6 oz (115 kg)     Exam: General: Pt appears well and is in NAD  Lungs: Clear with good BS bilat   Heart: RRR   Abdomen: soft, nontender  Extremities: No pretibial edema.   Neuro: MS is good with appropriate affect, pt is alert and Ox3    DM foot exam: 10/27/2024   The skin of the feet is intact without sores or ulcerations. The pedal pulses are 2+ on right and 2+ on left. The sensation is intact to a screening 5.07, 10 gram monofilament bilaterally    DATA REVIEWED:  Lab Results  Component Value Date   HGBA1C 7.0 (H) 10/12/2024   HGBA1C 8.9 (H) 04/09/2024   HGBA1C 6.3 10/06/2023    Latest Reference Range & Units 10/12/24 18:18  Sodium 135 - 145 mEq/L 141  Potassium 3.5 - 5.1 mEq/L 3.7  Chloride 96 - 112 mEq/L 101  CO2 19 - 32 mEq/L 30  Glucose 70 - 99 mg/dL 91  BUN 6 - 23 mg/dL 25 (H)  Creatinine 9.59 - 1.50 mg/dL 8.56  Calcium  8.4 - 10.5 mg/dL 9.3  Alkaline Phosphatase 39 - 117 U/L 33 (L)  Albumin 3.5 - 5.2 g/dL 4.2  AST 0 - 37 U/L 28  ALT 0 - 53 U/L 35  Total Protein 6.0 - 8.3 g/dL 6.8  Total Bilirubin 0.2 - 1.2 mg/dL 0.5  GFR >39.99 mL/min 53.55 (L)     ASSESSMENT / PLAN / RECOMMENDATIONS:   1) Type 2 Diabetes Mellitus, Optimally controlled, Without complications - Most recent A1c of 7.0 %. Goal A1c < 7.0 %.     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  Neuro/ Feet: Does not have known diabetic peripheral neuropathy .  Renal: Patient does not have known baseline CKD. He   is on an ACEI/ARB at present.    F/U in 6 months     Signed electronically by: Stefano Redgie Butts, MD  [facility] Endocrinology  [facility] [address] [place] [place], [place] [address] Phone: [phone] FAX: [phone]   CC: Antonio Cyndee Jamee JONELLE ROSALEA [address] [place] [address] Phone: [phone]  Fax: [phone]  Return to Endocrinology clinic as below: Future Appointments  Date Time Provider Department Center  10/27/2024 11:50 AM Keyshia Orwick, Donell Redgie, MD [id number] None

## 2024-11-24 ENCOUNTER — Other Ambulatory Visit: Payer: Self-pay | Admitting: Family

## 2024-11-24 DIAGNOSIS — M546 Pain in thoracic spine: Secondary | ICD-10-CM

## 2024-12-26 ENCOUNTER — Other Ambulatory Visit: Payer: Self-pay | Admitting: Family Medicine

## 2024-12-26 DIAGNOSIS — J0140 Acute pansinusitis, unspecified: Secondary | ICD-10-CM

## 2025-01-05 ENCOUNTER — Other Ambulatory Visit: Payer: Self-pay | Admitting: Family Medicine

## 2025-01-05 DIAGNOSIS — I10 Essential (primary) hypertension: Secondary | ICD-10-CM
